# Patient Record
Sex: Female | Born: 1935 | Race: White | Hispanic: No | Smoking: Never smoker
Health system: Southern US, Community
[De-identification: ages and names within clinical notes are randomized; demographics above are authoritative.]

## PROBLEM LIST (undated history)

## (undated) DIAGNOSIS — C801 Malignant (primary) neoplasm, unspecified: Secondary | ICD-10-CM

## (undated) DIAGNOSIS — E785 Hyperlipidemia, unspecified: Secondary | ICD-10-CM

## (undated) DIAGNOSIS — H3530 Unspecified macular degeneration: Secondary | ICD-10-CM

## (undated) DIAGNOSIS — K219 Gastro-esophageal reflux disease without esophagitis: Secondary | ICD-10-CM

## (undated) DIAGNOSIS — E079 Disorder of thyroid, unspecified: Secondary | ICD-10-CM

## (undated) DIAGNOSIS — C439 Malignant melanoma of skin, unspecified: Secondary | ICD-10-CM

## (undated) HISTORY — DX: Gastro-esophageal reflux disease without esophagitis: K21.9

## (undated) HISTORY — DX: Hyperlipidemia, unspecified: E78.5

## (undated) HISTORY — DX: Malignant melanoma of skin, unspecified: C43.9

## (undated) HISTORY — DX: Malignant (primary) neoplasm, unspecified: C80.1

## (undated) HISTORY — PX: OTHER SURGICAL HISTORY: SHX169

## (undated) HISTORY — PX: BUNIONECTOMY: SHX129

## (undated) HISTORY — DX: Unspecified macular degeneration: H35.30

## (undated) HISTORY — PX: MELANOMA EXCISION: SHX5266

## (undated) HISTORY — DX: Disorder of thyroid, unspecified: E07.9

---

## 1981-01-03 HISTORY — PX: AXILLARY NODE DISSECTION: SHX1211

## 2001-05-15 ENCOUNTER — Other Ambulatory Visit: Admission: RE | Admit: 2001-05-15 | Discharge: 2001-05-15 | Payer: Self-pay | Admitting: Internal Medicine

## 2002-04-11 ENCOUNTER — Ambulatory Visit (HOSPITAL_BASED_OUTPATIENT_CLINIC_OR_DEPARTMENT_OTHER): Admission: RE | Admit: 2002-04-11 | Discharge: 2002-04-11 | Payer: Self-pay | Admitting: General Surgery

## 2002-07-01 ENCOUNTER — Other Ambulatory Visit: Admission: RE | Admit: 2002-07-01 | Discharge: 2002-07-01 | Payer: Self-pay | Admitting: Internal Medicine

## 2002-07-01 ENCOUNTER — Encounter: Admission: RE | Admit: 2002-07-01 | Discharge: 2002-07-01 | Payer: Self-pay | Admitting: Internal Medicine

## 2002-07-01 ENCOUNTER — Encounter: Payer: Self-pay | Admitting: Internal Medicine

## 2002-07-30 ENCOUNTER — Encounter: Payer: Self-pay | Admitting: Internal Medicine

## 2002-07-30 ENCOUNTER — Encounter: Admission: RE | Admit: 2002-07-30 | Discharge: 2002-07-30 | Payer: Self-pay | Admitting: Internal Medicine

## 2003-11-06 ENCOUNTER — Encounter: Admission: RE | Admit: 2003-11-06 | Discharge: 2003-11-06 | Payer: Self-pay | Admitting: Internal Medicine

## 2003-11-20 ENCOUNTER — Ambulatory Visit (HOSPITAL_COMMUNITY): Admission: RE | Admit: 2003-11-20 | Discharge: 2003-11-20 | Payer: Self-pay | Admitting: Internal Medicine

## 2005-03-27 ENCOUNTER — Encounter: Admission: RE | Admit: 2005-03-27 | Discharge: 2005-03-27 | Payer: Self-pay | Admitting: Internal Medicine

## 2005-12-13 ENCOUNTER — Other Ambulatory Visit: Admission: RE | Admit: 2005-12-13 | Discharge: 2005-12-13 | Payer: Self-pay | Admitting: Internal Medicine

## 2007-01-30 ENCOUNTER — Other Ambulatory Visit: Admission: RE | Admit: 2007-01-30 | Discharge: 2007-01-30 | Payer: Self-pay | Admitting: Internal Medicine

## 2007-05-31 ENCOUNTER — Encounter: Admission: RE | Admit: 2007-05-31 | Discharge: 2007-05-31 | Payer: Self-pay | Admitting: Internal Medicine

## 2007-06-05 ENCOUNTER — Encounter: Admission: RE | Admit: 2007-06-05 | Discharge: 2007-06-05 | Payer: Self-pay | Admitting: Internal Medicine

## 2007-10-30 ENCOUNTER — Ambulatory Visit: Payer: Self-pay | Admitting: Internal Medicine

## 2008-02-01 ENCOUNTER — Ambulatory Visit: Payer: Self-pay | Admitting: Internal Medicine

## 2008-07-29 ENCOUNTER — Ambulatory Visit: Payer: Self-pay | Admitting: Internal Medicine

## 2008-08-11 ENCOUNTER — Ambulatory Visit: Payer: Self-pay | Admitting: Internal Medicine

## 2008-10-31 ENCOUNTER — Ambulatory Visit: Payer: Self-pay | Admitting: Internal Medicine

## 2009-02-10 ENCOUNTER — Ambulatory Visit: Payer: Self-pay | Admitting: Internal Medicine

## 2009-07-15 ENCOUNTER — Ambulatory Visit: Payer: Self-pay | Admitting: Internal Medicine

## 2009-10-27 ENCOUNTER — Ambulatory Visit: Payer: Self-pay | Admitting: Internal Medicine

## 2009-12-10 ENCOUNTER — Ambulatory Visit: Payer: Self-pay | Admitting: Internal Medicine

## 2010-03-01 ENCOUNTER — Other Ambulatory Visit: Payer: Medicare Other | Admitting: Internal Medicine

## 2010-03-02 ENCOUNTER — Encounter: Payer: Self-pay | Admitting: Internal Medicine

## 2010-03-08 ENCOUNTER — Encounter (INDEPENDENT_AMBULATORY_CARE_PROVIDER_SITE_OTHER): Payer: Medicare Other | Admitting: Internal Medicine

## 2010-03-08 ENCOUNTER — Other Ambulatory Visit (HOSPITAL_COMMUNITY)
Admission: RE | Admit: 2010-03-08 | Discharge: 2010-03-08 | Disposition: A | Discharge status: Home / Self Care | Payer: Medicare Other | Source: Ambulatory Visit | Attending: Internal Medicine | Admitting: Internal Medicine

## 2010-03-08 DIAGNOSIS — K219 Gastro-esophageal reflux disease without esophagitis: Secondary | ICD-10-CM

## 2010-03-08 DIAGNOSIS — M899 Disorder of bone, unspecified: Secondary | ICD-10-CM

## 2010-03-08 DIAGNOSIS — Z124 Encounter for screening for malignant neoplasm of cervix: Secondary | ICD-10-CM | POA: Insufficient documentation

## 2010-03-08 DIAGNOSIS — M949 Disorder of cartilage, unspecified: Secondary | ICD-10-CM

## 2010-03-08 DIAGNOSIS — H8109 Meniere's disease, unspecified ear: Secondary | ICD-10-CM

## 2010-03-08 DIAGNOSIS — E785 Hyperlipidemia, unspecified: Secondary | ICD-10-CM

## 2010-03-08 LAB — HM PAP SMEAR

## 2010-03-10 LAB — HM MAMMOGRAPHY

## 2010-05-21 NOTE — Op Note (Signed)
   NAME:  Sabrina English, Sabrina English                      ACCOUNT NO.:  [id number]   MEDICAL RECORD NO.:  [id number]                   PATIENT TYPE:  AMB   LOCATION:  DSC                                  FACILITY:  [facility]   PHYSICIAN:  Rose Phi. Maple Hudson, M.D.                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   POSTOPERATIVE DIAGNOSIS:  Dysplastic melanoma of the anterior abdominal  wall.   OPERATION:  Wide excision of same.   SURGEON:  Rose Phi. Maple Hudson, M.D.   ANESTHESIA:  Local.   OPERATIVE PROCEDURE:  The patient was placed on the table and the left upper  abdomen prepped and draped in the usual fashion.  An elliptical ellipse was  then outlined with about a 1 cm margin throughout and oriented in a  transverse direction and then we infiltrated the area with a mixture of 1%  Xylocaine and sodium bicarbonate.  The excision was then carried out down  into the subcutaneous tissue and the lesion removed.  We had good  hemostasis.  It was closed with interrupted 4-0 nylon sutures.  The defect  was 5 by 3 cm.  A dressing was then applied.  The patient was transferred to  the recovery room in satisfactory condition.                                               Rose Phi. Maple Hudson, M.D.    PRY/MEDQ  D:  04/11/2002  T:  04/11/2002  Job:  [id number]

## 2010-05-24 ENCOUNTER — Telehealth: Payer: Self-pay | Admitting: *Deleted

## 2010-05-24 NOTE — Telephone Encounter (Signed)
Pt and husband are in [place] visiting family.  Pt has been running a fever of 100 to 102 degrees and coughing.  She visited an urgent care on Thursday where she started a Z-pak.  Pt has now finished antibiotic and has had no improvements in symptoms.  Instructed pt to call doctor that prescribed the antibiotic and let them know she is not feeling any better.  Encouraged her not to fly until her temperature and cough is under control.  Pt verbalized understanding. MD aware.

## 2010-05-27 ENCOUNTER — Ambulatory Visit (INDEPENDENT_AMBULATORY_CARE_PROVIDER_SITE_OTHER): Payer: Medicare Other | Admitting: Internal Medicine

## 2010-05-27 ENCOUNTER — Encounter: Payer: Self-pay | Admitting: Internal Medicine

## 2010-05-27 ENCOUNTER — Other Ambulatory Visit: Payer: Self-pay

## 2010-05-27 VITALS — BP 120/74 | HR 88 | Temp 99.5°F | Wt 155.0 lb

## 2010-05-27 DIAGNOSIS — K121 Other forms of stomatitis: Secondary | ICD-10-CM

## 2010-05-27 DIAGNOSIS — J069 Acute upper respiratory infection, unspecified: Secondary | ICD-10-CM

## 2010-05-27 DIAGNOSIS — B9789 Other viral agents as the cause of diseases classified elsewhere: Secondary | ICD-10-CM

## 2010-05-27 DIAGNOSIS — H8109 Meniere's disease, unspecified ear: Secondary | ICD-10-CM

## 2010-05-27 DIAGNOSIS — M8580 Other specified disorders of bone density and structure, unspecified site: Secondary | ICD-10-CM | POA: Insufficient documentation

## 2010-05-27 DIAGNOSIS — K219 Gastro-esophageal reflux disease without esophagitis: Secondary | ICD-10-CM | POA: Insufficient documentation

## 2010-05-27 DIAGNOSIS — E785 Hyperlipidemia, unspecified: Secondary | ICD-10-CM

## 2010-05-27 MED ORDER — LEVOFLOXACIN 500 MG PO TABS: 500.0000 mg | ORAL_TABLET | Freq: Every day | ORAL | Status: AC

## 2010-05-27 MED ORDER — METHYLPREDNISOLONE ACETATE 80 MG/ML IJ SUSP
80.0000 mg | Freq: Once | INTRAMUSCULAR | Status: AC
  Administered 2010-05-27: 80 mg via INTRAMUSCULAR

## 2010-05-27 MED ORDER — FLUCONAZOLE 150 MG PO TABS: 150.0000 mg | ORAL_TABLET | Freq: Every day | ORAL | Status: AC

## 2010-05-27 NOTE — Patient Instructions (Signed)
Take meds as directed. Return in 3 weeks to have potassium checked

## 2010-05-27 NOTE — Progress Notes (Signed)
  Subjective:    Patient ID: Sabrina English, female    DOB: 15-Sep-1933, 77 y.o.   MRN: [id number]  HPI patient was visiting her son in [place] and came down with an acute respiratory infection. Has had a lot of coughing. Was seen at an urgent care in [place] and given a Zithromax Z-Pak which she has finished. Also lab work was done there and she was told her potassium was low. She takes Dyazide for Mnire's disease. She has been on potassium supplement for 6 days. Complaint of ulcers inside lower lip. Husband is sick with similar illness.    Review of Systems     Objective:   Physical Exam HEENT exam pharynx slightly red small papules inside lower lip TMs clear neck is supple without significant adenopathy chest completely clear to auscultation. No rales or wheezing.       Assessment & Plan:    Acute respiratory infection not responding to Zithromax Z-Pak. Continued coughing and congestion. Treat with Levaquin 500 mg daily or 10 days.  Prior history of stomatitis developing with antibiotic use. Treat prophylactically with Diflucan 150 mg daily for 10 days.  Hycodan tablets #30 one tablet every 6 hours as needed for coughing.  Finish course of potassium supplement and recheck potassium level in 2-3 weeks to see if chronic replacement therapy is needed on diuretic. Also, Depo-Medrol 80 mg given intramuscularly today.

## 2010-06-18 ENCOUNTER — Other Ambulatory Visit: Payer: Self-pay | Admitting: Internal Medicine

## 2010-06-18 ENCOUNTER — Other Ambulatory Visit: Payer: Medicare Other | Admitting: Internal Medicine

## 2010-06-18 DIAGNOSIS — E876 Hypokalemia: Secondary | ICD-10-CM

## 2010-06-19 LAB — BASIC METABOLIC PANEL
BUN: 27 mg/dL — ABNORMAL HIGH (ref 6–23)
CO2: 28 mEq/L (ref 19–32)
Calcium: 9.8 mg/dL (ref 8.4–10.5)
Chloride: 104 mEq/L (ref 96–112)
Creat: 1.09 mg/dL (ref 0.50–1.10)
Glucose, Bld: 93 mg/dL (ref 70–99)
Potassium: 3.6 mEq/L (ref 3.5–5.3)
Sodium: 140 mEq/L (ref 135–145)

## 2010-09-27 ENCOUNTER — Ambulatory Visit (INDEPENDENT_AMBULATORY_CARE_PROVIDER_SITE_OTHER): Payer: Medicare Other | Admitting: Internal Medicine

## 2010-09-27 DIAGNOSIS — Z23 Encounter for immunization: Secondary | ICD-10-CM

## 2011-03-15 ENCOUNTER — Other Ambulatory Visit: Payer: Self-pay | Admitting: Internal Medicine

## 2011-03-15 DIAGNOSIS — Z1231 Encounter for screening mammogram for malignant neoplasm of breast: Secondary | ICD-10-CM | POA: Diagnosis not present

## 2011-03-22 ENCOUNTER — Encounter: Payer: Self-pay | Admitting: Internal Medicine

## 2011-04-26 ENCOUNTER — Other Ambulatory Visit: Payer: Self-pay | Admitting: Internal Medicine

## 2011-05-02 DIAGNOSIS — H2510 Age-related nuclear cataract, unspecified eye: Secondary | ICD-10-CM | POA: Diagnosis not present

## 2011-05-02 DIAGNOSIS — H353190 Nonexudative age-related macular degeneration, unspecified eye, stage unspecified: Secondary | ICD-10-CM | POA: Diagnosis not present

## 2011-06-06 ENCOUNTER — Other Ambulatory Visit: Payer: Medicare Other | Admitting: Internal Medicine

## 2011-06-07 ENCOUNTER — Encounter: Payer: Medicare Other | Admitting: Internal Medicine

## 2011-07-12 ENCOUNTER — Other Ambulatory Visit: Payer: Medicare Other | Admitting: Internal Medicine

## 2011-07-12 DIAGNOSIS — Z79899 Other long term (current) drug therapy: Secondary | ICD-10-CM | POA: Diagnosis not present

## 2011-07-12 DIAGNOSIS — E559 Vitamin D deficiency, unspecified: Secondary | ICD-10-CM

## 2011-07-12 DIAGNOSIS — M8580 Other specified disorders of bone density and structure, unspecified site: Secondary | ICD-10-CM

## 2011-07-12 DIAGNOSIS — E785 Hyperlipidemia, unspecified: Secondary | ICD-10-CM

## 2011-07-12 LAB — COMPREHENSIVE METABOLIC PANEL
ALT: 17 U/L (ref 0–35)
AST: 28 U/L (ref 0–37)
Albumin: 3.9 g/dL (ref 3.5–5.2)
Alkaline Phosphatase: 69 U/L (ref 39–117)
BUN: 27 mg/dL — ABNORMAL HIGH (ref 6–23)
CO2: 31 mEq/L (ref 19–32)
Calcium: 9.9 mg/dL (ref 8.4–10.5)
Chloride: 106 mEq/L (ref 96–112)
Creat: 1.32 mg/dL — ABNORMAL HIGH (ref 0.50–1.10)
Glucose, Bld: 85 mg/dL (ref 70–99)
Potassium: 4 mEq/L (ref 3.5–5.3)
Sodium: 144 mEq/L (ref 135–145)
Total Bilirubin: 0.7 mg/dL (ref 0.3–1.2)
Total Protein: 6.1 g/dL (ref 6.0–8.3)

## 2011-07-12 LAB — LIPID PANEL
Cholesterol: 183 mg/dL (ref 0–200)
HDL: 77 mg/dL (ref >39)
LDL Cholesterol: 89 mg/dL (ref 0–99)
Total CHOL/HDL Ratio: 2.4 Ratio
Triglycerides: 87 mg/dL (ref <150)
VLDL: 17 mg/dL (ref 0–40)

## 2011-07-12 LAB — CBC WITH DIFFERENTIAL/PLATELET
Basophils Absolute: 0 10*3/uL (ref 0.0–0.1)
Basophils Relative: 1 % (ref 0–1)
Eosinophils Absolute: 0.1 10*3/uL (ref 0.0–0.7)
Eosinophils Relative: 3 % (ref 0–5)
HCT: 36.6 % (ref 36.0–46.0)
Hemoglobin: 12.6 g/dL (ref 12.0–15.0)
Lymphocytes Relative: 39 % (ref 12–46)
Lymphs Abs: 1.5 10*3/uL (ref 0.7–4.0)
MCH: 29.2 pg (ref 26.0–34.0)
MCHC: 34.4 g/dL (ref 30.0–36.0)
MCV: 84.9 fL (ref 78.0–100.0)
Monocytes Absolute: 0.4 10*3/uL (ref 0.1–1.0)
Monocytes Relative: 11 % (ref 3–12)
Neutro Abs: 1.8 10*3/uL (ref 1.7–7.7)
Neutrophils Relative %: 46 % (ref 43–77)
Platelets: 245 10*3/uL (ref 150–400)
RBC: 4.31 MIL/uL (ref 3.87–5.11)
RDW: 13.5 % (ref 11.5–15.5)
WBC: 3.8 10*3/uL — ABNORMAL LOW (ref 4.0–10.5)

## 2011-07-12 LAB — TSH: TSH: 0.346 u[IU]/mL — ABNORMAL LOW (ref 0.350–4.500)

## 2011-07-13 LAB — VITAMIN D 25 HYDROXY (VIT D DEFICIENCY, FRACTURES): Vit D, 25-Hydroxy: 47 ng/mL (ref 30–89)

## 2011-07-14 ENCOUNTER — Encounter: Payer: Medicare Other | Admitting: Internal Medicine

## 2011-08-12 ENCOUNTER — Encounter: Payer: Medicare Other | Admitting: Internal Medicine

## 2011-08-15 DIAGNOSIS — H6120 Impacted cerumen, unspecified ear: Secondary | ICD-10-CM | POA: Diagnosis not present

## 2011-08-15 DIAGNOSIS — H903 Sensorineural hearing loss, bilateral: Secondary | ICD-10-CM | POA: Diagnosis not present

## 2011-08-15 DIAGNOSIS — H9190 Unspecified hearing loss, unspecified ear: Secondary | ICD-10-CM | POA: Diagnosis not present

## 2011-08-15 DIAGNOSIS — H61309 Acquired stenosis of external ear canal, unspecified, unspecified ear: Secondary | ICD-10-CM | POA: Diagnosis not present

## 2011-08-16 ENCOUNTER — Encounter: Payer: Self-pay | Admitting: Internal Medicine

## 2011-08-16 ENCOUNTER — Ambulatory Visit (INDEPENDENT_AMBULATORY_CARE_PROVIDER_SITE_OTHER): Payer: Medicare Other | Admitting: Internal Medicine

## 2011-08-16 VITALS — BP 102/64 | HR 80 | Temp 98.5°F | Ht 63.5 in | Wt 158.0 lb

## 2011-08-16 DIAGNOSIS — I10 Essential (primary) hypertension: Secondary | ICD-10-CM | POA: Diagnosis not present

## 2011-08-16 DIAGNOSIS — H8109 Meniere's disease, unspecified ear: Secondary | ICD-10-CM

## 2011-08-16 DIAGNOSIS — Z23 Encounter for immunization: Secondary | ICD-10-CM

## 2011-08-16 DIAGNOSIS — Z0000 Encounter for general adult medical examination without abnormal findings: Secondary | ICD-10-CM

## 2011-08-16 DIAGNOSIS — Z85820 Personal history of malignant melanoma of skin: Secondary | ICD-10-CM

## 2011-08-16 DIAGNOSIS — H3530 Unspecified macular degeneration: Secondary | ICD-10-CM

## 2011-08-16 DIAGNOSIS — M949 Disorder of cartilage, unspecified: Secondary | ICD-10-CM

## 2011-08-16 DIAGNOSIS — K219 Gastro-esophageal reflux disease without esophagitis: Secondary | ICD-10-CM

## 2011-08-16 DIAGNOSIS — E785 Hyperlipidemia, unspecified: Secondary | ICD-10-CM

## 2011-08-16 DIAGNOSIS — M8580 Other specified disorders of bone density and structure, unspecified site: Secondary | ICD-10-CM

## 2011-08-16 DIAGNOSIS — R6889 Other general symptoms and signs: Secondary | ICD-10-CM

## 2011-08-16 LAB — POCT URINALYSIS DIPSTICK
Bilirubin, UA: NEGATIVE
Blood, UA: NEGATIVE
Glucose, UA: NEGATIVE
Ketones, UA: NEGATIVE
Leukocytes, UA: NEGATIVE
Nitrite, UA: NEGATIVE
Spec Grav, UA: 1.02
Urobilinogen, UA: NEGATIVE
pH, UA: 6

## 2011-08-16 LAB — BASIC METABOLIC PANEL
BUN: 30 mg/dL — ABNORMAL HIGH (ref 6–23)
CO2: 31 mEq/L (ref 19–32)
Calcium: 9.8 mg/dL (ref 8.4–10.5)
Chloride: 105 mEq/L (ref 96–112)
Creat: 1.24 mg/dL — ABNORMAL HIGH (ref 0.50–1.10)
Glucose, Bld: 86 mg/dL (ref 70–99)
Potassium: 4 mEq/L (ref 3.5–5.3)
Sodium: 142 mEq/L (ref 135–145)

## 2011-08-16 MED ORDER — TETANUS-DIPHTH-ACELL PERTUSSIS 5-2.5-18.5 LF-MCG/0.5 IM SUSP: 0.5000 mL | Freq: Once | INTRAMUSCULAR | Status: DC

## 2011-08-16 NOTE — Progress Notes (Signed)
Subjective:    Patient ID: Sabrina English, female    DOB: 1933/02/18, 78 y.o.   MRN: [id number]  HPI 78 year old White female with history of Meniere's disease, hearing loss but not using hearing aids, macular degeneration, GERD, hyperlipidemia, osteopenia for evaluation of medical problems and health maintenance. And patient is on Zocor, Lipoflavinoid for Mnire's disease and Dyazide for Mnire's disease. Takes meclizine when necessary for vertigo. History of melanoma left shoulder 1982 treated at [facility] with immune therapy per Dr. Leticia Penna. History of C7 radiculopathy. History of left bunionectomy 1996. Sees Dr. Janae Bridgeman at [facility] for Meniere's disease. Medical issues are stable and health is generally good.  Social history: she is married 3 adult children 2 daughters and a son. Husband is a retired Airline pilot. 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 Does not smoke or consume alcohol.  Family history: Father died at 67 of prostate cancer;  mother died at age 54 of Alzheimer's disease complications. Half sister died at age 60 with chronic lung disease. One sister died of colon cancer at age 57. One half brother in good health. Younger brother in fair health.  No known drug allergies. History of osteopenia and took Fosamax for a while but no longer does so.  Had shingles vaccine 07/29/2008, Pneumovax immunization February 2011, gets annual influenza immunization.    Review of Systems  Constitutional: Negative.   HENT: Positive for tinnitus.   Eyes:       Macular degeneration  Respiratory: Negative.   Cardiovascular: Negative.   Gastrointestinal:       GERD at night sometimes  Genitourinary: Negative.   Musculoskeletal: Negative.   Neurological: Negative.   Hematological: Negative.   Psychiatric/Behavioral: Negative.        Objective:   Physical Exam  Vitals reviewed. Constitutional: She is oriented to person, place, and time. She appears  well-developed and well-nourished. No distress.  HENT:  Head: Normocephalic and atraumatic.  Right Ear: External ear normal.  Left Ear: External ear normal.  Mouth/Throat: Oropharynx is clear and moist. No oropharyngeal exudate.  Eyes: Conjunctivae normal and EOM are normal. Pupils are equal, round, and reactive to light. Right eye exhibits no discharge. Left eye exhibits no discharge.  Neck: Neck supple. No JVD present. No thyromegaly present.  Cardiovascular: Normal rate, regular rhythm, normal heart sounds and intact distal pulses.   No murmur heard. Pulmonary/Chest: Effort normal and breath sounds normal. No respiratory distress. She has no wheezes. She has no rales. She exhibits no tenderness.       Breasts normal female  Abdominal: Soft. Bowel sounds are normal. She exhibits no distension and no mass. There is no tenderness. There is no rebound and no guarding.  Musculoskeletal: Normal range of motion. She exhibits no edema.  Neurological: She is alert and oriented to person, place, and time. She has normal reflexes. No cranial nerve deficit. Coordination normal.  Skin: Skin is warm and dry. No rash noted. She is not diaphoretic.  Psychiatric: She has a normal mood and affect. Her behavior is normal. Judgment and thought content normal.          Assessment & Plan:            Subjective:   Patient presents for Medicare Annual/Subsequent preventive examination.  Review Past  History See Epic   Risk Factors  Current exercise habits: walks occasionally Dietary issues discussed: Low carb low fat  Cardiac risk factors:  Depression Screen  (  Note: if answer to either of the following is "Yes", a more complete depression screening is indicated)   Over the past two weeks, have you felt down, depressed or hopeless? No  Over the past two weeks, have you felt little interest or pleasure in doing things? No Have you lost interest or pleasure in daily life? No Do you often  feel hopeless? No Do you cry easily over simple problems? No   Activities of Daily Living  In your present state of health, do you have any difficulty performing the following activities?:   Driving? No  Managing money? No  Feeding yourself? No  Getting from bed to chair? No  Climbing a flight of stairs? No  Preparing food and eating?: No  Bathing or showering? No  Getting dressed: No  Getting to the toilet? No  Using the toilet:No  Moving around from place to place: No  In the past year have you fallen or had a near fall?:No  Are you sexually active? yes Do you have more than one partner? No   Hearing Difficulties: No  Do you often ask people to speak up or repeat themselves? yes Do you experience ringing or noises in your ears? some Do you have difficulty understanding soft or whispered voices? yes Do you feel that you have a problem with memory? No Do you often misplace items? No    Home Safety:  Do you have a smoke alarm at your residence? Yes Do you have grab bars in the bathroom? No Do you have throw rugs in your house? No   Cognitive Testing  Alert? Yes Normal Appearance?Yes  Oriented to person? Yes Place? Yes  Time? Yes  Recall of three objects? Yes  Can perform simple calculations? Yes  Displays appropriate judgment?Yes  Can read the correct time from a watch face?Yes   List the Names of Other Physician/Practitioners you currently use:  See referral list for the physicians patient is currently seeing. Dr. Janae Bridgeman  at [facility] for Meniere's disease and hearing loss. Opthamologist is Dr. Eulah Pont    Review of Systems:   Objective:     General appearance: Appears stated age  Head: Normocephalic, without obvious abnormality, atraumatic  Eyes: conj clear, EOMi PEERLA  Ears: normal TM's and external ear canals both ears  Nose: Nares normal. Septum midline. Mucosa normal. No drainage or sinus tenderness.  Throat: lips, mucosa, and  tongue normal; teeth and gums normal  Neck: no adenopathy, no carotid bruit, no JVD, supple, symmetrical, trachea midline and thyroid not enlarged, symmetric, no tenderness/mass/nodules  No CVA tenderness.  Lungs: clear to auscultation bilaterally  Breasts: normal appearance, no masses or tenderness,  Heart: regular rate and rhythm, S1, S2 normal, no murmur, click, rub or gallop  Abdomen: soft, non-tender; bowel sounds normal; no masses, no organomegaly  Musculoskeletal: ROM normal in all joints, no crepitus, no deformity, Normal muscle strengthen. Back  is symmetric, no curvature. Skin: Skin color, texture, turgor normal. No rashes or lesions  Lymph nodes: Cervical, supraclavicular, and axillary nodes normal.  Neurologic: CN 2 -12 Normal, Normal symmetric reflexes. Normal coordination and gait  Psych: Alert & Oriented x 3, Mood appear stable.    Assessment:    Annual wellness medicare exam   Plan:    During the course of the visit the patient was educated and counseled about appropriate screening and preventive services including: Annual mammography. Colonoscopy done 04/19/2006 and last Pap smear 03/09/2010  Patient Instructions (the written plan) was given to the patient.  Medicare Attestation  I have personally reviewed:  The patient's medical and social history  Their use of alcohol, tobacco or illicit drugs  Their current medications and supplements  The patient's functional ability including ADLs,fall risks, home safety risks, cognitive, and hearing and visual impairment  Diet and physical activities  Evidence for depression or mood disorders  The patient's weight, height, BMI, and visual acuity have been recorded in the chart. I have made referrals, counseling, and provided education to the patient based on review of the above and I have provided the patient with a written personalized care plan for preventive services.

## 2011-08-30 DIAGNOSIS — M899 Disorder of bone, unspecified: Secondary | ICD-10-CM | POA: Diagnosis not present

## 2011-08-30 DIAGNOSIS — M949 Disorder of cartilage, unspecified: Secondary | ICD-10-CM | POA: Diagnosis not present

## 2011-10-05 ENCOUNTER — Ambulatory Visit (INDEPENDENT_AMBULATORY_CARE_PROVIDER_SITE_OTHER): Payer: Medicare Other | Admitting: Internal Medicine

## 2011-10-05 DIAGNOSIS — Z23 Encounter for immunization: Secondary | ICD-10-CM | POA: Diagnosis not present

## 2011-12-11 ENCOUNTER — Encounter: Payer: Self-pay | Admitting: Internal Medicine

## 2011-12-11 NOTE — Patient Instructions (Addendum)
Continue same medications return in 6 months for office visit lipid panel liver functions in followup

## 2011-12-30 DIAGNOSIS — H353190 Nonexudative age-related macular degeneration, unspecified eye, stage unspecified: Secondary | ICD-10-CM | POA: Diagnosis not present

## 2012-01-16 ENCOUNTER — Encounter: Payer: Self-pay | Admitting: Internal Medicine

## 2012-01-16 ENCOUNTER — Ambulatory Visit (INDEPENDENT_AMBULATORY_CARE_PROVIDER_SITE_OTHER): Payer: Medicare Other | Admitting: Internal Medicine

## 2012-01-16 VITALS — BP 126/66 | HR 74 | Temp 98.0°F | Wt 160.0 lb

## 2012-01-16 DIAGNOSIS — IMO0002 Reserved for concepts with insufficient information to code with codable children: Secondary | ICD-10-CM

## 2012-01-16 DIAGNOSIS — L600 Ingrowing nail: Secondary | ICD-10-CM | POA: Diagnosis not present

## 2012-01-16 NOTE — Patient Instructions (Addendum)
Take Keflex 500 mg 3 times daily for 5 days. Soak toe in warm Epsom salt water for 20 minutes daily. Return on Friday, January 17

## 2012-01-16 NOTE — Progress Notes (Signed)
  Subjective:    Patient ID: Sabrina English, female    DOB: 06-23-1933, 79 y.o.   MRN: [id number]  HPI Painful right great toenail medial aspect. She has been cutting her toenails too short at the edges. Toe has been painful for one month. No purulent drainage noted.    Review of Systems     Objective:   Physical Exam erythema right great toe medial aspect. Very tender to touch. No drainage noted. Cotton inserted under medial aspect of right great toe nail.        Assessment & Plan:  Ingrown right great toenail  Early paronychia right great toe  Plan: Keflex 500 mg 3 times a day for 5 days. Norco 5/325 (#30) 1 by mouth every 8 hours when necessary pain. Take with food. Soak toe in warm Epsom salt water for 20 minutes daily. Return Friday January 17. Says she cannot get the cotton under her toenail herself so I will recheck it then.

## 2012-01-20 ENCOUNTER — Ambulatory Visit (INDEPENDENT_AMBULATORY_CARE_PROVIDER_SITE_OTHER): Payer: Medicare Other | Admitting: Internal Medicine

## 2012-01-20 ENCOUNTER — Encounter: Payer: Self-pay | Admitting: Internal Medicine

## 2012-01-20 VITALS — BP 120/74 | Temp 97.8°F | Wt 160.0 lb

## 2012-01-20 DIAGNOSIS — IMO0002 Reserved for concepts with insufficient information to code with codable children: Secondary | ICD-10-CM | POA: Diagnosis not present

## 2012-01-20 DIAGNOSIS — L600 Ingrowing nail: Secondary | ICD-10-CM

## 2012-01-20 NOTE — Progress Notes (Signed)
  Subjective:    Patient ID: Sabrina English, female    DOB: [birth year], 79 y.o.   MRN: [id number]  HPI followup visit from January 13. Was diagnosed with paronychia and ingrown toenail right great toe. Is doing better. His been on Keflex 500 mg 3 times daily. Less tenderness of right great toe. Cotton still under toe. More cotton inserted up under medial aspect right great toe nail. Some pus was expressed     Review of Systems     Objective:   Physical Exam see above. Less erythema of toe.        Assessment & Plan:    Ingrown toenail right great toe    Paronychia  Plan: continue an additional 5 days of Keflex 500 mg by mouth 3 times a day to complete a ten-day course. She has not been soaking it in warm Epsom salt water as previously advised so I expect her to do that for 20 minutes daily. Call if not better next week or if needs more cotton inserted under nail. Cotton needs to stay under nail under nail has grown out which could be some time. She is reluctant to put the cotton under the nail herself right now.

## 2012-01-20 NOTE — Patient Instructions (Addendum)
Continue Keflex 500 mg 3 times daily for an additional 5 days. Soak right great toe in warm Epsom salt water for 20 minutes daily for 5-7 days. Continue to keep cotton under ingrown toe nail area until nail has grown out.

## 2012-01-24 ENCOUNTER — Telehealth: Payer: Self-pay | Admitting: Internal Medicine

## 2012-02-20 DIAGNOSIS — H61309 Acquired stenosis of external ear canal, unspecified, unspecified ear: Secondary | ICD-10-CM | POA: Diagnosis not present

## 2012-02-20 DIAGNOSIS — H903 Sensorineural hearing loss, bilateral: Secondary | ICD-10-CM | POA: Diagnosis not present

## 2012-02-20 DIAGNOSIS — H6120 Impacted cerumen, unspecified ear: Secondary | ICD-10-CM | POA: Diagnosis not present

## 2012-02-20 DIAGNOSIS — H9190 Unspecified hearing loss, unspecified ear: Secondary | ICD-10-CM | POA: Diagnosis not present

## 2012-03-15 ENCOUNTER — Other Ambulatory Visit: Payer: Self-pay

## 2012-03-15 MED ORDER — TRIAMTERENE-HCTZ 37.5-25 MG PO CAPS: 1.0000 | ORAL_CAPSULE | Freq: Every day | ORAL | Status: DC

## 2012-04-17 ENCOUNTER — Other Ambulatory Visit: Payer: Self-pay | Admitting: Internal Medicine

## 2012-08-20 ENCOUNTER — Other Ambulatory Visit: Payer: Medicare Other | Admitting: Internal Medicine

## 2012-08-20 DIAGNOSIS — Z130 Encounter for screening for diseases of the blood and blood-forming organs and certain disorders involving the immune mechanism: Secondary | ICD-10-CM | POA: Diagnosis not present

## 2012-08-20 DIAGNOSIS — M899 Disorder of bone, unspecified: Secondary | ICD-10-CM | POA: Diagnosis not present

## 2012-08-20 DIAGNOSIS — E785 Hyperlipidemia, unspecified: Secondary | ICD-10-CM | POA: Diagnosis not present

## 2012-08-20 DIAGNOSIS — Z1329 Encounter for screening for other suspected endocrine disorder: Secondary | ICD-10-CM

## 2012-08-20 DIAGNOSIS — Z0000 Encounter for general adult medical examination without abnormal findings: Secondary | ICD-10-CM

## 2012-08-20 DIAGNOSIS — M8580 Other specified disorders of bone density and structure, unspecified site: Secondary | ICD-10-CM

## 2012-08-20 LAB — COMPREHENSIVE METABOLIC PANEL
ALT: 14 U/L (ref 0–35)
AST: 21 U/L (ref 0–37)
Albumin: 3.6 g/dL (ref 3.5–5.2)
Alkaline Phosphatase: 65 U/L (ref 39–117)
BUN: 30 mg/dL — ABNORMAL HIGH (ref 6–23)
CO2: 30 mEq/L (ref 19–32)
Calcium: 9.4 mg/dL (ref 8.4–10.5)
Chloride: 106 mEq/L (ref 96–112)
Creat: 1.2 mg/dL — ABNORMAL HIGH (ref 0.50–1.10)
Glucose, Bld: 94 mg/dL (ref 70–99)
Potassium: 3.7 mEq/L (ref 3.5–5.3)
Sodium: 142 mEq/L (ref 135–145)
Total Bilirubin: 0.7 mg/dL (ref 0.3–1.2)
Total Protein: 5.7 g/dL — ABNORMAL LOW (ref 6.0–8.3)

## 2012-08-20 LAB — LIPID PANEL
Cholesterol: 170 mg/dL (ref 0–200)
HDL: 75 mg/dL (ref >39)
LDL Cholesterol: 78 mg/dL (ref 0–99)
Total CHOL/HDL Ratio: 2.3 Ratio
Triglycerides: 86 mg/dL (ref <150)
VLDL: 17 mg/dL (ref 0–40)

## 2012-08-20 LAB — CBC WITH DIFFERENTIAL/PLATELET
Basophils Absolute: 0 10*3/uL (ref 0.0–0.1)
Basophils Relative: 1 % (ref 0–1)
Eosinophils Absolute: 0.1 10*3/uL (ref 0.0–0.7)
Eosinophils Relative: 2 % (ref 0–5)
HCT: 35.5 % — ABNORMAL LOW (ref 36.0–46.0)
Hemoglobin: 12.1 g/dL (ref 12.0–15.0)
Lymphocytes Relative: 40 % (ref 12–46)
Lymphs Abs: 1.8 10*3/uL (ref 0.7–4.0)
MCH: 28.8 pg (ref 26.0–34.0)
MCHC: 34.1 g/dL (ref 30.0–36.0)
MCV: 84.5 fL (ref 78.0–100.0)
Monocytes Absolute: 0.5 10*3/uL (ref 0.1–1.0)
Monocytes Relative: 10 % (ref 3–12)
Neutro Abs: 2.2 10*3/uL (ref 1.7–7.7)
Neutrophils Relative %: 47 % (ref 43–77)
Platelets: 237 10*3/uL (ref 150–400)
RBC: 4.2 MIL/uL (ref 3.87–5.11)
RDW: 13.4 % (ref 11.5–15.5)
WBC: 4.6 10*3/uL (ref 4.0–10.5)

## 2012-08-20 LAB — TSH: TSH: 0.361 u[IU]/mL (ref 0.350–4.500)

## 2012-08-21 ENCOUNTER — Encounter: Payer: Self-pay | Admitting: Internal Medicine

## 2012-08-21 ENCOUNTER — Ambulatory Visit (INDEPENDENT_AMBULATORY_CARE_PROVIDER_SITE_OTHER): Payer: Medicare Other | Admitting: Internal Medicine

## 2012-08-21 VITALS — BP 124/76 | HR 72 | Temp 98.5°F | Ht 63.5 in | Wt 155.0 lb

## 2012-08-21 DIAGNOSIS — M899 Disorder of bone, unspecified: Secondary | ICD-10-CM | POA: Diagnosis not present

## 2012-08-21 DIAGNOSIS — E785 Hyperlipidemia, unspecified: Secondary | ICD-10-CM | POA: Diagnosis not present

## 2012-08-21 DIAGNOSIS — M8580 Other specified disorders of bone density and structure, unspecified site: Secondary | ICD-10-CM

## 2012-08-21 DIAGNOSIS — Z85820 Personal history of malignant melanoma of skin: Secondary | ICD-10-CM | POA: Diagnosis not present

## 2012-08-21 DIAGNOSIS — Z0000 Encounter for general adult medical examination without abnormal findings: Secondary | ICD-10-CM | POA: Diagnosis not present

## 2012-08-21 DIAGNOSIS — H3530 Unspecified macular degeneration: Secondary | ICD-10-CM

## 2012-08-21 DIAGNOSIS — Z8669 Personal history of other diseases of the nervous system and sense organs: Secondary | ICD-10-CM

## 2012-08-21 DIAGNOSIS — H9190 Unspecified hearing loss, unspecified ear: Secondary | ICD-10-CM

## 2012-08-21 LAB — POCT URINALYSIS DIPSTICK
Ketones, UA: NEGATIVE
Leukocytes, UA: NEGATIVE
Nitrite, UA: NEGATIVE
Protein, UA: NEGATIVE

## 2012-08-21 MED ORDER — MECLIZINE HCL 25 MG PO TABS: 25.0000 mg | ORAL_TABLET | Freq: Three times a day (TID) | ORAL | Status: DC | PRN

## 2012-08-21 MED ORDER — DICYCLOMINE HCL 20 MG PO TABS: 20.0000 mg | ORAL_TABLET | Freq: Four times a day (QID) | ORAL | Status: DC

## 2012-08-21 NOTE — Progress Notes (Signed)
Subjective:    Patient ID: Sabrina English, female    DOB: 1933-06-09, 79 y.o.   MRN: [id number]  HPI  79 year old White female for health maintanance and evaluation of medical problems. History of Meniere's disease , GE reflux, hyperlipidemia,remote history of melanoma, macular degeneration and osteopenia. Sometimes has feeling of esophageal spasm when eating. Food may get hung up but eventually eases. Pt should consider GI evaluation for this.  No known drug allergies.  Takes Zocor for hyperlipidemia, calcium and vitamin D supplements for osteopenia, Maxide 25 for Mnire's disease. Takes when necessary meclizine for vertigo. She takes baby aspirin daily. Takes a potassium supplement because she is on diuretic for Mnire's disease. She has hearing loss but does not use hearing aids.  Patient had melanoma left shoulder 1992 treated at [facility] with immunotherapy per Dr. Leticia Penna.  History of C7 radiculopathy. History of left bunionectomy 1996. Sees Dr. Janae Bridgeman at [facility] for Mnire's disease.  Social history: She is married, has 3 adult children, 2 daughters and a son. Husband is a retired Airline pilot. 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 She does not smoke or consume alcohol.  Family history: Father died at 1 of prostate cancer. Mother died at age 55 of Alzheimer's disease complications. Half-sister died at age 13 with chronic lung disease. One sister died of colon cancer at age 53. One half brother in good health. Younger brother in fair health.  Used to take Fosamax for osteopenia but no longer does so.  Zostavax vaccine done July 2010, Pneumovax immunization February 2011, gets annual influenza immunization.       Review of Systems  HENT:       Macular degeneration and hearing loss  Gastrointestinal:       Some swallowing difficulty recently. History of GE reflux.  Skin:       History of melanoma 1992  Neurological:   History of Mnire's disease       Objective:   Physical Exam  Vitals reviewed. Constitutional: She is oriented to person, place, and time. She appears well-developed and well-nourished. No distress.  HENT:  Head: Normocephalic and atraumatic.  Right Ear: External ear normal.  Left Ear: External ear normal.  Mouth/Throat: Oropharynx is clear and moist.  Eyes: Conjunctivae and EOM are normal. Pupils are equal, round, and reactive to light. Right eye exhibits no discharge. Left eye exhibits no discharge. No scleral icterus.  Neck: Neck supple. No JVD present. No tracheal deviation present. No thyromegaly present.  Cardiovascular: Normal rate, regular rhythm, normal heart sounds and intact distal pulses.   No murmur heard. Pulmonary/Chest: Effort normal and breath sounds normal. No respiratory distress. She has no wheezes. She has no rales. She exhibits no tenderness.  Breasts normal female  Abdominal: Soft. Bowel sounds are normal. She exhibits no distension and no mass. There is no tenderness. There is no rebound and no guarding.  Genitourinary:  Pap not done. Bimanual normal.  Musculoskeletal: Normal range of motion.  Lymphadenopathy:    She has no cervical adenopathy.  Neurological: She is alert and oriented to person, place, and time. She has normal reflexes. No cranial nerve deficit. Coordination normal.   hard of hearing  Skin: Skin is warm and dry. No rash noted. She is not diaphoretic.  Psychiatric: She has a normal mood and affect. Her behavior is normal. Judgment and thought content normal.          Assessment &  Plan:   Hearing loss-does not wear hearing aids  Mnire's disease-stable  Osteopenia-stable  History of macular degeneration  Remote history of melanoma  Hyperlipidemia-on statin therapy  Plan: Return in 6 months for office visit, lipid panel liver functions.      Subjective:   Patient presents for Medicare Annual/Subsequent preventive  examination.   Review Past Medical/Family/Social: no change   Risk Factors  Current exercise habits: Silver Sneakers recently joined Dietary issues discussed: low fat low carb  Cardiac risk factors: see EPIC above  Depression Screen  (Note: if answer to either of the following is "Yes", a more complete depression screening is indicated)   Over the past two weeks, have you felt down, depressed or hopeless? No  Over the past two weeks, have you felt little interest or pleasure in doing things? No Have you lost interest or pleasure in daily life? No Do you often feel hopeless? No Do you cry easily over simple problems? No   Activities of Daily Living  In your present state of health, do you have any difficulty performing the following activities?:   Driving? No  Managing money? No  Feeding yourself? No  Getting from bed to chair? No  Climbing a flight of stairs? No  Preparing food and eating?: No  Bathing or showering? No  Getting dressed: No  Getting to the toilet? No  Using the toilet:No  Moving around from place to place: No  In the past year have you fallen or had a near fall?:No  Are you sexually active? No  Do you have more than one partner? No   Hearing Difficulties: No  Do you often ask people to speak up or repeat themselves? yes Do you experience ringing or noises in your ears? yes Do you have difficulty understanding soft or whispered voices? yes Do you feel that you have a problem with memory? No Do you often misplace items? No    Home Safety:  Do you have a smoke alarm at your residence? Yes Do you have grab bars in the bathroom? no Do you have throw rugs in your house? no   Cognitive Testing  Alert? Yes Normal Appearance?Yes  Oriented to person? Yes Place? Yes  Time? Yes  Recall of three objects? Yes  Can perform simple calculations? Yes  Displays appropriate judgment?Yes  Can read the correct time from a watch face?Yes   List the Names of Other  Physician/Practitioners you currently use:  See referral list for the physicians patient is currently seeing. Dr. Eulah Pont for macular degenration, Dr. Janae Bridgeman for ENT    Review of Systems: see above   Objective:     General appearance: Appears stated age Head: Normocephalic, without obvious abnormality, atraumatic  Eyes: conj clear, EOMi PEERLA  Ears: normal TM's and external ear canals both ears  Nose: Nares normal. Septum midline. Mucosa normal. No drainage or sinus tenderness.  Throat: lips, mucosa, and tongue normal; teeth and gums normal  Neck: no adenopathy, no carotid bruit, no JVD, supple, symmetrical, trachea midline and thyroid not enlarged, symmetric, no tenderness/mass/nodules  No CVA tenderness.  Lungs: clear to auscultation bilaterally  Breasts: normal appearance, no masses or tenderness. Heart: regular rate and rhythm, S1, S2 normal, no murmur, click, rub or gallop  Abdomen: soft, non-tender; bowel sounds normal; no masses, no organomegaly  Musculoskeletal: ROM normal in all joints, no crepitus, no deformity, Normal muscle strengthen. Back  is symmetric, no curvature. Skin: Skin color, texture, turgor normal. No rashes or  lesions  Lymph nodes: Cervical, supraclavicular, and axillary nodes normal.  Neurologic: CN 2 -12 Normal, Normal symmetric reflexes. Normal coordination and gait  Psych: Alert & Oriented x 3, Mood appear stable.    Assessment:    Annual wellness medicare exam   Plan:    During the course of the visit the patient was educated and counseled about appropriate screening and preventive services including:   Mammogram done     Patient Instructions (the written plan) was given to the patient.  Medicare Attestation  I have personally reviewed:  The patient's medical and social history  Their use of alcohol, tobacco or illicit drugs  Their current medications and supplements  The patient's functional ability including ADLs,fall risks, home safety  risks, cognitive, and hearing and visual impairment  Diet and physical activities  Evidence for depression or mood disorders  The patient's weight, height, BMI, and visual acuity have been recorded in the chart. I have made referrals, counseling, and provided education to the patient based on review of the above and I have provided the patient with a written personalized care plan for preventive services.

## 2012-08-24 DIAGNOSIS — H353190 Nonexudative age-related macular degeneration, unspecified eye, stage unspecified: Secondary | ICD-10-CM | POA: Diagnosis not present

## 2012-10-05 ENCOUNTER — Ambulatory Visit (INDEPENDENT_AMBULATORY_CARE_PROVIDER_SITE_OTHER): Payer: Medicare Other | Admitting: Internal Medicine

## 2012-10-05 DIAGNOSIS — Z23 Encounter for immunization: Secondary | ICD-10-CM

## 2012-10-10 ENCOUNTER — Ambulatory Visit: Payer: Medicare Other | Admitting: Internal Medicine

## 2012-10-22 DIAGNOSIS — H903 Sensorineural hearing loss, bilateral: Secondary | ICD-10-CM | POA: Diagnosis not present

## 2012-10-22 DIAGNOSIS — H61309 Acquired stenosis of external ear canal, unspecified, unspecified ear: Secondary | ICD-10-CM | POA: Diagnosis not present

## 2012-10-22 DIAGNOSIS — H918X9 Other specified hearing loss, unspecified ear: Secondary | ICD-10-CM | POA: Diagnosis not present

## 2012-10-22 DIAGNOSIS — H6120 Impacted cerumen, unspecified ear: Secondary | ICD-10-CM | POA: Diagnosis not present

## 2013-02-03 NOTE — Patient Instructions (Addendum)
Continue same medications and return in 6 months. Consider upper GI for swallowing difficulty.

## 2013-02-12 DIAGNOSIS — M21619 Bunion of unspecified foot: Secondary | ICD-10-CM | POA: Diagnosis not present

## 2013-02-12 DIAGNOSIS — M205X9 Other deformities of toe(s) (acquired), unspecified foot: Secondary | ICD-10-CM | POA: Diagnosis not present

## 2013-02-14 DIAGNOSIS — H353190 Nonexudative age-related macular degeneration, unspecified eye, stage unspecified: Secondary | ICD-10-CM | POA: Diagnosis not present

## 2013-02-14 DIAGNOSIS — H2510 Age-related nuclear cataract, unspecified eye: Secondary | ICD-10-CM | POA: Diagnosis not present

## 2013-03-12 ENCOUNTER — Encounter: Payer: Self-pay | Admitting: Internal Medicine

## 2013-03-12 ENCOUNTER — Ambulatory Visit (INDEPENDENT_AMBULATORY_CARE_PROVIDER_SITE_OTHER): Payer: Medicare Other | Admitting: Internal Medicine

## 2013-03-12 VITALS — BP 112/78 | HR 88 | Temp 98.9°F | Wt 144.0 lb

## 2013-03-12 DIAGNOSIS — J069 Acute upper respiratory infection, unspecified: Secondary | ICD-10-CM | POA: Diagnosis not present

## 2013-03-12 DIAGNOSIS — F4321 Adjustment disorder with depressed mood: Secondary | ICD-10-CM

## 2013-03-12 MED ORDER — LEVOFLOXACIN 500 MG PO TABS: 500.0000 mg | ORAL_TABLET | Freq: Every day | ORAL | Status: DC

## 2013-03-12 MED ORDER — TRIAMTERENE-HCTZ 37.5-25 MG PO CAPS: 1.0000 | ORAL_CAPSULE | Freq: Every day | ORAL | Status: DC

## 2013-03-12 MED ORDER — FLUCONAZOLE 150 MG PO TABS
150.0000 mg | ORAL_TABLET | Freq: Once | ORAL | Status: DC
Start: 2013-03-12 — End: 2014-03-10

## 2013-03-12 NOTE — Progress Notes (Signed)
   Subjective:    Patient ID: Sabrina English, female    DOB: [birth year], 80 y.o.   MRN: [id number]  HPI Onset March 5th of URI symptoms. Has had fever up to 100. No shaking chills. No myalgias. Has had cough with thick white sputum production. No sore throat. Husband passed away a few months ago. Still grieving. Not sleeping well. Tearful in the office today. History of stomatitis when taking antibiotics. Last URI on file 2012.    Review of Systems     Objective:   Physical Exam Skin warm and dry. Nodes none. Hard of hearing. Pharynx is clear. TMs obscured by cerumen. Neck is supple. Chest clear without rales or wheezing.        Assessment & Plan:  Acute URI  Grief reaction  Plan: Levaquin 500 milligrams daily for 7 days. Diflucan 150 mg tablet one time dose to take if develops stomatitis while on antibiotics. Has Hycodan at home for cough.

## 2013-03-12 NOTE — Patient Instructions (Addendum)
Take Levaquin as directed x 7 days. Take Diflucan if needed for stomatitis from antibiotic. Take Hycodan as needed for cough.

## 2013-03-21 ENCOUNTER — Other Ambulatory Visit: Payer: Self-pay

## 2013-03-21 MED ORDER — SIMVASTATIN 20 MG PO TABS: 20.0000 mg | ORAL_TABLET | Freq: Every day | ORAL | Status: DC

## 2013-04-04 DIAGNOSIS — Z1231 Encounter for screening mammogram for malignant neoplasm of breast: Secondary | ICD-10-CM | POA: Diagnosis not present

## 2013-05-16 DIAGNOSIS — M24549 Contracture, unspecified hand: Secondary | ICD-10-CM | POA: Diagnosis not present

## 2013-05-16 DIAGNOSIS — M2010 Hallux valgus (acquired), unspecified foot: Secondary | ICD-10-CM | POA: Diagnosis not present

## 2013-05-16 DIAGNOSIS — G8918 Other acute postprocedural pain: Secondary | ICD-10-CM | POA: Diagnosis not present

## 2013-05-16 DIAGNOSIS — M21619 Bunion of unspecified foot: Secondary | ICD-10-CM | POA: Diagnosis not present

## 2013-05-16 DIAGNOSIS — M2040 Other hammer toe(s) (acquired), unspecified foot: Secondary | ICD-10-CM | POA: Diagnosis not present

## 2013-05-16 DIAGNOSIS — M205X9 Other deformities of toe(s) (acquired), unspecified foot: Secondary | ICD-10-CM | POA: Diagnosis not present

## 2013-06-24 DIAGNOSIS — H6120 Impacted cerumen, unspecified ear: Secondary | ICD-10-CM | POA: Diagnosis not present

## 2013-06-24 DIAGNOSIS — H903 Sensorineural hearing loss, bilateral: Secondary | ICD-10-CM | POA: Diagnosis not present

## 2013-06-24 DIAGNOSIS — H9319 Tinnitus, unspecified ear: Secondary | ICD-10-CM | POA: Diagnosis not present

## 2013-06-24 DIAGNOSIS — H61309 Acquired stenosis of external ear canal, unspecified, unspecified ear: Secondary | ICD-10-CM | POA: Diagnosis not present

## 2013-06-24 DIAGNOSIS — H918X9 Other specified hearing loss, unspecified ear: Secondary | ICD-10-CM | POA: Diagnosis not present

## 2013-08-15 DIAGNOSIS — H353190 Nonexudative age-related macular degeneration, unspecified eye, stage unspecified: Secondary | ICD-10-CM | POA: Diagnosis not present

## 2013-09-26 DIAGNOSIS — H5315 Visual distortions of shape and size: Secondary | ICD-10-CM | POA: Diagnosis not present

## 2013-09-26 DIAGNOSIS — H259 Unspecified age-related cataract: Secondary | ICD-10-CM | POA: Diagnosis not present

## 2013-09-26 DIAGNOSIS — H3530 Unspecified macular degeneration: Secondary | ICD-10-CM | POA: Diagnosis not present

## 2013-09-26 DIAGNOSIS — H43819 Vitreous degeneration, unspecified eye: Secondary | ICD-10-CM | POA: Diagnosis not present

## 2013-09-30 DIAGNOSIS — M2010 Hallux valgus (acquired), unspecified foot: Secondary | ICD-10-CM | POA: Diagnosis not present

## 2013-09-30 DIAGNOSIS — M21619 Bunion of unspecified foot: Secondary | ICD-10-CM | POA: Diagnosis not present

## 2013-10-01 DIAGNOSIS — H2510 Age-related nuclear cataract, unspecified eye: Secondary | ICD-10-CM | POA: Diagnosis not present

## 2013-10-01 DIAGNOSIS — H25049 Posterior subcapsular polar age-related cataract, unspecified eye: Secondary | ICD-10-CM | POA: Diagnosis not present

## 2013-10-01 DIAGNOSIS — H5700 Unspecified anomaly of pupillary function: Secondary | ICD-10-CM | POA: Diagnosis not present

## 2013-10-01 DIAGNOSIS — H25039 Anterior subcapsular polar age-related cataract, unspecified eye: Secondary | ICD-10-CM | POA: Diagnosis not present

## 2013-10-23 ENCOUNTER — Telehealth: Payer: Self-pay

## 2013-10-23 ENCOUNTER — Ambulatory Visit (INDEPENDENT_AMBULATORY_CARE_PROVIDER_SITE_OTHER): Payer: Medicare Other | Admitting: Internal Medicine

## 2013-10-23 DIAGNOSIS — Z23 Encounter for immunization: Secondary | ICD-10-CM | POA: Diagnosis not present

## 2013-10-23 NOTE — Telephone Encounter (Signed)
Patient was wondering if she needed prevnar 13.  She had 23 in 2011.  Please advise.

## 2013-10-23 NOTE — Telephone Encounter (Signed)
May come get Prevnar.

## 2013-10-24 NOTE — Telephone Encounter (Signed)
Patient will get prevnar Wed Nov 4 at 1115.

## 2013-10-29 DIAGNOSIS — H21562 Pupillary abnormality, left eye: Secondary | ICD-10-CM | POA: Diagnosis not present

## 2013-10-29 DIAGNOSIS — H25042 Posterior subcapsular polar age-related cataract, left eye: Secondary | ICD-10-CM | POA: Diagnosis not present

## 2013-10-29 DIAGNOSIS — H2512 Age-related nuclear cataract, left eye: Secondary | ICD-10-CM | POA: Diagnosis not present

## 2013-10-29 DIAGNOSIS — H25032 Anterior subcapsular polar age-related cataract, left eye: Secondary | ICD-10-CM | POA: Diagnosis not present

## 2013-10-29 DIAGNOSIS — H25012 Cortical age-related cataract, left eye: Secondary | ICD-10-CM | POA: Diagnosis not present

## 2013-11-06 ENCOUNTER — Ambulatory Visit (INDEPENDENT_AMBULATORY_CARE_PROVIDER_SITE_OTHER): Payer: Medicare Other | Admitting: Internal Medicine

## 2013-11-06 DIAGNOSIS — Z23 Encounter for immunization: Secondary | ICD-10-CM

## 2013-11-06 MED ORDER — PNEUMOCOCCAL 13-VAL CONJ VACC IM SUSP
0.5000 mL | Freq: Once | INTRAMUSCULAR | Status: AC
  Administered 2013-11-06: 0.5 mL via INTRAMUSCULAR

## 2013-11-06 MED ORDER — PNEUMOCOCCAL 13-VAL CONJ VACC IM SUSP: 0.5000 mL | INTRAMUSCULAR | Status: DC

## 2014-01-15 ENCOUNTER — Encounter: Payer: Self-pay | Admitting: Internal Medicine

## 2014-02-13 DIAGNOSIS — H43813 Vitreous degeneration, bilateral: Secondary | ICD-10-CM | POA: Diagnosis not present

## 2014-02-13 DIAGNOSIS — H3531 Nonexudative age-related macular degeneration: Secondary | ICD-10-CM | POA: Diagnosis not present

## 2014-03-08 ENCOUNTER — Other Ambulatory Visit: Payer: Self-pay | Admitting: Internal Medicine

## 2014-03-10 ENCOUNTER — Ambulatory Visit (INDEPENDENT_AMBULATORY_CARE_PROVIDER_SITE_OTHER): Payer: Medicare Other | Admitting: Internal Medicine

## 2014-03-10 ENCOUNTER — Telehealth: Payer: Self-pay | Admitting: *Deleted

## 2014-03-10 ENCOUNTER — Encounter: Payer: Self-pay | Admitting: Internal Medicine

## 2014-03-10 ENCOUNTER — Ambulatory Visit (HOSPITAL_COMMUNITY)
Admission: RE | Admit: 2014-03-10 | Discharge: 2014-03-10 | Disposition: A | Discharge status: Home / Self Care | Payer: Medicare Other | Source: Ambulatory Visit | Attending: Internal Medicine | Admitting: Internal Medicine

## 2014-03-10 VITALS — BP 128/74 | HR 84 | Temp 97.8°F | Wt 153.0 lb

## 2014-03-10 DIAGNOSIS — M79652 Pain in left thigh: Secondary | ICD-10-CM

## 2014-03-10 DIAGNOSIS — M79605 Pain in left leg: Secondary | ICD-10-CM | POA: Diagnosis not present

## 2014-03-10 DIAGNOSIS — M79602 Pain in left arm: Secondary | ICD-10-CM

## 2014-03-10 NOTE — Progress Notes (Signed)
   Subjective:    Patient ID: Sabrina English, female    DOB: April 10, 1933, 81 y.o.   MRN: [id number]  HPI  Patient has been going to the [facility] riding a stationary bicycle for an hour to 3 times a week. About 3 weeks ago she developed pain in her left inner thigh area. Says it hurts to raise her leg upwards. Denies any fall or injury to the left lower extremity. No weakness.    Review of Systems     Objective:   Physical Exam  She has palpable thickening in left medial thigh that is tender to touch. No erythema. Vascular ultrasound revealed no evidence of DVT      Assessment & Plan:  Musculoskeletal pain left medial 5  Plan: Patient may take Aleve, use warm hot compresses 20 minutes twice daily, do not exercise until symptoms improve, recommend physical therapy.

## 2014-03-10 NOTE — Telephone Encounter (Signed)
Patient will try aleve and warm compresses she will call us back in 1 week if no improvement she will agree to physical therapy

## 2014-03-10 NOTE — Progress Notes (Signed)
VASCULAR LAB PRELIMINARY  PRELIMINARY  PRELIMINARY  PRELIMINARY  Left lower extremity venous Doppler completed.    Preliminary report:  There is no DVT or SVT noted in the left lower extremity.   Sukhman Kocher, RVT 03/10/2014, 2:44 PM

## 2014-03-10 NOTE — Patient Instructions (Signed)
We recommend physical therapy. Take Aleve. Warm hot compresses 20 minutes twice daily. Do not go exercise until pain has resolved.

## 2014-04-03 ENCOUNTER — Other Ambulatory Visit: Payer: Self-pay | Admitting: Internal Medicine

## 2014-04-28 DIAGNOSIS — H918X3 Other specified hearing loss, bilateral: Secondary | ICD-10-CM | POA: Diagnosis not present

## 2014-04-28 DIAGNOSIS — J342 Deviated nasal septum: Secondary | ICD-10-CM | POA: Diagnosis not present

## 2014-04-28 DIAGNOSIS — H903 Sensorineural hearing loss, bilateral: Secondary | ICD-10-CM | POA: Diagnosis not present

## 2014-04-28 DIAGNOSIS — H6123 Impacted cerumen, bilateral: Secondary | ICD-10-CM | POA: Diagnosis not present

## 2014-04-28 DIAGNOSIS — M270 Developmental disorders of jaws: Secondary | ICD-10-CM | POA: Diagnosis not present

## 2014-04-28 DIAGNOSIS — H61301 Acquired stenosis of right external ear canal, unspecified: Secondary | ICD-10-CM | POA: Diagnosis not present

## 2014-05-07 ENCOUNTER — Ambulatory Visit (INDEPENDENT_AMBULATORY_CARE_PROVIDER_SITE_OTHER): Payer: Medicare Other | Admitting: Physician Assistant

## 2014-05-07 ENCOUNTER — Ambulatory Visit (INDEPENDENT_AMBULATORY_CARE_PROVIDER_SITE_OTHER): Payer: Medicare Other

## 2014-05-07 VITALS — BP 110/60 | HR 94 | Temp 98.5°F | Resp 16 | Ht 63.0 in | Wt 152.0 lb

## 2014-05-07 DIAGNOSIS — R059 Cough, unspecified: Secondary | ICD-10-CM

## 2014-05-07 DIAGNOSIS — R05 Cough: Secondary | ICD-10-CM | POA: Diagnosis not present

## 2014-05-07 DIAGNOSIS — R509 Fever, unspecified: Secondary | ICD-10-CM

## 2014-05-07 DIAGNOSIS — J189 Pneumonia, unspecified organism: Secondary | ICD-10-CM | POA: Diagnosis not present

## 2014-05-07 LAB — POCT CBC
Granulocyte percent: 80.6 %G — AB (ref 37–80)
HCT, POC: 37.4 % — AB (ref 37.7–47.9)
Hemoglobin: 12.6 g/dL (ref 12.2–16.2)
LYMPH, POC: 1.3 (ref 0.6–3.4)
MCH, POC: 28.5 pg (ref 27–31.2)
MCHC: 33.6 g/dL (ref 31.8–35.4)
MCV: 84.8 fL (ref 80–97)
MID (cbc): 0.9 (ref 0–0.9)
MPV: 6.3 fL (ref 0–99.8)
POC GRANULOCYTE: 8.9 — AB (ref 2–6.9)
POC LYMPH PERCENT: 11.4 %L (ref 10–50)
POC MID %: 8 % (ref 0–12)
Platelet Count, POC: 294 10*3/uL (ref 142–424)
RBC: 4.41 M/uL (ref 4.04–5.48)
RDW, POC: 13.6 %
WBC: 11 10*3/uL — AB (ref 4.6–10.2)

## 2014-05-07 LAB — COMPREHENSIVE METABOLIC PANEL
ALK PHOS: 77 U/L (ref 39–117)
ALT: 13 U/L (ref 0–35)
AST: 17 U/L (ref 0–37)
Albumin: 3.7 g/dL (ref 3.5–5.2)
BUN: 19 mg/dL (ref 6–23)
CO2: 27 mEq/L (ref 19–32)
Calcium: 9.4 mg/dL (ref 8.4–10.5)
Chloride: 101 mEq/L (ref 96–112)
Creat: 1.14 mg/dL — ABNORMAL HIGH (ref 0.50–1.10)
Glucose, Bld: 100 mg/dL — ABNORMAL HIGH (ref 70–99)
POTASSIUM: 4 meq/L (ref 3.5–5.3)
SODIUM: 140 meq/L (ref 135–145)
TOTAL PROTEIN: 6.5 g/dL (ref 6.0–8.3)
Total Bilirubin: 0.8 mg/dL (ref 0.2–1.2)

## 2014-05-07 MED ORDER — CEFTRIAXONE SODIUM 1 G IJ SOLR
1.0000 g | Freq: Once | INTRAMUSCULAR | Status: AC
  Administered 2014-05-07: 1 g via INTRAMUSCULAR

## 2014-05-07 MED ORDER — HYDROCODONE-HOMATROPINE 5-1.5 MG/5ML PO SYRP: 5.0000 mL | ORAL_SOLUTION | Freq: Three times a day (TID) | ORAL | Status: DC | PRN

## 2014-05-07 MED ORDER — LEVOFLOXACIN 500 MG PO TABS: 500.0000 mg | ORAL_TABLET | Freq: Every day | ORAL | Status: AC

## 2014-05-07 NOTE — Patient Instructions (Addendum)
Continue to drink copious fluids.    Pneumonia Pneumonia is an infection of the lungs.  CAUSES Pneumonia may be caused by bacteria or a virus. Usually, these infections are caused by breathing infectious particles into the lungs (respiratory tract). SIGNS AND SYMPTOMS   Cough.  Fever.  Chest pain.  Increased rate of breathing.  Wheezing.  Mucus production. DIAGNOSIS  If you have the common symptoms of pneumonia, your health care provider will typically confirm the diagnosis with a chest X-ray. The X-ray will show an abnormality in the lung (pulmonary infiltrate) if you have pneumonia. Other tests of your blood, urine, or sputum may be done to find the specific cause of your pneumonia. Your health care provider may also do tests (blood gases or pulse oximetry) to see how well your lungs are working. TREATMENT  Some forms of pneumonia may be spread to other people when you cough or sneeze. You may be asked to wear a mask before and during your exam. Pneumonia that is caused by bacteria is treated with antibiotic medicine. Pneumonia that is caused by the influenza virus may be treated with an antiviral medicine. Most other viral infections must run their course. These infections will not respond to antibiotics.  HOME CARE INSTRUCTIONS   Cough suppressants may be used if you are losing too much rest. However, coughing protects you by clearing your lungs. You should avoid using cough suppressants if you can.  Your health care provider may have prescribed medicine if he or she thinks your pneumonia is caused by bacteria or influenza. Finish your medicine even if you start to feel better.  Your health care provider may also prescribe an expectorant. This loosens the mucus to be coughed up.  Take medicines only as directed by your health care provider.  Do not smoke. Smoking is a common cause of bronchitis and can contribute to pneumonia. If you are a smoker and continue to smoke, your cough  may last several weeks after your pneumonia has cleared.  A cold steam vaporizer or humidifier in your room or home may help loosen mucus.  Coughing is often worse at night. Sleeping in a semi-upright position in a recliner or using a couple pillows under your head will help with this.  Get rest as you feel it is needed. Your body will usually let you know when you need to rest. PREVENTION A pneumococcal shot (vaccine) is available to prevent a common bacterial cause of pneumonia. This is usually suggested for:  People over 73 years old.  Patients on chemotherapy.  People with chronic lung problems, such as bronchitis or emphysema.  People with immune system problems. If you are over 65 or have a high risk condition, you may receive the pneumococcal vaccine if you have not received it before. In some countries, a routine influenza vaccine is also recommended. This vaccine can help prevent some cases of pneumonia.You may be offered the influenza vaccine as part of your care. If you smoke, it is time to quit. You may receive instructions on how to stop smoking. Your health care provider can provide medicines and counseling to help you quit. SEEK MEDICAL CARE IF: You have a fever. SEEK IMMEDIATE MEDICAL CARE IF:   Your illness becomes worse. This is especially true if you are elderly or weakened from any other disease.  You cannot control your cough with suppressants and are losing sleep.  You begin coughing up blood.  You develop pain which is getting worse or is  uncontrolled with medicines.  Any of the symptoms which initially brought you in for treatment are getting worse rather than better.  You develop shortness of breath or chest pain. MAKE SURE YOU:   Understand these instructions.  Will watch your condition.  Will get help right away if you are not doing well or get worse. Document Released: 12/20/2004 Document Revised: 05/06/2013 Document Reviewed:  03/11/2010 [facility] Patient Information 2015 [place], [place]. This information is not intended to replace advice given to you by your health care provider. Make sure you discuss any questions you have with your health care provider.

## 2014-05-07 NOTE — Progress Notes (Signed)
05/07/2014 at 12:34 PM  Sabrina English / DOB: Jun 17, 1933 / MRN: [id number]  The patient has Meniere disease; Hyperlipidemia; GE reflux; Osteopenia; and Macular degeneration of both eyes on her problem list.  SUBJECTIVE  Chief complaint: Cough and Fever   Sabrina English is a 81 y.o. female complaining of fevers up to 101 degrees, chest congestion, chills, and productive cough that started 3 days ago.  Associated symptoms include difficulty breathing today, and she denies runny nose, sore throat and headache.The patient feels her symptoms are improving. Treatments tried thus far include ibuprofen with fair  relief. She denies sick contacts. She had the prevnar 13 six  months previous. She has had the seasonal flu shot.    She  has a past medical history of Hyperlipidemia; GERD (gastroesophageal reflux disease); Osteoporosis; Meniere's disease; and Cancer.    Medications reviewed and updated by myself where necessary, and exist elsewhere in the encounter.   Sabrina English has No Known Allergies. She  reports that she has never smoked. She has never used smokeless tobacco. She reports that she does not use illicit drugs. She  has no sexual activity history on file. The patient  has past surgical history that includes Melanoma excision; Axillary node dissection (1983); and Bunionectomy.  Her family history includes Cancer in her father and sister; Hyperlipidemia in her mother; Mental illness in her mother.  Review of Systems  Constitutional: Negative for weight loss.  Respiratory: Negative for hemoptysis and shortness of breath.   Cardiovascular: Negative for chest pain.  Gastrointestinal: Negative for nausea, abdominal pain and diarrhea.  Genitourinary: Negative for dysuria, urgency, frequency and hematuria.  Musculoskeletal: Negative for myalgias.  Neurological: Negative for dizziness and headaches.    OBJECTIVE  Her  height is 5\' 3"  (1.6 m) and weight is 152 lb (68.947 kg). Her  oral temperature is 98.5 F (36.9 C). Her blood pressure is 110/60 and her pulse is 94. Her respiration is 16 and oxygen saturation is 95%.  The patient's body mass index is 26.93 kg/(m^2).  Physical Exam  Constitutional: She is oriented to person, place, and time. She appears well-developed and well-nourished. No distress.  HENT:  Right Ear: Hearing, tympanic membrane, external ear and ear canal normal.  Left Ear: Hearing, tympanic membrane, external ear and ear canal normal.  Nose: No mucosal edema. Right sinus exhibits no maxillary sinus tenderness and no frontal sinus tenderness. Left sinus exhibits no maxillary sinus tenderness and no frontal sinus tenderness.  Mouth/Throat: Uvula is midline, oropharynx is clear and moist and mucous membranes are normal.  Cardiovascular: Normal rate, regular rhythm and normal heart sounds.   Respiratory: Effort normal and breath sounds normal. She has no wheezes. She has no rales.  Neurological: She is alert and oriented to person, place, and time.  Skin: Skin is warm and dry. She is not diaphoretic.  Psychiatric: She has a normal mood and affect.    Results for orders placed or performed in visit on 05/07/14 (from the past 24 hour(s))  POCT CBC     Status: Abnormal   Collection Time: 05/07/14 11:15 AM  Result Value Ref Range   WBC 11.0 (A) 4.6 - 10.2 K/uL   Lymph, poc 1.3 0.6 - 3.4   POC LYMPH PERCENT 11.4 10 - 50 %L   MID (cbc) 0.9 0 - 0.9   POC MID % 8.0 0 - 12 %M   POC Granulocyte 8.9 (A) 2 - 6.9   Granulocyte percent 80.6 (A) 37 -  80 %G   RBC 4.41 4.04 - 5.48 M/uL   Hemoglobin 12.6 12.2 - 16.2 g/dL   HCT, POC 37.4 (A) 37.7 - 47.9 %   MCV 84.8 80 - 97 fL   MCH, POC 28.5 27 - 31.2 pg   MCHC 33.6 31.8 - 35.4 g/dL   RDW, POC 13.6 %   Platelet Count, POC 294 142 - 424 K/uL   MPV 6.3 0 - 99.8 fL   [facility] reading (PRIMARY) by  Dr. Danielle Rankin: Infiltrate noted on right lower lung field and along the left lingula.    ASSESSMENT & PLAN  Sabrina English was  seen today for cough and fever.  Diagnoses and all orders for this visit:  Cough Orders: -     POCT CBC -     DG Chest 2 View; Future -     HYDROcodone-homatropine (HYCODAN) 5-1.5 MG/5ML syrup; Take 5 mLs by mouth every 8 (eight) hours as needed for cough.  Fever, unspecified fever cause Orders: -     POCT CBC -     DG Chest 2 View; Future  Pneumonia, organism unspecified: Patient to follow up with Dr. Everlene Farrier on this coming Saturday.  She is to return to the clinic sooner, or go to the ED, if she becomes worse.  Advised that she continue vigorous po hydration.  Levo down titrated due to estimated CrCL of 48.   Orders: -     cefTRIAXone (ROCEPHIN) injection 1 g; Inject 1 g into the muscle once. -     levofloxacin (LEVAQUIN) 500 MG tablet; Take 1 tablet (500 mg total) by mouth daily.    The patient was advised to call or come back to clinic if she does not see an improvement in symptoms, or worsens with the above plan.   Philis Fendt, MHS, PA-C [facility] 05/07/2014 12:34 PM

## 2014-05-10 ENCOUNTER — Ambulatory Visit (INDEPENDENT_AMBULATORY_CARE_PROVIDER_SITE_OTHER): Payer: Medicare Other | Admitting: Emergency Medicine

## 2014-05-10 ENCOUNTER — Ambulatory Visit (INDEPENDENT_AMBULATORY_CARE_PROVIDER_SITE_OTHER): Payer: Medicare Other

## 2014-05-10 VITALS — BP 110/64 | HR 81 | Temp 98.0°F | Resp 16 | Ht 64.0 in | Wt 151.8 lb

## 2014-05-10 DIAGNOSIS — J189 Pneumonia, unspecified organism: Secondary | ICD-10-CM

## 2014-05-10 DIAGNOSIS — R05 Cough: Secondary | ICD-10-CM

## 2014-05-10 DIAGNOSIS — R059 Cough, unspecified: Secondary | ICD-10-CM

## 2014-05-10 NOTE — Progress Notes (Addendum)
   Subjective:  This chart was scribed for Nena Jordan, MD by [facility], medical scribe at [facility].The patient was seen in exam room 10 and the patient's care was started at 10:31 AM.   Patient ID: Sabrina English, female    DOB: 06-Jan-1933, 81 y.o.   MRN: [id number] Chief Complaint  Patient presents with  . Follow-up   HPI  HPI Comments: Sabrina English is a 81 y.o. female who presents to [facility] for a follow up. Pt was diagnosed with pneumonia on 05/07/2014. She was given a 1 gram Rocephin injection IM and Levaquin 500 mg once daily. She still has a cough that is producing green phlegm. Pt states she has an intermittent fever. She denies shortness of breath.   Review of Systems  Constitutional: Positive for fever.  Respiratory: Positive for cough. Negative for shortness of breath.       Objective:  BP 110/64 mmHg  Pulse 81  Temp(Src) 98 F (36.7 C) (Oral)  Resp 16  Ht 5\' 4"  (1.626 m)  Wt 151 lb 12.8 oz (68.856 kg)  BMI 26.04 kg/m2  SpO2 95% Physical Exam  Nursing note and vitals reviewed. CONSTITUTIONAL: Well developed/well nourished HEAD: Normocephalic/atraumatic EYES: EOMI/PERRL ENMT: Mucous membranes moist NECK: supple no meningeal signs SPINE/BACK:entire spine nontender CV: S1/S2 noted, no murmurs/rubs/gallops noted LUNGS: Lungs are clear to auscultation bilaterally, no apparent distress, few crackles both bases but good air exchange.  ABDOMEN: soft, nontender, no rebound or guarding, bowel sounds noted throughout abdomen GU:no cva tenderness NEURO: Pt is awake/alert/appropriate, moves all extremitiesx4.  No facial droop.   EXTREMITIES: pulses normal/equal, full ROM SKIN: warm, color normal PSYCH: no abnormalities of mood noted, alert and oriented to situation.   [facility] reading (PRIMARY) by  Dr. Everlene Farrier the infiltrate on the left is better the infiltrate on the right is about the same.  Assessment & Plan:    1.Pneumonia  organism unspecified - DG Chest 2 View; Future chest x-ray looks improved to me she will finish out her antibiotics and repeat film in 7-10 days the radiologist felt like the x-ray looked about the same. 2. Cough No  Change in meds   I personally performed the services described in this documentation, which was scribed in my presence. The recorded information has been reviewed and is accurate.  Arlyss Queen, MD  [facility]  05/10/2014 11:36 AM

## 2014-05-18 ENCOUNTER — Ambulatory Visit (INDEPENDENT_AMBULATORY_CARE_PROVIDER_SITE_OTHER): Payer: Medicare Other | Admitting: Emergency Medicine

## 2014-05-18 ENCOUNTER — Ambulatory Visit (INDEPENDENT_AMBULATORY_CARE_PROVIDER_SITE_OTHER): Payer: Medicare Other

## 2014-05-18 VITALS — BP 128/66 | HR 66 | Temp 97.3°F | Resp 14 | Ht 63.25 in | Wt 150.4 lb

## 2014-05-18 DIAGNOSIS — R059 Cough, unspecified: Secondary | ICD-10-CM

## 2014-05-18 DIAGNOSIS — R9389 Abnormal findings on diagnostic imaging of other specified body structures: Secondary | ICD-10-CM

## 2014-05-18 DIAGNOSIS — C439 Malignant melanoma of skin, unspecified: Secondary | ICD-10-CM | POA: Diagnosis not present

## 2014-05-18 DIAGNOSIS — J189 Pneumonia, unspecified organism: Secondary | ICD-10-CM

## 2014-05-18 DIAGNOSIS — R938 Abnormal findings on diagnostic imaging of other specified body structures: Secondary | ICD-10-CM

## 2014-05-18 DIAGNOSIS — R05 Cough: Secondary | ICD-10-CM | POA: Diagnosis not present

## 2014-05-18 MED ORDER — CLOTRIMAZOLE 10 MG MT TROC: 10.0000 mg | Freq: Every day | OROMUCOSAL | Status: DC

## 2014-05-18 NOTE — Addendum Note (Signed)
Addended by: Arlyss Queen A on: 05/18/2014 11:58 AM   Modules accepted: Orders

## 2014-05-18 NOTE — Addendum Note (Signed)
Addended by: Arlyss Queen A on: 05/18/2014 04:55 PM   Modules accepted: Orders

## 2014-05-18 NOTE — Progress Notes (Addendum)
   Subjective:  This chart was scribed for Arlyss Queen, MD by Moises Blood, Medical Scribe. This patient was seen in Room 8 and the patient's care was started 10:54 AM.    Patient ID: Sabrina English, female    DOB: [birth year], 80 y.o.   MRN: [id number]  HPI Sabrina English is a 81 y.o. female who presents to [facility] follow up for pneumonia that started 11 days ago. Pt is still feels weak and still coughing, but without green sputum. She denies fever.   Last chest xray showed patchy right middle low infiltrate; improvement in left basilar infiltrate. Of note the patient had a melanoma removed from her back in 1982. She subsequent [facility] Hayward was seen with metastatic disease to the left axilla. She subsequently underwent immunotherapy and has not had recurrence since that time.   Review of Systems  Constitutional: Negative for fever.  Respiratory: Positive for cough.   Neurological: Positive for weakness.       Objective:   Physical Exam CONSTITUTIONAL: Well developed/Wel nourished HEAD: Normocephalic/atraumatic EYES: EOMI/PERRL ENMT: Mucous membranes moist NECK: supple no meningeal signs SPINE/BACK: entire spine nontender CV: S1/S2 noted, no murmurs/rubs/gallops noted LUNGS: Lungs are clear to auscultation bilaterally, no apparent distress ABDOMEN: soft, non tender, no rebound or guarding, bowel sounds noted throughout abdomen GU: no cva tenderness NEURO: Pt is awake/alert/appropriate, moves all extremities x4. No facial droop. EXTREMITIES: pulses normal/equal, full ROM SKIN: warm, color normal PSYCH: no abnormalities of mood noted, alert, and oriented to situation [facility] reading (PRIMARY) by  Dr. Everlene Farrier infiltrates persist in both bases. She does have surgical clips present.        Assessment & Plan:  Patient has persistent bilateral lower lobe infiltrates. We'll proceed with CT chest. Patient may need pulmonary referral depending on what we see on CT. I don't see much change on  her chest x-ray.I personally performed the services described in this documentation, which was scribed in my presence. The recorded information has been reviewed and is accurate.  Nena Jordan, MD

## 2014-05-23 ENCOUNTER — Ambulatory Visit
Admission: RE | Admit: 2014-05-23 | Discharge: 2014-05-23 | Disposition: A | Discharge status: Home / Self Care | Payer: Medicare Other | Source: Ambulatory Visit | Attending: Emergency Medicine | Admitting: Emergency Medicine

## 2014-05-23 ENCOUNTER — Other Ambulatory Visit: Payer: PRIVATE HEALTH INSURANCE

## 2014-05-23 DIAGNOSIS — I2510 Atherosclerotic heart disease of native coronary artery without angina pectoris: Secondary | ICD-10-CM | POA: Diagnosis not present

## 2014-05-23 DIAGNOSIS — J189 Pneumonia, unspecified organism: Secondary | ICD-10-CM | POA: Diagnosis not present

## 2014-05-23 DIAGNOSIS — R9389 Abnormal findings on diagnostic imaging of other specified body structures: Secondary | ICD-10-CM

## 2014-05-23 DIAGNOSIS — Z85820 Personal history of malignant melanoma of skin: Secondary | ICD-10-CM | POA: Diagnosis not present

## 2014-05-23 DIAGNOSIS — C439 Malignant melanoma of skin, unspecified: Secondary | ICD-10-CM

## 2014-05-26 DIAGNOSIS — Z1231 Encounter for screening mammogram for malignant neoplasm of breast: Secondary | ICD-10-CM | POA: Diagnosis not present

## 2014-05-28 ENCOUNTER — Telehealth: Payer: Self-pay | Admitting: Urgent Care

## 2014-05-28 NOTE — Telephone Encounter (Signed)
Spoke with pt about this. She has appt with Dr Lamonte Sakai, pulmonologist on 06/05/14.

## 2014-05-28 NOTE — Telephone Encounter (Signed)
Please try patient again re: notes from Dr. Everlene Farrier below. Thank you!   Notes Recorded by Darlyne Russian, MD on 05/24/2014 at 10:17 AM There are patchy nodular areas present in the right middle lobe and right lower lobe. This could be residual from old infection. My suggestion would be to make an appointment with one of the pulmonary doctors and get their opinion. If she is agreeable make the appointment with Dr. Gwenette Greet or Dr. Chase Caller for their opinion. If she does not want to see the pulmonary doctor she needs to have another repeat scan in 3 months. She does have some calcification in the coronary arteries which is not unusual for a patient over 65.

## 2014-05-28 NOTE — Telephone Encounter (Signed)
-----   Message from Anthoney Harada sent at 05/28/2014  3:15 PM EDT ----- Did not reach patient left message to call back asap

## 2014-06-04 ENCOUNTER — Encounter: Payer: Self-pay | Admitting: Internal Medicine

## 2014-06-05 ENCOUNTER — Ambulatory Visit (INDEPENDENT_AMBULATORY_CARE_PROVIDER_SITE_OTHER): Payer: Medicare Other | Admitting: Emergency Medicine

## 2014-06-05 ENCOUNTER — Encounter: Payer: Self-pay | Admitting: Emergency Medicine

## 2014-06-05 VITALS — BP 114/72 | HR 73 | Ht 63.0 in | Wt 152.0 lb

## 2014-06-05 DIAGNOSIS — R911 Solitary pulmonary nodule: Secondary | ICD-10-CM

## 2014-06-05 DIAGNOSIS — R918 Other nonspecific abnormal finding of lung field: Secondary | ICD-10-CM

## 2014-06-05 NOTE — Patient Instructions (Signed)
We will repeat your CT scan of the chest in August 2016.  Call our office if you develop any new breathing symptoms. If so we may decide to perform a CT scan sooner Follow with Dr Lamonte Sakai in August after the CT scan to review.

## 2014-06-05 NOTE — Progress Notes (Signed)
Subjective:    Patient ID: Sabrina English, female    DOB: 1933-02-25, 81 y.o.   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  She was seen by Dr Everlene Farrier about a month ago with cough, fever, sputum. CXR showed an evolving RLL infiltrate. She was treated with abx with clinical improvement. Followup imaging with CT chest 05/23/14 reviewed by me, shows a 93mm RML nodule and an 70mm RLL posterior irregularly shaped nodule.   Sabrina English tells me that she feels better although she is still somewhat weak. Her cough is improved but persists. She is no longer making sputum. She has never seen hemoptysis.    Review of Systems  Constitutional: Negative for fever and unexpected weight change.  HENT: Negative for congestion, dental problem, ear pain, nosebleeds, postnasal drip, rhinorrhea, sinus pressure and sneezing.   Eyes: Negative for redness and itching.  Respiratory: Positive for cough. Negative for choking, chest tightness, shortness of breath and wheezing.   Cardiovascular: Negative for palpitations and leg swelling.  Gastrointestinal: Positive for nausea. Negative for vomiting.  Genitourinary: Negative for dysuria.  Musculoskeletal: Negative for joint swelling.  Skin: Negative for rash.  Neurological: Negative for headaches.  Hematological: Does not bruise/bleed easily.  Psychiatric/Behavioral: Negative for dysphoric mood. The patient is not nervous/anxious.     Past Medical History  Diagnosis Date  . Hyperlipidemia   . GERD (gastroesophageal reflux disease)   . Osteoporosis     osteopenia  . Meniere's disease   . Cancer     melanoma l shoulder     Family History  Problem Relation Age of Onset  . Mental illness Mother   . Hyperlipidemia Mother   . Cancer Father   . Cancer Sister      History    Social History  . Marital Status: Married    Spouse Name: N/A  . Number of Children: N/A  . Years of Education: N/A   Occupational History  . retired    Social History Main Topics  . Smoking status: Never Smoker   . Smokeless tobacco: Never Used  . Alcohol Use: No  . Drug Use: No  . Sexual Activity: Not on file   Other Topics Concern  . Not on file   Social History Narrative     No Known Allergies   Outpatient Prescriptions Prior to Visit  Medication Sig Dispense Refill  . aspirin 81 MG EC tablet Take 81 mg by mouth daily.      . calcium-vitamin D (OSCAL WITH D) 500-200 MG-UNIT per tablet Take 1 tablet by mouth daily.      . cholecalciferol (VITAMIN D) 1000 UNITS tablet Take 1,000 Units by mouth daily.    . Cyclandelate POWD by Does not apply route.    . dicyclomine (BENTYL) 20 MG tablet Take 1 tablet (20 mg total) by mouth every 6 (six) hours. 120 tablet 1  . HYDROcodone-homatropine (HYCODAN) 5-1.5 MG/5ML syrup Take 5 mLs by mouth every 8 (eight) hours as needed for cough. 120 mL 0  . meclizine (ANTIVERT) 25 MG tablet Take 1 tablet (25 mg total) by mouth 3 (three) times daily as needed. 30 tablet 11  . Multiple Vitamins-Minerals (PRESERVISION AREDS PO) Take by mouth.    . simvastatin (ZOCOR) 20 MG tablet TAKE 1 TABLET (20 MG TOTAL) BY MOUTH DAILY AT 6  PM. 90 tablet 3  . triamterene-hydrochlorothiazide (DYAZIDE) 37.5-25 MG per capsule TAKE ONE CAPSULE EVERY DAY 90 capsule 3  . Vitamins-Lipotropics (LIPOFLAVONOID PO) Take by mouth.    . clotrimazole (MYCELEX) 10 MG troche Take 1 tablet (10 mg total) by mouth 5 (five) times daily. (Patient not taking: Reported on 06/05/2014) 25 tablet 1   No facility-administered medications prior to visit.       Objective:   Physical Exam Filed Vitals:   06/05/14 1446  BP: 114/72  Pulse: 73  Height: 5\' 3"  (1.6 m)  Weight: 152 lb (68.947 kg)  SpO2: 96%   Gen: Pleasant, well-nourished, in no distress,  normal affect  ENT: No  lesions,  mouth clear,  oropharynx clear, no postnasal drip  Neck: No JVD, no TMG, no carotid bruits  Lungs: No use of accessory muscles, clear without rales or rhonchi  Cardiovascular: RRR, heart sounds normal, no murmur or gallops, no peripheral edema  Musculoskeletal: No deformities, no cyanosis or clubbing  Neuro: alert, non focal, she has a subtle facial tic  Skin: Warm, no lesions or rashes     Assessment & Plan:  Pulmonary nodules The patient presents with  right middle lobe and right lower lobe irregularly-shaped pulmonary nodules. Etiology is unclear but this is in the aftermath of a pneumonia syndrome. Certainly these areas could reflect resolving pneumonia and/or evolving scar. She is a never smoker which decreases her risk for lung cancer but she does have a history of a remote metastatic melanoma. Taken together I still believe her risk for malignancy is low to moderate. I discussed the option of pursuing biopsy now versus waiting and repeating her CT scan of the chest in 3 months to look for interval change. We have decided to repeat the scan in August and then plan our next steps depending on whether the nodules have resolved, remained stable, or grown. We will ensure that the follow-up scan is a super-dimension scan to allow navigational bronchoscopy if necessary.

## 2014-06-05 NOTE — Assessment & Plan Note (Signed)
The patient presents with  right middle lobe and right lower lobe irregularly-shaped pulmonary nodules. Etiology is unclear but this is in the aftermath of a pneumonia syndrome. Certainly these areas could reflect resolving pneumonia and/or evolving scar. She is a never smoker which decreases her risk for lung cancer but she does have a history of a remote metastatic melanoma. Taken together I still believe her risk for malignancy is low to moderate. I discussed the option of pursuing biopsy now versus waiting and repeating her CT scan of the chest in 3 months to look for interval change. We have decided to repeat the scan in August and then plan our next steps depending on whether the nodules have resolved, remained stable, or grown. We will ensure that the follow-up scan is a super-dimension scan to allow navigational bronchoscopy if necessary.

## 2014-06-24 ENCOUNTER — Encounter: Payer: Self-pay | Admitting: Internal Medicine

## 2014-08-08 ENCOUNTER — Ambulatory Visit (INDEPENDENT_AMBULATORY_CARE_PROVIDER_SITE_OTHER)
Admission: RE | Admit: 2014-08-08 | Discharge: 2014-08-08 | Disposition: A | Discharge status: Home / Self Care | Payer: Medicare Other | Source: Ambulatory Visit | Attending: Emergency Medicine | Admitting: Emergency Medicine

## 2014-08-08 DIAGNOSIS — R911 Solitary pulmonary nodule: Secondary | ICD-10-CM

## 2014-08-08 DIAGNOSIS — J984 Other disorders of lung: Secondary | ICD-10-CM | POA: Diagnosis not present

## 2014-08-11 ENCOUNTER — Telehealth: Payer: Self-pay | Admitting: Emergency Medicine

## 2014-08-11 NOTE — Telephone Encounter (Signed)
Pt is requesting results of CT results from 08/08/14.  RB - please advise. Thanks.

## 2014-08-11 NOTE — Telephone Encounter (Signed)
lmtcb x1 for pt. 

## 2014-08-11 NOTE — Telephone Encounter (Signed)
Please let her know that the nodules we were following have resolved. This is good news. They probably reflected some residual inflammation from her pneumonia, now gone.

## 2014-08-12 NOTE — Telephone Encounter (Signed)
Pt returned call [phone]

## 2014-08-12 NOTE — Telephone Encounter (Signed)
Pt aware of results.  Cancelled pt's upcoming ov at pt's request.  Nothing further needed at this time.

## 2014-08-14 DIAGNOSIS — H43813 Vitreous degeneration, bilateral: Secondary | ICD-10-CM | POA: Diagnosis not present

## 2014-08-14 DIAGNOSIS — H3531 Nonexudative age-related macular degeneration: Secondary | ICD-10-CM | POA: Diagnosis not present

## 2014-08-18 DIAGNOSIS — H16103 Unspecified superficial keratitis, bilateral: Secondary | ICD-10-CM | POA: Diagnosis not present

## 2014-08-18 DIAGNOSIS — Z961 Presence of intraocular lens: Secondary | ICD-10-CM | POA: Diagnosis not present

## 2014-08-18 DIAGNOSIS — H04123 Dry eye syndrome of bilateral lacrimal glands: Secondary | ICD-10-CM | POA: Diagnosis not present

## 2014-08-18 DIAGNOSIS — H3531 Nonexudative age-related macular degeneration: Secondary | ICD-10-CM | POA: Diagnosis not present

## 2014-09-03 ENCOUNTER — Ambulatory Visit: Payer: Medicare Other | Admitting: Emergency Medicine

## 2014-09-09 ENCOUNTER — Other Ambulatory Visit: Payer: Self-pay | Admitting: Internal Medicine

## 2014-09-09 ENCOUNTER — Telehealth: Payer: Self-pay | Admitting: *Deleted

## 2014-09-09 ENCOUNTER — Ambulatory Visit (INDEPENDENT_AMBULATORY_CARE_PROVIDER_SITE_OTHER): Payer: Medicare Other | Admitting: Internal Medicine

## 2014-09-09 ENCOUNTER — Encounter: Payer: Self-pay | Admitting: Internal Medicine

## 2014-09-09 VITALS — BP 124/78 | HR 70 | Temp 98.2°F | Ht 63.0 in | Wt 153.0 lb

## 2014-09-09 DIAGNOSIS — R55 Syncope and collapse: Secondary | ICD-10-CM

## 2014-09-09 DIAGNOSIS — R5383 Other fatigue: Secondary | ICD-10-CM

## 2014-09-09 DIAGNOSIS — R946 Abnormal results of thyroid function studies: Secondary | ICD-10-CM | POA: Diagnosis not present

## 2014-09-09 LAB — CBC WITH DIFFERENTIAL/PLATELET
BASOS PCT: 0 % (ref 0–1)
Basophils Absolute: 0 10*3/uL (ref 0.0–0.1)
Eosinophils Absolute: 0.1 10*3/uL (ref 0.0–0.7)
Eosinophils Relative: 1 % (ref 0–5)
HCT: 36.3 % (ref 36.0–46.0)
HEMOGLOBIN: 12.3 g/dL (ref 12.0–15.0)
Lymphocytes Relative: 34 % (ref 12–46)
Lymphs Abs: 1.7 10*3/uL (ref 0.7–4.0)
MCH: 28.5 pg (ref 26.0–34.0)
MCHC: 33.9 g/dL (ref 30.0–36.0)
MCV: 84.2 fL (ref 78.0–100.0)
MONO ABS: 0.5 10*3/uL (ref 0.1–1.0)
MPV: 8.9 fL (ref 8.6–12.4)
Monocytes Relative: 9 % (ref 3–12)
NEUTROS ABS: 2.9 10*3/uL (ref 1.7–7.7)
NEUTROS PCT: 56 % (ref 43–77)
Platelets: 261 10*3/uL (ref 150–400)
RBC: 4.31 MIL/uL (ref 3.87–5.11)
RDW: 13.6 % (ref 11.5–15.5)
WBC: 5.1 10*3/uL (ref 4.0–10.5)

## 2014-09-09 LAB — COMPLETE METABOLIC PANEL WITH GFR
ALT: 16 U/L (ref 6–29)
AST: 25 U/L (ref 10–35)
Albumin: 3.8 g/dL (ref 3.6–5.1)
Alkaline Phosphatase: 84 U/L (ref 33–130)
BILIRUBIN TOTAL: 0.6 mg/dL (ref 0.2–1.2)
BUN: 32 mg/dL — ABNORMAL HIGH (ref 7–25)
CHLORIDE: 104 mmol/L (ref 98–110)
CO2: 29 mmol/L (ref 20–31)
Calcium: 10.4 mg/dL (ref 8.6–10.4)
Creat: 1.14 mg/dL — ABNORMAL HIGH (ref 0.60–0.93)
GFR, EST NON AFRICAN AMERICAN: 46 mL/min — AB (ref >=60)
GFR, Est African American: 53 mL/min — ABNORMAL LOW (ref >=60)
GLUCOSE: 85 mg/dL (ref 65–99)
POTASSIUM: 3.9 mmol/L (ref 3.5–5.3)
SODIUM: 138 mmol/L (ref 135–146)
TOTAL PROTEIN: 6.5 g/dL (ref 6.1–8.1)

## 2014-09-09 LAB — TSH: TSH: 0.282 u[IU]/mL — AB (ref 0.350–4.500)

## 2014-09-09 NOTE — Telephone Encounter (Signed)
Spoke with patient appt information for CT and Holter monitor given to patient.

## 2014-09-09 NOTE — Telephone Encounter (Signed)
Scheduled patient for Holter monitor (24 hr) September 12th at 8:30 am at [facility] [address] 3rd floor.

## 2014-09-09 NOTE — Telephone Encounter (Signed)
Scheduled patient for CT head no contrast at [address] location appt is Thursday Sept. 8,2016 at 2:50pm patient is to arrive at 2:30pm

## 2014-09-10 LAB — T4, FREE: Free T4: 1.01 ng/dL (ref 0.80–1.80)

## 2014-09-10 NOTE — Progress Notes (Signed)
   Subjective:    Patient ID: Sabrina English, female    DOB: 10-29-1933, 81 y.o.   MRN: [id number]  HPI  81 year old White Female was standing in her kitchen near the counter recently and had a near syncopal episode. Grandchildren were present. Sudden onset with no nausea or sweating. No chest pain. Just felt herself start to head downward toward the floor. Did not strike her head. Might have blacked out briefly if at all. Came to quickly and remembered the event. No seizure activity noted. Denies being under stress or have had recent illness to cause near-syncope. She has not had this before so this was disconcerting to her.  She has a history of Mnire's disease, GE reflux, hyperlipidemia, remote history of melanoma, macular degeneration and osteopenia.  She did not have frank vertigo. She takes a baby aspirin daily. She has hearing loss but does not use hearing aids.  Social history: She is a widow. Retired from an Human resources officer. Does not smoke or consume alcohol. 3 adult children. Resides alone. Drives.  Family history: Father died at 36 of prostate cancer. Mother died at age 9 of all summer's disease complications. Half sister died at age 67 with chronic lung disease. One sister died of colon cancer days 23. One half brother in good health. Younger brother in fairly good health.    Review of Systems  noncontributory. No headache or visual disturbance prior to episode. Had not felt ill prior to episode.     Objective:   Physical Exam  Constitutional: She is oriented to person, place, and time. She appears well-developed and well-nourished. No distress.  HENT:  Head: Normocephalic and atraumatic.  Right Ear: External ear normal.  Left Ear: External ear normal.  Mouth/Throat: Oropharynx is clear and moist. No oropharyngeal exudate.  Eyes: Conjunctivae and EOM are normal. Pupils are equal, round, and reactive to light. Right eye exhibits no discharge. Left eye exhibits no  discharge. No scleral icterus.  Neck: Neck supple. No JVD present. No thyromegaly present.  Cardiovascular: Normal rate, regular rhythm and normal heart sounds.   No murmur heard. Pulmonary/Chest: She has no wheezes. She has no rales.  Musculoskeletal: She exhibits no edema.  Lymphadenopathy:    She has no cervical adenopathy.  Neurological: She is alert and oriented to person, place, and time. She has normal reflexes. No cranial nerve deficit. Coordination normal.  Skin: Skin is warm. No rash noted. She is not diaphoretic.  Psychiatric: She has a normal mood and affect. Her behavior is normal. Judgment and thought content normal.  Vitals reviewed.         Assessment & Plan:  Near syncopal episode-etiology unclear. Possible vasovagal reaction. Rule out cardiac and neurology etiologies.  History of Mnire's disease-did not have vertigo with this episode  Plan: Patient will have lab work, CT of the brain, 24-hour Holter monitor. Return in 2 weeks. Consider carotid Dopplers. EKG is within normal limits.

## 2014-09-11 ENCOUNTER — Ambulatory Visit
Admission: RE | Admit: 2014-09-11 | Discharge: 2014-09-11 | Disposition: A | Discharge status: Home / Self Care | Payer: Medicare Other | Source: Ambulatory Visit | Attending: Internal Medicine | Admitting: Internal Medicine

## 2014-09-11 DIAGNOSIS — R55 Syncope and collapse: Secondary | ICD-10-CM

## 2014-09-12 ENCOUNTER — Telehealth: Payer: Self-pay | Admitting: *Deleted

## 2014-09-12 NOTE — Telephone Encounter (Signed)
Reviewed CT results with patient.

## 2014-09-15 ENCOUNTER — Ambulatory Visit (INDEPENDENT_AMBULATORY_CARE_PROVIDER_SITE_OTHER): Payer: Medicare Other

## 2014-09-15 DIAGNOSIS — R55 Syncope and collapse: Secondary | ICD-10-CM

## 2014-09-25 ENCOUNTER — Ambulatory Visit (INDEPENDENT_AMBULATORY_CARE_PROVIDER_SITE_OTHER): Payer: Medicare Other | Admitting: Internal Medicine

## 2014-09-25 VITALS — BP 114/74 | HR 68 | Temp 97.9°F | Ht 63.0 in | Wt 154.0 lb

## 2014-09-25 DIAGNOSIS — G4700 Insomnia, unspecified: Secondary | ICD-10-CM | POA: Diagnosis not present

## 2014-09-25 DIAGNOSIS — M859 Disorder of bone density and structure, unspecified: Secondary | ICD-10-CM | POA: Diagnosis not present

## 2014-09-25 DIAGNOSIS — R5383 Other fatigue: Secondary | ICD-10-CM

## 2014-09-25 DIAGNOSIS — F411 Generalized anxiety disorder: Secondary | ICD-10-CM | POA: Diagnosis not present

## 2014-09-25 DIAGNOSIS — R55 Syncope and collapse: Secondary | ICD-10-CM

## 2014-09-25 DIAGNOSIS — Z23 Encounter for immunization: Secondary | ICD-10-CM | POA: Diagnosis not present

## 2014-09-25 DIAGNOSIS — R6889 Other general symptoms and signs: Secondary | ICD-10-CM | POA: Diagnosis not present

## 2014-09-25 LAB — IRON AND TIBC
%SAT: 18 % (ref 11–50)
Iron: 51 ug/dL (ref 45–160)
TIBC: 285 ug/dL (ref 250–450)
UIBC: 234 ug/dL (ref 125–400)

## 2014-09-26 ENCOUNTER — Ambulatory Visit (HOSPITAL_COMMUNITY)
Admission: RE | Admit: 2014-09-26 | Discharge: 2014-09-26 | Disposition: A | Discharge status: Home / Self Care | Payer: Medicare Other | Source: Ambulatory Visit | Attending: Internal Medicine | Admitting: Internal Medicine

## 2014-09-26 ENCOUNTER — Telehealth: Payer: Self-pay | Admitting: *Deleted

## 2014-09-26 DIAGNOSIS — R55 Syncope and collapse: Secondary | ICD-10-CM | POA: Insufficient documentation

## 2014-09-26 LAB — FOLATE

## 2014-09-26 LAB — VITAMIN D 25 HYDROXY (VIT D DEFICIENCY, FRACTURES): VIT D 25 HYDROXY: 43 ng/mL (ref 30–100)

## 2014-09-26 LAB — VITAMIN B12: Vitamin B-12: 760 pg/mL (ref 211–911)

## 2014-09-26 NOTE — Telephone Encounter (Signed)
Reviewed lab results with patient.

## 2014-09-26 NOTE — Progress Notes (Signed)
Preliminary results by tech - Carotid Duplex Completed. No evidence of stenosis noted in the right or left carotid arteries.Bilateral vertebral arteries demonstrate normal antegrade flow. Oda Cogan, BS, RDMS, RVT

## 2014-09-27 ENCOUNTER — Encounter: Payer: Self-pay | Admitting: Internal Medicine

## 2014-09-27 NOTE — Progress Notes (Addendum)
   Subjective:    Patient ID: Sabrina English, female    DOB: 11/09/1933, 81 y.o.   MRN: [id number]  HPI Patient is here today to follow-up on near syncopal episode. She had a CT of the brain that did not show any hemorrhage or stroke. 24-hour Holter monitor was essentially within normal limits. Her TSH was slightly low but free T4 was normal. CBC was normal. Electrolytes were normal. EKG at last visit was normal. She's had no further symptoms. She has felt fatigued however.    Review of Systems     Objective:   Physical Exam  Skin warm and dry. Nodes none. TMs are clear. Neck is supple without JVD thyromegaly or carotid bruits. Chest clear. Cardiac exam regular rate and rhythm. Extremities without edema. Neuro: no focal deficits      Assessment & Plan:  Near syncopal episode-? Vasovagal near syncope. Could've had cardiac arrhythmia that was not evident on 24-hour Holter monitor. Will screen for carotid disease. No evidence of CVA or hemorrhage.  Fatigue-etiology unclear. Free T4 normal. Check B-12 and folate levels. Check iron level. Patient is anxious.  Insomnia-Xanax 0.25 mg at bedtime when necessary  Plan: To have carotid Doppler studies.  Addendum: Carotid Doppler studies are negative.

## 2014-09-27 NOTE — Patient Instructions (Signed)
Call if symptoms recur. Workup is been negative thus far.

## 2014-10-01 ENCOUNTER — Telehealth: Payer: Self-pay | Admitting: *Deleted

## 2014-10-01 NOTE — Telephone Encounter (Signed)
Reviewed doppler results with patient

## 2014-11-03 DIAGNOSIS — Z79899 Other long term (current) drug therapy: Secondary | ICD-10-CM | POA: Diagnosis not present

## 2014-11-03 DIAGNOSIS — H918X3 Other specified hearing loss, bilateral: Secondary | ICD-10-CM | POA: Diagnosis not present

## 2014-11-03 DIAGNOSIS — M270 Developmental disorders of jaws: Secondary | ICD-10-CM | POA: Diagnosis not present

## 2014-11-03 DIAGNOSIS — E785 Hyperlipidemia, unspecified: Secondary | ICD-10-CM | POA: Diagnosis not present

## 2014-11-03 DIAGNOSIS — H6123 Impacted cerumen, bilateral: Secondary | ICD-10-CM | POA: Diagnosis not present

## 2014-11-03 DIAGNOSIS — H61303 Acquired stenosis of external ear canal, unspecified, bilateral: Secondary | ICD-10-CM | POA: Diagnosis not present

## 2014-11-03 DIAGNOSIS — J342 Deviated nasal septum: Secondary | ICD-10-CM | POA: Diagnosis not present

## 2014-11-03 DIAGNOSIS — H903 Sensorineural hearing loss, bilateral: Secondary | ICD-10-CM | POA: Diagnosis not present

## 2014-12-01 ENCOUNTER — Encounter: Payer: Self-pay | Admitting: Internal Medicine

## 2014-12-01 ENCOUNTER — Ambulatory Visit (INDEPENDENT_AMBULATORY_CARE_PROVIDER_SITE_OTHER): Payer: Medicare Other | Admitting: Internal Medicine

## 2014-12-01 VITALS — BP 120/80 | HR 68 | Temp 97.8°F | Resp 20 | Wt 156.0 lb

## 2014-12-01 DIAGNOSIS — J3489 Other specified disorders of nose and nasal sinuses: Secondary | ICD-10-CM | POA: Diagnosis not present

## 2014-12-01 MED ORDER — MINOCYCLINE HCL 50 MG PO CAPS: 50.0000 mg | ORAL_CAPSULE | Freq: Two times a day (BID) | ORAL | Status: DC

## 2014-12-01 NOTE — Progress Notes (Signed)
   Subjective:    Patient ID: Sabrina English, female    DOB: 1933-01-21, 81 y.o.   MRN: [id number]  HPI For approximately  5 weeks has had sore upper rim right nostril that will not heal. She's tried Neosporin and Bactroban without success. She has dermatologist, Dr. Jarome Matin. She's leaving on trip to [facility] in the next few days.  She is upset that some of her lab work that she obtained when she was complaining of fatigue was not paid for. We will try to contact [facility]  lab regarding this. She's called about this previously. We will contact our liaison, Hurman Horn with [employer].    Review of Systems     Objective:   Physical Exam  Has abraded area with scab upper inner renal right nostril with surrounding erythema      Assessment & Plan:  Abrasion right nostril  Plan: Continue Bactroban twice daily. Doxycycline 100 mg twice daily for 7 days. If no improvement in 2 weeks, see dermatologist.  Addendum: Doxycycline not covered on insurance plan. Change to minocycline 50 mg twice daily for 7 days

## 2014-12-01 NOTE — Patient Instructions (Addendum)
Continue Bactroban twice daily. Minocycline 50 mg twice daily for 7 days. If no improvement in 2 weeks, see dermatologist

## 2014-12-02 ENCOUNTER — Telehealth: Payer: Self-pay | Admitting: Internal Medicine

## 2014-12-02 MED ORDER — MUPIROCIN 2 % EX OINT: TOPICAL_OINTMENT | CUTANEOUS | Status: DC

## 2014-12-02 NOTE — Telephone Encounter (Signed)
Bactroban refilled 

## 2014-12-02 NOTE — Telephone Encounter (Signed)
Calls stating that she noticed that the Bactroban that she's using is dated 2008.   Wants to know if that could possibly be the reason that it's not getting any better?  Should she have a new Bactroban Rx called in?    Pharmacy:  [facility] @ [address]  Thanks!

## 2015-02-19 DIAGNOSIS — H43813 Vitreous degeneration, bilateral: Secondary | ICD-10-CM | POA: Diagnosis not present

## 2015-02-19 DIAGNOSIS — H353122 Nonexudative age-related macular degeneration, left eye, intermediate dry stage: Secondary | ICD-10-CM | POA: Diagnosis not present

## 2015-02-19 DIAGNOSIS — H353112 Nonexudative age-related macular degeneration, right eye, intermediate dry stage: Secondary | ICD-10-CM | POA: Diagnosis not present

## 2015-02-28 ENCOUNTER — Other Ambulatory Visit: Payer: Self-pay | Admitting: Internal Medicine

## 2015-03-02 ENCOUNTER — Other Ambulatory Visit: Payer: Self-pay | Admitting: Internal Medicine

## 2015-05-11 DIAGNOSIS — M270 Developmental disorders of jaws: Secondary | ICD-10-CM | POA: Diagnosis not present

## 2015-05-11 DIAGNOSIS — J342 Deviated nasal septum: Secondary | ICD-10-CM | POA: Diagnosis not present

## 2015-05-11 DIAGNOSIS — H6123 Impacted cerumen, bilateral: Secondary | ICD-10-CM | POA: Diagnosis not present

## 2015-05-11 DIAGNOSIS — H918X3 Other specified hearing loss, bilateral: Secondary | ICD-10-CM | POA: Diagnosis not present

## 2015-05-11 DIAGNOSIS — H903 Sensorineural hearing loss, bilateral: Secondary | ICD-10-CM | POA: Diagnosis not present

## 2015-05-11 DIAGNOSIS — H61303 Acquired stenosis of external ear canal, unspecified, bilateral: Secondary | ICD-10-CM | POA: Diagnosis not present

## 2015-05-27 ENCOUNTER — Encounter: Payer: Self-pay | Admitting: Internal Medicine

## 2015-05-27 DIAGNOSIS — M8589 Other specified disorders of bone density and structure, multiple sites: Secondary | ICD-10-CM | POA: Diagnosis not present

## 2015-05-27 DIAGNOSIS — Z1231 Encounter for screening mammogram for malignant neoplasm of breast: Secondary | ICD-10-CM | POA: Diagnosis not present

## 2015-06-05 ENCOUNTER — Other Ambulatory Visit: Payer: Medicare Other | Admitting: Internal Medicine

## 2015-06-05 DIAGNOSIS — E785 Hyperlipidemia, unspecified: Secondary | ICD-10-CM

## 2015-06-05 DIAGNOSIS — Z1329 Encounter for screening for other suspected endocrine disorder: Secondary | ICD-10-CM | POA: Diagnosis not present

## 2015-06-05 DIAGNOSIS — M8580 Other specified disorders of bone density and structure, unspecified site: Secondary | ICD-10-CM

## 2015-06-05 DIAGNOSIS — Z0000 Encounter for general adult medical examination without abnormal findings: Secondary | ICD-10-CM | POA: Diagnosis not present

## 2015-06-05 DIAGNOSIS — Z130 Encounter for screening for diseases of the blood and blood-forming organs and certain disorders involving the immune mechanism: Secondary | ICD-10-CM | POA: Diagnosis not present

## 2015-06-05 LAB — LIPID PANEL
CHOL/HDL RATIO: 1.5 ratio (ref <=5.0)
CHOLESTEROL: 198 mg/dL (ref 125–200)
HDL: 128 mg/dL (ref >=46)
LDL Cholesterol: 55 mg/dL (ref <130)
Triglycerides: 76 mg/dL (ref <150)
VLDL: 15 mg/dL (ref <30)

## 2015-06-05 LAB — COMPLETE METABOLIC PANEL WITHOUT GFR
ALT: 17 U/L (ref 6–29)
AST: 23 U/L (ref 10–35)
Albumin: 3.9 g/dL (ref 3.6–5.1)
Alkaline Phosphatase: 79 U/L (ref 33–130)
BUN: 31 mg/dL — ABNORMAL HIGH (ref 7–25)
CO2: 26 mmol/L (ref 20–31)
Calcium: 9.6 mg/dL (ref 8.6–10.4)
Chloride: 103 mmol/L (ref 98–110)
Creat: 1.31 mg/dL — ABNORMAL HIGH (ref 0.60–0.88)
GFR, Est African American: 44 mL/min — ABNORMAL LOW (ref >=60)
GFR, Est Non African American: 38 mL/min — ABNORMAL LOW (ref >=60)
Glucose, Bld: 90 mg/dL (ref 65–99)
Potassium: 3.8 mmol/L (ref 3.5–5.3)
Sodium: 142 mmol/L (ref 135–146)
Total Bilirubin: 0.7 mg/dL (ref 0.2–1.2)
Total Protein: 6.5 g/dL (ref 6.1–8.1)

## 2015-06-05 LAB — CBC WITH DIFFERENTIAL/PLATELET
BASOS PCT: 0 %
Basophils Absolute: 0 cells/uL (ref 0–200)
EOS ABS: 114 {cells}/uL (ref 15–500)
EOS PCT: 3 %
HEMATOCRIT: 38.1 % (ref 35.0–45.0)
HEMOGLOBIN: 13.1 g/dL (ref 11.7–15.5)
LYMPHS ABS: 1596 {cells}/uL (ref 850–3900)
Lymphocytes Relative: 42 %
MCH: 29.6 pg (ref 27.0–33.0)
MCHC: 34.4 g/dL (ref 32.0–36.0)
MCV: 86 fL (ref 80.0–100.0)
MONO ABS: 456 {cells}/uL (ref 200–950)
MPV: 8.8 fL (ref 7.5–12.5)
Monocytes Relative: 12 %
NEUTROS ABS: 1634 {cells}/uL (ref 1500–7800)
Neutrophils Relative %: 43 %
Platelets: 286 10*3/uL (ref 140–400)
RBC: 4.43 MIL/uL (ref 3.80–5.10)
RDW: 13.6 % (ref 11.0–15.0)
WBC: 3.8 10*3/uL (ref 3.8–10.8)

## 2015-06-05 LAB — TSH: TSH: 0.23 m[IU]/L — ABNORMAL LOW

## 2015-06-06 LAB — VITAMIN D 25 HYDROXY (VIT D DEFICIENCY, FRACTURES): VIT D 25 HYDROXY: 44 ng/mL (ref 30–100)

## 2015-06-08 ENCOUNTER — Ambulatory Visit
Admission: RE | Admit: 2015-06-08 | Discharge: 2015-06-08 | Disposition: A | Discharge status: Home / Self Care | Payer: Medicare Other | Source: Ambulatory Visit | Attending: Internal Medicine | Admitting: Internal Medicine

## 2015-06-08 ENCOUNTER — Ambulatory Visit (INDEPENDENT_AMBULATORY_CARE_PROVIDER_SITE_OTHER): Payer: Medicare Other | Admitting: Internal Medicine

## 2015-06-08 ENCOUNTER — Encounter: Payer: Self-pay | Admitting: Internal Medicine

## 2015-06-08 ENCOUNTER — Telehealth: Payer: Self-pay | Admitting: Internal Medicine

## 2015-06-08 VITALS — BP 122/68 | HR 72 | Temp 97.9°F | Resp 18 | Wt 153.0 lb

## 2015-06-08 DIAGNOSIS — R059 Cough, unspecified: Secondary | ICD-10-CM

## 2015-06-08 DIAGNOSIS — R05 Cough: Secondary | ICD-10-CM | POA: Diagnosis not present

## 2015-06-08 DIAGNOSIS — R7989 Other specified abnormal findings of blood chemistry: Secondary | ICD-10-CM

## 2015-06-08 DIAGNOSIS — Z0000 Encounter for general adult medical examination without abnormal findings: Secondary | ICD-10-CM

## 2015-06-08 DIAGNOSIS — M8580 Other specified disorders of bone density and structure, unspecified site: Secondary | ICD-10-CM

## 2015-06-08 DIAGNOSIS — H8109 Meniere's disease, unspecified ear: Secondary | ICD-10-CM

## 2015-06-08 DIAGNOSIS — E785 Hyperlipidemia, unspecified: Secondary | ICD-10-CM | POA: Diagnosis not present

## 2015-06-08 DIAGNOSIS — H3530 Unspecified macular degeneration: Secondary | ICD-10-CM | POA: Diagnosis not present

## 2015-06-08 DIAGNOSIS — H9193 Unspecified hearing loss, bilateral: Secondary | ICD-10-CM

## 2015-06-08 DIAGNOSIS — G4700 Insomnia, unspecified: Secondary | ICD-10-CM

## 2015-06-08 DIAGNOSIS — R748 Abnormal levels of other serum enzymes: Secondary | ICD-10-CM

## 2015-06-08 LAB — POCT URINALYSIS DIPSTICK
Bilirubin, UA: NEGATIVE
Glucose, UA: NEGATIVE
Ketones, UA: NEGATIVE
Leukocytes, UA: NEGATIVE
Nitrite, UA: NEGATIVE
PROTEIN UA: NEGATIVE
RBC UA: NEGATIVE
SPEC GRAV UA: 1.01
UROBILINOGEN UA: 0.2
pH, UA: 7

## 2015-06-08 NOTE — Telephone Encounter (Signed)
Appointment made with Dr. Bruna Potter office Wed. 7/5 @ 9:00 a.m. (first available).  He's only in the office on Tuesday and Wednesday.  Patient is to be off of antihistamine's x 3 days prior to appointment.    Dr. [name] [address] [id number] [phone] 07/08/15 @ 0900  Patient notified of date/time/address of appointment.  She'll call their office if she needs to change anything.

## 2015-06-08 NOTE — Patient Instructions (Signed)
Return in 3 months for follow-up on elevated serum creatinine. Please be well hydrated when you return at that time. Otherwise continue same medications.

## 2015-06-08 NOTE — Progress Notes (Signed)
Subjective:    Patient ID: Sabrina English, female    DOB: 06-04-1933, 82 y.o.   MRN: [id number]  HPI 82 year old Female for health maintenance and evaluation of medical issues. Lab work reviewed. Has elevated serum creatinine fasting. Likely has some chronic kidney disease but no history of HTN or DM. Will repeat Basic metabolic panel in 3 months. With office visit. New complaint today is cough that she says has been going on since the Spring of 2016 after a bout of pneumonia. Seems to be worse in the morning. Has never been allergy tested. Have ordered chest x-ray.  Immunizations are up to date.  History of Mnire's disease and she sees Dr. Lu Duffel at [facility] every 6 months. Doing well from that standpoint.  History of macular degeneration-bilateral.  History of hearing loss. Currently does not use hearing aids.  History of osteopenia.  Sometimes has a feeling of esophageal spasm when eating food.  No known drug allergies  Takes Zocor for hyperlipidemia, calcium and vitamin D supplements for osteopenia. She takes Maxide 25 for Mnire's disease and meclizine as needed for vertigo. Takes a baby aspirin daily. Takes potassium supplement because she is on diuretic for Mnire's disease.   History of C7 radiculopathy. History of left bunionectomy 1996.  Patient had melanoma left shoulder 1992 treated at [facility] with immunotherapy per Dr. Geroge Baseman.  Social history: She is a widow. 3 adult children, 2 daughters and a son. 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 She does not smoke or consume alcohol.  Family history: Father died at age 70 of prostate cancer. Mother died at age 51 of Alzheimer's disease complications. Sister died at age 54 with chronic lung disease. One sister died of colon cancer at age 60. One brother in good health. Younger brother in fair health.  Used to take Fosamax for osteopenia but no longer  does so.        Review of Systems  Constitutional: Negative.   HENT: Negative.   Respiratory: Positive for cough.        Some cough in am.  Cardiovascular: Negative.   Genitourinary: Negative.   Neurological:       Hx of Meniere's disease followed by Dr. Lu Duffel in [facility]  Hematological: Negative.   Psychiatric/Behavioral:       Anxious       Objective:   Physical Exam  Constitutional: She is oriented to person, place, and time. She appears well-developed and well-nourished. No distress.  HENT:  Head: Normocephalic and atraumatic.  Right Ear: External ear normal.  Left Ear: External ear normal.  Mouth/Throat: Oropharynx is clear and moist. No oropharyngeal exudate.  Eyes: Conjunctivae and EOM are normal. Pupils are equal, round, and reactive to light. Right eye exhibits no discharge. Left eye exhibits no discharge. No scleral icterus.  Neck: Neck supple. No JVD present. No thyromegaly present.  Cardiovascular: Normal rate, regular rhythm and normal heart sounds.   No murmur heard. Pulmonary/Chest: Effort normal and breath sounds normal. She has no wheezes. She has no rales.  Breasts normal female without masses  Abdominal: Soft. Bowel sounds are normal. She exhibits no distension and no mass. There is no tenderness. There is no rebound and no guarding.  Genitourinary:  Pap deferred  Musculoskeletal: She exhibits no edema.  Neurological: She is alert and oriented to person, place, and time. She has normal reflexes. No cranial nerve  deficit. Coordination normal.  Skin: Skin is warm and dry. No rash noted. She is not diaphoretic.  Psychiatric: She has a normal mood and affect. Her behavior is normal. Judgment and thought content normal.  Vitals reviewed.         Assessment & Plan:  Mnire's disease-stable  Osteopenia  Persistent cough status post treatment for pneumonia spring 2016-to see Dr. Neldon Mc regarding allergy testing and management  History of  macular degeneration  History of hearing loss  Status post melanoma left shoulder 1992  Insomnia-takes Xanax sparingly  History of hyperlipidemia treated with statin therapy  Plan: Her creatinine is at 1.31. This was done fasting. Previously was 1.14. Repeat in 3 months and follow-up at that time. Otherwise she generally comes once a year.  Subjective:   Patient presents for Medicare Annual/Subsequent preventive examination.  Review Past Medical/Family/Social:See above   Risk Factors  Current exercise habits: Mostly sedentary-does some light housework Dietary issues discussed: Low fat low carbohydrate  Cardiac risk factors:Hyperlipidemia  Depression Screen  (Note: if answer to either of the following is "Yes", a more complete depression screening is indicated)   Over the past two weeks, have you felt down, depressed or hopeless? No  Over the past two weeks, have you felt little interest or pleasure in doing things? No Have you lost interest or pleasure in daily life? No Do you often feel hopeless? No Do you cry easily over simple problems? No   Activities of Daily Living  In your present state of health, do you have any difficulty performing the following activities?:   Driving? No  Managing money? No  Feeding yourself? No  Getting from bed to chair? No  Climbing a flight of stairs? No  Preparing food and eating?: No  Bathing or showering? No  Getting dressed: No  Getting to the toilet? No  Using the toilet:No  Moving around from place to place: No  In the past year have you fallen or had a near fall?:No  Are you sexually active? No  Do you have more than one partner? No   Hearing Difficulties: No  Do you often ask people to speak up or repeat themselves? Yes Do you experience ringing or noises in your ears? Yes-history of Mnire's disease Do you have difficulty understanding soft or whispered voices? Yes Do you feel that you have a problem with memory? No Do  you often misplace items? No    Home Safety:  Do you have a smoke alarm at your residence? Yes Do you have grab bars in the bathroom?No Do you have throw rugs in your house? Yes   Cognitive Testing  Alert? Yes Normal Appearance?Yes  Oriented to person? Yes Place? Yes  Time? Yes  Recall of three objects? Yes  Can perform simple calculations? Yes  Displays appropriate judgment?Yes  Can read the correct time from a watch face?Yes   List the Names of Other Physician/Practitioners you currently use:  See referral list for the physicians patient is currently seeing.  Dr. Lu Duffel [facility]   Review of Systems: See above   Objective:     General appearance: Appears stated age and Thin Head: Normocephalic, without obvious abnormality, atraumatic  Eyes: conj clear, EOMi PEERLA  Ears: normal TM's and external ear canals both ears  Nose: Nares normal. Septum midline. Mucosa normal. No drainage or sinus tenderness.  Throat: lips, mucosa, and tongue normal; teeth and gums normal  Neck: no adenopathy, no carotid bruit, no  JVD, supple, symmetrical, trachea midline and thyroid not enlarged, symmetric, no tenderness/mass/nodules  No CVA tenderness.  Lungs: clear to auscultation bilaterally  Breasts: normal appearance, no masses or tenderness Heart: regular rate and rhythm, S1, S2 normal, no murmur, click, rub or gallop  Abdomen: soft, non-tender; bowel sounds normal; no masses, no organomegaly  Musculoskeletal: ROM normal in all joints, no crepitus, no deformity, Normal muscle strengthen. Back  is symmetric, no curvature. Skin: Skin color, texture, turgor normal. No rashes or lesions  Lymph nodes: Cervical, supraclavicular, and axillary nodes normal.  Neurologic: CN 2 -12 Normal, Normal symmetric reflexes. Normal coordination and gait  Psych: Alert & Oriented x 3, Mood appear stable.    Assessment:    Annual wellness medicare exam   Plan:    During the course  of the visit the patient was educated and counseled about appropriate screening and preventive services including:   Annual mammogram  3 Hemoccult cards distributed     Patient Instructions (the written plan) was given to the patient.  Medicare Attestation  I have personally reviewed:  The patient's medical and social history  Their use of alcohol, tobacco or illicit drugs  Their current medications and supplements  The patient's functional ability including ADLs,fall risks, home safety risks, cognitive, and hearing and visual impairment  Diet and physical activities  Evidence for depression or mood disorders  The patient's weight, height, BMI, and visual acuity have been recorded in the chart. I have made referrals, counseling, and provided education to the patient based on review of the above and I have provided the patient with a written personalized care plan for preventive services.

## 2015-06-29 ENCOUNTER — Other Ambulatory Visit (INDEPENDENT_AMBULATORY_CARE_PROVIDER_SITE_OTHER): Payer: Medicare Other | Admitting: Internal Medicine

## 2015-06-29 DIAGNOSIS — Z1211 Encounter for screening for malignant neoplasm of colon: Secondary | ICD-10-CM

## 2015-06-29 LAB — HEMOCCULT GUIAC POC 1CARD (OFFICE)
Card #3 Fecal Occult Blood, POC: POSITIVE
FECAL OCCULT BLD: POSITIVE
FECAL OCCULT BLD: POSITIVE — AB

## 2015-06-29 NOTE — Progress Notes (Signed)
   Subjective:    Patient ID: Sabrina English, female    DOB: 1933-12-08, 82 y.o.   MRN: [id number]  HPI patient returned 3 Hemoccult cards all of which are positive. Refer to GI for further evaluation.    Review of Systems     Objective:   Physical Exam        Assessment & Plan:

## 2015-06-30 ENCOUNTER — Telehealth: Payer: Self-pay

## 2015-06-30 ENCOUNTER — Encounter: Payer: Self-pay | Admitting: Gastroenterology

## 2015-06-30 DIAGNOSIS — R195 Other fecal abnormalities: Secondary | ICD-10-CM

## 2015-06-30 NOTE — Telephone Encounter (Signed)
-----   Message from Elby Showers, MD sent at 06/29/2015  3:43 PM EDT ----- Occult blood is present. Refer to GI

## 2015-06-30 NOTE — Telephone Encounter (Signed)
Pt.notified

## 2015-07-08 ENCOUNTER — Encounter: Payer: Self-pay | Admitting: Allergy and Immunology

## 2015-07-08 ENCOUNTER — Ambulatory Visit (INDEPENDENT_AMBULATORY_CARE_PROVIDER_SITE_OTHER): Payer: Medicare Other | Admitting: Allergy and Immunology

## 2015-07-08 VITALS — BP 130/78 | HR 72 | Temp 98.1°F | Resp 16 | Ht 62.5 in | Wt 157.2 lb

## 2015-07-08 DIAGNOSIS — J3089 Other allergic rhinitis: Secondary | ICD-10-CM | POA: Diagnosis not present

## 2015-07-08 DIAGNOSIS — K219 Gastro-esophageal reflux disease without esophagitis: Secondary | ICD-10-CM

## 2015-07-08 DIAGNOSIS — R05 Cough: Secondary | ICD-10-CM | POA: Diagnosis not present

## 2015-07-08 DIAGNOSIS — J387 Other diseases of larynx: Secondary | ICD-10-CM | POA: Diagnosis not present

## 2015-07-08 DIAGNOSIS — R059 Cough, unspecified: Secondary | ICD-10-CM

## 2015-07-08 MED ORDER — MONTELUKAST SODIUM 10 MG PO TABS: 10.0000 mg | ORAL_TABLET | Freq: Every day | ORAL | Status: DC

## 2015-07-08 MED ORDER — RANITIDINE HCL 300 MG PO TABS: 300.0000 mg | ORAL_TABLET | Freq: Every evening | ORAL | Status: DC

## 2015-07-08 MED ORDER — OMEPRAZOLE 20 MG PO CPDR: 20.0000 mg | DELAYED_RELEASE_CAPSULE | ORAL | Status: DC

## 2015-07-08 NOTE — Patient Instructions (Addendum)
  1. Allergen avoidance measures?  2. Treat and prevent inflammation:   A. OTC Rhinocort one spray each nostril one time per day. Coupon.  B. montelukast 10 mg one tablet one time per day  3. Treat and prevent reflux:   A. eliminate all caffeine consumption  B. omeprazole 20 mg one tablet one time per day in morning  C. ranitidine 300 mg one tablet one time per day in evening  4. If needed: OTC antihistamine - Claritin/Allegra/Zyrtec  5. Return to clinic in 3 weeks or earlier if problem

## 2015-07-08 NOTE — Progress Notes (Signed)
Dear Dr. Renold Genta,  Thank you for referring Sabrina English to the [facility] on 07/08/2015.   Below is a summation of this patient's evaluation and recommendations.  Thank you for your referral. I will keep you informed about this patient's response to treatment.   If you have any questions please to do hestitate to contact me.   Sincerely,  Jiles Prows, MD [place]   ______________________________________________________________________    NEW PATIENT NOTE  Referring Provider: Elby Showers, MD Primary Provider: Elby Showers, MD Date of office visit: 07/08/2015    Subjective:   Chief Complaint:  Sabrina English (DOB: [birth year]) is a 82 y.o. female with a chief complaint of New Patient (Initial Visit)  who presents to the clinic on 07/08/2015 with the following problems:  HPI: Sabrina English presents this clinic in evaluation of cough. Apparently after sustaining a pneumonia 14 months ago she was left with a persistent cough. She has associated gagging and retching and occasionally white phlegm production. Her cough appears to be somewhat worse after laying down at nighttime. However, her cough can occur any time throughout the day and occasionally she gets spells of cough that are difficult to control. She does not have any associated shortness of breath or chest tightness or wheezing. She does have throat clearing because she feels as though there is something stuck in her throat that she cannot clear out. She does not have any swallowing difficulties. Imaging studies have not identified the source of her cough.  She does have occasional upper airway symptoms. She has nose blowing with clear rhinorrhea and sneezing. She apparently had a rather significant episode of sinusitis back approximately 5 years ago and completely lost her sense of smell and taste. This infection apparently took  several months to resolve and she's been slowly regaining her smell and taste ever since that event. She can now smell food across the room.  She does have this uncomfortable feeling in her stomach especially after laying down for which she will use dicyclomine about every night. She does have burping and occasional metallic taste in her mouth. She drinks tea about 3 times a week when she goes out to eat at a restaurant. She does not consume any chocolate nor does she consume any alcohol.  Past Medical History  Diagnosis Date  . Hyperlipidemia   . GERD (gastroesophageal reflux disease)   . Osteoporosis     osteopenia  . Meniere's disease   . Cancer ([facility])     melanoma l shoulder    Past Surgical History  Procedure Laterality Date  . Melanoma excision      left shoulder  . Axillary node dissection  1983  . Bunionectomy    . Cataract Bilateral       Medication List           ALPRAZolam 0.25 MG tablet  Commonly known as:  XANAX  TAKE 1 TABLET AT BEDTIME AS NEEDED FOR SLEEP     aspirin 81 MG EC tablet  Take 81 mg by mouth daily.     calcium-vitamin D 500-200 MG-UNIT tablet  Commonly known as:  OSCAL WITH D  Take 1 tablet by mouth daily.     cholecalciferol 1000 units tablet  Commonly known as:  VITAMIN D  Take 1,000 Units by mouth daily.     Cyclandelate Powd  by Does not apply route.  dicyclomine 20 MG tablet  Commonly known as:  BENTYL  Take 1 tablet (20 mg total) by mouth every 6 (six) hours.     LIPOFLAVONOID PO  Take by mouth.     meclizine 25 MG tablet  Commonly known as:  ANTIVERT  Take 1 tablet (25 mg total) by mouth 3 (three) times daily as needed.     simvastatin 20 MG tablet  Commonly known as:  ZOCOR  TAKE 1 TABLET BY MOUTH DAILY AT 6 PM.     triamterene-hydrochlorothiazide 37.5-25 MG capsule  Commonly known as:  DYAZIDE  TAKE ONE CAPSULE EVERY DAY        No Known Allergies  Review of systems negative except as noted in HPI / PMHx or  noted below:  Review of Systems  Constitutional: Negative.   HENT: Negative.   Eyes: Negative.   Respiratory: Negative.   Cardiovascular: Negative.   Gastrointestinal: Negative.   Genitourinary: Negative.   Musculoskeletal: Negative.   Skin: Negative.        History of melanoma treated with wide excision on back with follow-up left axillary lymph node dissection positive for melanoma treated at [facility] with immunotherapy 1983. No recurrence since.  Neurological: Negative.   Endo/Heme/Allergies: Negative.   Psychiatric/Behavioral: Negative.     Family History  Problem Relation Age of Onset  . Mental illness Mother   . Hyperlipidemia Mother   . Cancer Father   . Asthma Father   . Cancer Sister     Social History   Social History  . Marital Status: Married    Spouse Name: N/A  . Number of Children: N/A  . Years of Education: N/A   Occupational History  . retired    Social History Main Topics  . Smoking status: Never Smoker   . Smokeless tobacco: Never Used  . Alcohol Use: No  . Drug Use: No  . Sexual Activity: Not on file   Other Topics Concern  . Not on file   Social History Narrative    Environmental and Social history  Lives in a house with a dry environment, no animals located inside the household, carpeting in the bedroom, no plastic on the better pillow, and no smoking ongoing with inside the household.   Objective:   Filed Vitals:   07/08/15 0910  BP: 130/78  Pulse: 72  Temp: 98.1 F (36.7 C)  Resp: 16   Height: 5' 2.5" (158.8 cm) Weight: 157 lb 3.2 oz (71.305 kg)  Physical Exam  Constitutional: She is well-developed, well-nourished, and in no distress.  Throat clearing, coughing, tardive dyskinesia-like facial movements  HENT:  Head: Normocephalic. Head is without right periorbital erythema and without left periorbital erythema.  Right Ear: Tympanic membrane, external ear and ear canal normal.  Left Ear: Tympanic membrane,  external ear and ear canal normal.  Nose: Nose normal. No mucosal edema or rhinorrhea.  Mouth/Throat: Oropharynx is clear and moist and mucous membranes are normal. No oropharyngeal exudate.  Eyes: Conjunctivae and lids are normal. Pupils are equal, round, and reactive to light.  Neck: Trachea normal. No tracheal deviation present. No thyromegaly present.  Cardiovascular: Normal rate, regular rhythm, S1 normal, S2 normal and normal heart sounds.   No murmur heard. Pulmonary/Chest: Effort normal. No stridor. No tachypnea. No respiratory distress. She has no wheezes. She has no rales. She exhibits no tenderness.  Abdominal: Soft. She exhibits no distension and no mass. There is no hepatosplenomegaly. There is no tenderness. There is no rebound  and no guarding.  Musculoskeletal: She exhibits no edema or tenderness.  Lymphadenopathy:       Head (right side): No tonsillar adenopathy present.       Head (left side): No tonsillar adenopathy present.    She has no cervical adenopathy.    She has no axillary adenopathy.  Neurological: She is alert. Gait normal.  Skin: No rash noted. She is not diaphoretic. No erythema. No pallor. Nails show no clubbing.  Psychiatric: Mood and affect normal.     Diagnostics: Allergy skin tests were performed. She did not demonstrate any hypersensitivity against a screening panel of aeroallergens or foods.  Spirometry was performed and demonstrated an FEV1 of 1.38 @ 75 % of predicted. Following the administration of nebulized albuterol her FEV1 improved to 1.55 which was an increase in the FEV1 of 12%.  Results of a chest x-ray obtained on 06/08/2015 did not identify any significant abnormality  Results of a chest CT scan dated 05/23/2014 describes Somewhat nodular airspace opacity noted in the peripheral right middle lobe measuring up to 13 mm in diameter. Similar airspace opacity noted in the posterior inferior right lower lobe measuring up to 18 mm. Minimal  opacity in the lingula, likely scarring or atelectasis. Lungs are otherwise clear. No pleural effusions.  Results of a follow-up chest CT scan dated 08/08/2014 describes There is some trace scarring in the posterior right middle lobe. On the previous study, there was a 13 mm nodular area associated with the linear right middle lobe scarring. This nodular component has completely resolved in the interval. Patchy and nodular areas of airspace disease in the right lower lobe, including the 18 mm nodule, have also resolved completely in the interval.   Assessment and Plan:    1. Other allergic rhinitis   2. Cough   3. LPRD (laryngopharyngeal reflux disease)     1. Allergen avoidance measures?  2. Treat and prevent inflammation:   A. OTC Rhinocort one spray each nostril one time per day. Coupon.  B. montelukast 10 mg one tablet one time per day  3. Treat and prevent reflux:   A. eliminate all caffeine consumption  B. omeprazole 20 mg one tablet one time per day in morning  C. ranitidine 300 mg one tablet one time per day in evening  4. If needed: OTC antihistamine - Claritin/Allegra/Zyrtec  5. Return to clinic in 3 weeks or earlier if problem  Gaea has a history that is quite consistent with laryngopharyngeal reflux and I'll empirically treat her for this condition as noted above and as well given the fact that she does have some upper airway symptoms and in the past has developed a rather significant episode of sinusitis associated with anosmia we'll have her use anti-inflammatory agents for her upper respiratory tract. I will not address the issue of possible asthma at this point in time. She did have some degree of bronchodilator-induced reversibility noticed on today's visit and we may need to approach this issue if she does not respond well to therapy described above. I'll regroup with her in approximately 3 weeks or so and we should have an idea about which direction she is going with  this approach.  Jiles Prows, MD [facility]

## 2015-07-15 DIAGNOSIS — Z961 Presence of intraocular lens: Secondary | ICD-10-CM | POA: Diagnosis not present

## 2015-07-15 DIAGNOSIS — H353123 Nonexudative age-related macular degeneration, left eye, advanced atrophic without subfoveal involvement: Secondary | ICD-10-CM | POA: Diagnosis not present

## 2015-07-15 DIAGNOSIS — Z0100 Encounter for examination of eyes and vision without abnormal findings: Secondary | ICD-10-CM | POA: Diagnosis not present

## 2015-07-15 DIAGNOSIS — H353113 Nonexudative age-related macular degeneration, right eye, advanced atrophic without subfoveal involvement: Secondary | ICD-10-CM | POA: Diagnosis not present

## 2015-08-04 ENCOUNTER — Encounter: Payer: Self-pay | Admitting: Allergy and Immunology

## 2015-08-04 ENCOUNTER — Encounter (INDEPENDENT_AMBULATORY_CARE_PROVIDER_SITE_OTHER): Payer: Self-pay

## 2015-08-04 ENCOUNTER — Ambulatory Visit (INDEPENDENT_AMBULATORY_CARE_PROVIDER_SITE_OTHER): Payer: Medicare Other | Admitting: Allergy and Immunology

## 2015-08-04 VITALS — BP 138/74 | HR 72 | Resp 20

## 2015-08-04 DIAGNOSIS — J3089 Other allergic rhinitis: Secondary | ICD-10-CM | POA: Diagnosis not present

## 2015-08-04 DIAGNOSIS — K219 Gastro-esophageal reflux disease without esophagitis: Secondary | ICD-10-CM

## 2015-08-04 DIAGNOSIS — J387 Other diseases of larynx: Secondary | ICD-10-CM

## 2015-08-04 MED ORDER — MONTELUKAST SODIUM 10 MG PO TABS: ORAL_TABLET | ORAL | 5 refills | Status: DC

## 2015-08-04 MED ORDER — OMEPRAZOLE 20 MG PO CPDR: DELAYED_RELEASE_CAPSULE | ORAL | 5 refills | Status: DC

## 2015-08-04 MED ORDER — RANITIDINE HCL 300 MG PO TABS: ORAL_TABLET | ORAL | 5 refills | Status: DC

## 2015-08-04 NOTE — Progress Notes (Signed)
Follow-up Note  Referring Provider: Elby Showers, MD Primary Provider: Elby Showers, MD Date of Office Visit: 08/04/2015  Subjective:   Sabrina English (DOB: Dec 03, 1933) is a 82 y.o. female who returns to the [facility] on 08/04/2015 in re-evaluation of the following:  HPI: Sabrina English presents to this clinic in evaluation of her LPR, cough, and allergic rhinitis. Since using medical therapy established on 07/08/2015 she has resolved her cough, her throat is a lot better, and she has no problems with her nose. This is the first time that she has not coughed in over a year. She's been very good about continuing to use medical therapy directed against both reflux and respiratory tract inflammation. She has eliminated all caffeine consumption.    Medication List      ALPRAZolam 0.25 MG tablet Commonly known as:  XANAX TAKE 1 TABLET AT BEDTIME AS NEEDED FOR SLEEP   aspirin 81 MG EC tablet Take 81 mg by mouth daily.   calcium-vitamin D 500-200 MG-UNIT tablet Commonly known as:  OSCAL WITH D Take 1 tablet by mouth daily.   cholecalciferol 1000 units tablet Commonly known as:  VITAMIN D Take 1,000 Units by mouth daily.   Cyclandelate Powd by Does not apply route.   dicyclomine 20 MG tablet Commonly known as:  BENTYL Take 1 tablet (20 mg total) by mouth every 6 (six) hours.   LIPOFLAVONOID PO Take by mouth.   meclizine 25 MG tablet Commonly known as:  ANTIVERT Take 1 tablet (25 mg total) by mouth 3 (three) times daily as needed.   montelukast 10 MG tablet Commonly known as:  SINGULAIR Take 1 tablet (10 mg total) by mouth at bedtime.   omeprazole 20 MG capsule Commonly known as:  PRILOSEC Take 1 capsule (20 mg total) by mouth every morning.   ranitidine 300 MG tablet Commonly known as:  ZANTAC Take 1 tablet (300 mg total) by mouth every evening.   simvastatin 20 MG tablet Commonly known as:  ZOCOR TAKE 1 TABLET BY MOUTH DAILY AT 6 PM.     triamterene-hydrochlorothiazide 37.5-25 MG capsule Commonly known as:  DYAZIDE TAKE ONE CAPSULE EVERY DAY       Past Medical History:  Diagnosis Date  . Cancer ([facility])    melanoma l shoulder  . GERD (gastroesophageal reflux disease)   . Hyperlipidemia   . Meniere's disease   . Osteoporosis    osteopenia    Past Surgical History:  Procedure Laterality Date  . AXILLARY NODE DISSECTION  1983  . BUNIONECTOMY    . CATARACT Bilateral   . MELANOMA EXCISION     left shoulder    No Known Allergies  Review of systems negative except as noted in HPI / PMHx or noted below:  Review of Systems  Constitutional: Negative.   HENT: Negative.   Eyes: Negative.   Respiratory: Negative.   Cardiovascular: Negative.   Gastrointestinal: Negative.   Genitourinary: Negative.   Musculoskeletal: Negative.   Skin: Negative.   Neurological: Negative.   Endo/Heme/Allergies: Negative.   Psychiatric/Behavioral: Negative.      Objective:   Vitals:   08/04/15 0822  BP: 138/74  Pulse: 72  Resp: 20          Physical Exam  Constitutional: She is well-developed, well-nourished, and in no distress.  HENT:  Head: Normocephalic.  Right Ear: Tympanic membrane, external ear and ear canal normal.  Left Ear: Tympanic membrane, external ear and ear canal normal.  Nose: Nose normal. No mucosal edema or rhinorrhea.  Mouth/Throat: Uvula is midline, oropharynx is clear and moist and mucous membranes are normal. No oropharyngeal exudate.  Eyes: Conjunctivae are normal.  Neck: Trachea normal. No tracheal tenderness present. No tracheal deviation present. No thyromegaly present.  Cardiovascular: Normal rate, regular rhythm, S1 normal, S2 normal and normal heart sounds.   No murmur heard. Pulmonary/Chest: Breath sounds normal. No stridor. No respiratory distress. She has no wheezes. She has no rales.  Musculoskeletal: She exhibits no edema.  Lymphadenopathy:       Head (right side): No tonsillar  adenopathy present.       Head (left side): No tonsillar adenopathy present.    She has no cervical adenopathy.  Neurological: She is alert. Gait normal.  Skin: No rash noted. She is not diaphoretic. No erythema. Nails show no clubbing.  Psychiatric: Mood and affect normal.    Diagnostics: None   Assessment and Plan:   1. Other allergic rhinitis   2. LPRD (laryngopharyngeal reflux disease)     1. Allergen avoidance measures  2. Continue to Treat and prevent inflammation:   A. OTC Rhinocort one spray each nostril one time per day. Coupon.  B. montelukast 10 mg one tablet one time per day  3. Continue to Treat and prevent reflux:   A. eliminate all caffeine consumption  B. omeprazole 20 mg one tablet one time per day in morning  C. ranitidine 300 mg one tablet one time per day in evening  4. If needed: OTC antihistamine - Claritin/Allegra/Zyrtec  5. Return to clinic in 8 weeks or earlier if problem  6. Obtain fall flu vaccine  Sabrina English has done very well on her initial medical therapy and I'm going to continue to have her use the plan noted above directed against both respiratory tract inflammation and reflux for a full 12 weeks and thus I'll see her back in this clinic in 8 weeks. That point in time there may be an opportunity to consolidate her medical treatment.  [name], MD [facility]

## 2015-08-04 NOTE — Patient Instructions (Signed)
  1. Allergen avoidance measures  2. Continue to Treat and prevent inflammation:   A. OTC Rhinocort one spray each nostril one time per day. Coupon.  B. montelukast 10 mg one tablet one time per day  3. Continue to Treat and prevent reflux:   A. eliminate all caffeine consumption  B. omeprazole 20 mg one tablet one time per day in morning  C. ranitidine 300 mg one tablet one time per day in evening  4. If needed: OTC antihistamine - Claritin/Allegra/Zyrtec  5. Return to clinic in 8 weeks or earlier if problem  6. Obtain fall flu vaccine

## 2015-09-02 DIAGNOSIS — H353112 Nonexudative age-related macular degeneration, right eye, intermediate dry stage: Secondary | ICD-10-CM | POA: Diagnosis not present

## 2015-09-02 DIAGNOSIS — H353122 Nonexudative age-related macular degeneration, left eye, intermediate dry stage: Secondary | ICD-10-CM | POA: Diagnosis not present

## 2015-09-03 ENCOUNTER — Ambulatory Visit (INDEPENDENT_AMBULATORY_CARE_PROVIDER_SITE_OTHER): Payer: Medicare Other | Admitting: Gastroenterology

## 2015-09-03 ENCOUNTER — Other Ambulatory Visit (INDEPENDENT_AMBULATORY_CARE_PROVIDER_SITE_OTHER): Payer: Medicare Other

## 2015-09-03 ENCOUNTER — Encounter: Payer: Self-pay | Admitting: Gastroenterology

## 2015-09-03 VITALS — BP 104/70 | HR 72 | Ht 63.0 in | Wt 154.4 lb

## 2015-09-03 DIAGNOSIS — R1310 Dysphagia, unspecified: Secondary | ICD-10-CM

## 2015-09-03 DIAGNOSIS — R195 Other fecal abnormalities: Secondary | ICD-10-CM

## 2015-09-03 LAB — CBC WITH DIFFERENTIAL/PLATELET
BASOS PCT: 0.6 % (ref 0.0–3.0)
Basophils Absolute: 0 10*3/uL (ref 0.0–0.1)
EOS ABS: 0.1 10*3/uL (ref 0.0–0.7)
EOS PCT: 1.3 % (ref 0.0–5.0)
HEMATOCRIT: 38 % (ref 36.0–46.0)
HEMOGLOBIN: 12.9 g/dL (ref 12.0–15.0)
LYMPHS PCT: 32.9 % (ref 12.0–46.0)
Lymphs Abs: 1.8 10*3/uL (ref 0.7–4.0)
MCHC: 34 g/dL (ref 30.0–36.0)
MCV: 84.7 fl (ref 78.0–100.0)
Monocytes Absolute: 0.5 10*3/uL (ref 0.1–1.0)
Monocytes Relative: 8.6 % (ref 3.0–12.0)
Neutro Abs: 3.2 10*3/uL (ref 1.4–7.7)
Neutrophils Relative %: 56.6 % (ref 43.0–77.0)
Platelets: 279 10*3/uL (ref 150.0–400.0)
RBC: 4.49 Mil/uL (ref 3.87–5.11)
RDW: 13.6 % (ref 11.5–15.5)
WBC: 5.6 10*3/uL (ref 4.0–10.5)

## 2015-09-03 MED ORDER — NA SULFATE-K SULFATE-MG SULF 17.5-3.13-1.6 GM/177ML PO SOLN: 1.0000 | Freq: Once | ORAL | 0 refills | Status: AC

## 2015-09-03 NOTE — Progress Notes (Signed)
[facility] Consult Note:  History: Sabrina English 09/03/2015  Referring physician: Elby Showers, MD  Reason for consult/chief complaint: Blood In Stools (Pt has never seen blood, denies rectal/abdominal pain)   Subjective  HPI:  Sabrina English was referred by Dr. Renold Genta for heme positive stool discovered after a well visit in early June. Married denies altered bowel habits abdominal pain or rectal bleeding. She has been troubled by a chronic cough for which she recently saw either allergy or ENT, and was put on once daily PPI. She feels that this has decreased her cough. She denies chronic pyrosis, but for the last several months has had more frequent episodes of solid food dysphagia. It will happen unpredictably, but food will feel stuck in the chest as if there is a "spasm or tightness". She then might have to go to the bathroom to bring the food back up, or more commonly this tightness will  release and the food will pass. She denies anorexia or weight loss. [place]'s last colonoscopy was at least 10 years ago by her report. We have no procedure reports in our system  ROS:  Review of Systems  Constitutional: Negative for appetite change and unexpected weight change.  HENT: Negative for mouth sores and voice change.   Eyes: Negative for pain and redness.  Respiratory: Negative for cough and shortness of breath.   Cardiovascular: Negative for chest pain and palpitations.  Genitourinary: Negative for dysuria and hematuria.  Musculoskeletal: Positive for arthralgias. Negative for myalgias.  Skin: Negative for pallor and rash.  Allergic/Immunologic: Positive for environmental allergies.  Neurological: Negative for weakness and headaches.  Hematological: Negative for adenopathy.  Psychiatric/Behavioral: The patient is nervous/anxious.      Past Medical History: Past Medical History:  Diagnosis Date  . Cancer ([facility])    melanoma l shoulder  . GERD (gastroesophageal reflux  disease)   . Hyperlipidemia   . Meniere's disease   . Osteoporosis    osteopenia     Past Surgical History: Past Surgical History:  Procedure Laterality Date  . AXILLARY NODE DISSECTION  1983  . BUNIONECTOMY    . CATARACT Bilateral   . MELANOMA EXCISION     left shoulder     Family History: Family History  Problem Relation Age of Onset  . Mental illness Mother   . Hyperlipidemia Mother   . Cancer Father   . Asthma Father   . Cancer Sister   half sister - colon cancer  Social History: Social History   Social History  . Marital status: Married    Spouse name: N/A  . Number of children: N/A  . Years of education: N/A   Occupational History  . retired    Social History Main Topics  . Smoking status: Never Smoker  . Smokeless tobacco: Never Used  . Alcohol use No  . Drug use: No  . Sexual activity: Not Asked   Other Topics Concern  . None   Social History Narrative  . None    Allergies: No Known Allergies  Outpatient Meds: Current Outpatient Prescriptions  Medication Sig Dispense Refill  . ALPRAZolam (XANAX) 0.25 MG tablet TAKE 1 TABLET AT BEDTIME AS NEEDED FOR SLEEP    . aspirin 81 MG EC tablet Take 81 mg by mouth daily.      . budesonide (RHINOCORT ALLERGY) 32 MCG/ACT nasal spray Place 1 spray into both nostrils daily.    . calcium-vitamin D (OSCAL WITH D) 500-200 MG-UNIT per tablet Take 1 tablet by mouth  daily.      . cholecalciferol (VITAMIN D) 1000 UNITS tablet Take 1,000 Units by mouth daily.    . Cyclandelate POWD by Does not apply route.    . dicyclomine (BENTYL) 20 MG tablet Take 1 tablet (20 mg total) by mouth every 6 (six) hours. 120 tablet 1  . meclizine (ANTIVERT) 25 MG tablet Take 1 tablet (25 mg total) by mouth 3 (three) times daily as needed. 30 tablet 11  . montelukast (SINGULAIR) 10 MG tablet TAKE ONE TABLET ONCE DAILY 30 tablet 5  . omeprazole (PRILOSEC) 20 MG capsule TAKE ONE CAPSULE EVERY MORNING BEFORE BREAKFAST 30 capsule 5  .  ranitidine (ZANTAC) 300 MG tablet TAKE ONE TABLET DAILY AT BEDTIME 30 tablet 5  . simvastatin (ZOCOR) 20 MG tablet TAKE 1 TABLET BY MOUTH DAILY AT 6 PM. 90 tablet 3  . triamterene-hydrochlorothiazide (DYAZIDE) 37.5-25 MG capsule TAKE ONE CAPSULE EVERY DAY 90 capsule 3  . Vitamins-Lipotropics (LIPOFLAVONOID PO) Take by mouth.    . Na Sulfate-K Sulfate-Mg Sulf 17.5-3.13-1.6 GM/180ML SOLN Take 1 kit by mouth once. 354 mL 0   No current facility-administered medications for this visit.       ___________________________________________________________________ Objective   Exam:  BP 104/70   Pulse 72   Ht '5\' 3"'  (1.6 m)   Wt 154 lb 6 oz (70 kg)   BMI 27.35 kg/m    General: this is a(n) Well-appearing elderly woman   Eyes: sclera anicteric, no redness, no conjunctival pallor  ENT: oral mucosa moist without lesions, no cervical or supraclavicular lymphadenopathy, good dentition  CV: RRR without murmur, S1/S2, no JVD, no peripheral edema  Resp: clear to auscultation bilaterally, normal RR and effort noted  GI: soft, no tenderness, with active bowel sounds. No guarding or palpable organomegaly noted.  Skin; warm and dry, no rash or jaundice noted, no pallor  Neuro: awake, alert and oriented x 3. Normal gross motor function and fluent speech  Labs:  CBC    Component Value Date/Time   WBC 3.8 06/05/2015 0949   RBC 4.43 06/05/2015 0949   HGB 13.1 06/05/2015 0949   HCT 38.1 06/05/2015 0949   PLT 286 06/05/2015 0949   MCV 86.0 06/05/2015 0949   MCV 84.8 05/07/2014 1115   MCH 29.6 06/05/2015 0949   MCHC 34.4 06/05/2015 0949   RDW 13.6 06/05/2015 0949   LYMPHSABS 1,596 06/05/2015 0949   MONOABS 456 06/05/2015 0949   EOSABS 114 06/05/2015 0949   BASOSABS 0 06/05/2015 0949    Assessment: Encounter Diagnoses  Name Primary?  . Heme positive stool Yes  . Dysphagia     Difficult to tell if her upper symptoms are mechanical or motility. Significance of the heme positive  stool is unclear with no lower GI symptoms and a recently normal hemoglobin.  Plan:  CBC  EGD with possible dilation and colonoscopy  The benefits and risks of the planned procedure were described in detail with the patient or (when appropriate) their health care proxy.  Risks were outlined as including, but not limited to, bleeding, infection, perforation, adverse medication reaction leading to cardiac or pulmonary decompensation, or pancreatitis (if ERCP).  The limitation of incomplete mucosal visualization was also discussed.  No guarantees or warranties were given.  Continue iron supplements, dose according to blood count.  Thank you for the courtesy of this consult.  Please call me with any questions or concerns.  Nelida Meuse III  CC: Elby Showers, MD

## 2015-09-03 NOTE — Patient Instructions (Signed)
If you are age 80 or older, your body mass index should be between 23-30. Your Body mass index is 27.35 kg/m. If this is out of the aforementioned range listed, please consider follow up with your Primary Care Provider.  If you are age 65 or younger, your body mass index should be between 19-25. Your Body mass index is 27.35 kg/m. If this is out of the aformentioned range listed, please consider follow up with your Primary Care Provider.   You have been scheduled for an endoscopy and colonoscopy. Please follow the written instructions given to you at your visit today. Please pick up your prep supplies at the pharmacy within the next 1-3 days. If you use inhalers (even only as needed), please bring them with you on the day of your procedure. Your physician has requested that you go to www.startemmi.com and enter the access code given to you at your visit today. This web site gives a general overview about your procedure. However, you should still follow specific instructions given to you by our office regarding your preparation for the procedure.  Thank you for choosing [facility]  Dr Wilfrid Lund III

## 2015-09-04 ENCOUNTER — Telehealth: Payer: Self-pay | Admitting: Gastroenterology

## 2015-09-04 NOTE — Telephone Encounter (Signed)
Pt notified of her lab results. See cbc report.

## 2015-09-08 ENCOUNTER — Other Ambulatory Visit: Payer: Medicare Other | Admitting: Internal Medicine

## 2015-09-08 DIAGNOSIS — Z0000 Encounter for general adult medical examination without abnormal findings: Secondary | ICD-10-CM | POA: Diagnosis not present

## 2015-09-08 DIAGNOSIS — E785 Hyperlipidemia, unspecified: Secondary | ICD-10-CM | POA: Diagnosis not present

## 2015-09-08 DIAGNOSIS — H8109 Meniere's disease, unspecified ear: Secondary | ICD-10-CM | POA: Diagnosis not present

## 2015-09-08 LAB — BASIC METABOLIC PANEL
BUN: 31 mg/dL — AB (ref 7–25)
CHLORIDE: 104 mmol/L (ref 98–110)
CO2: 30 mmol/L (ref 20–31)
Calcium: 9.4 mg/dL (ref 8.6–10.4)
Creat: 1.25 mg/dL — ABNORMAL HIGH (ref 0.60–0.88)
GLUCOSE: 91 mg/dL (ref 65–99)
POTASSIUM: 4 mmol/L (ref 3.5–5.3)
Sodium: 142 mmol/L (ref 135–146)

## 2015-09-11 ENCOUNTER — Encounter: Payer: Self-pay | Admitting: Internal Medicine

## 2015-09-11 ENCOUNTER — Ambulatory Visit (INDEPENDENT_AMBULATORY_CARE_PROVIDER_SITE_OTHER): Payer: Medicare Other | Admitting: Internal Medicine

## 2015-09-11 VITALS — BP 112/72 | HR 77 | Temp 97.6°F | Ht 63.0 in | Wt 154.0 lb

## 2015-09-11 DIAGNOSIS — R05 Cough: Secondary | ICD-10-CM | POA: Diagnosis not present

## 2015-09-11 DIAGNOSIS — Z23 Encounter for immunization: Secondary | ICD-10-CM

## 2015-09-11 DIAGNOSIS — H8109 Meniere's disease, unspecified ear: Secondary | ICD-10-CM

## 2015-09-11 DIAGNOSIS — R059 Cough, unspecified: Secondary | ICD-10-CM

## 2015-09-11 MED ORDER — MECLIZINE HCL 25 MG PO TABS: 25.0000 mg | ORAL_TABLET | Freq: Three times a day (TID) | ORAL | 11 refills | Status: DC | PRN

## 2015-09-24 ENCOUNTER — Ambulatory Visit (AMBULATORY_SURGERY_CENTER): Payer: Medicare Other | Admitting: Gastroenterology

## 2015-09-24 ENCOUNTER — Encounter: Payer: Self-pay | Admitting: Gastroenterology

## 2015-09-24 VITALS — BP 147/71 | HR 66 | Temp 98.0°F | Resp 11 | Ht 63.0 in | Wt 154.0 lb

## 2015-09-24 DIAGNOSIS — R195 Other fecal abnormalities: Secondary | ICD-10-CM

## 2015-09-24 DIAGNOSIS — R1310 Dysphagia, unspecified: Secondary | ICD-10-CM | POA: Diagnosis not present

## 2015-09-24 MED ORDER — SODIUM CHLORIDE 0.9 % IV SOLN: 500.0000 mL | INTRAVENOUS | Status: DC

## 2015-09-24 NOTE — Op Note (Signed)
[facility] Patient Name: Dessie Preval Procedure Date: 09/24/2015 2:05 PM MRN: [id number] Endoscopist: [place]. Loletha Carrow , MD Age: 82 Referring MD:  Date of Birth: 1933-04-28 Gender: Female Account #: [id number] Procedure:                Upper GI endoscopy Indications:              Dysphagia Medicines:                Monitored Anesthesia Care Procedure:                Pre-Anesthesia Assessment:                           - Prior to the procedure, a History and Physical                            was performed, and patient medications and                            allergies were reviewed. The patient's tolerance of                            previous anesthesia was also reviewed. The risks                            and benefits of the procedure and the sedation                            options and risks were discussed with the patient.                            All questions were answered, and informed consent                            was obtained. Anticoagulants: The patient has taken                            aspirin. It was decided not to withhold this                            medication prior to the procedure. ASA Grade                            Assessment: II - A patient with mild systemic                            disease. After reviewing the risks and benefits,                            the patient was deemed in satisfactory condition to                            undergo the procedure.  After obtaining informed consent, the endoscope was                            passed under direct vision. Throughout the                            procedure, the patient's blood pressure, pulse, and                            oxygen saturations were monitored continuously. The                            Model GIF-HQ190 [id number] scope was introduced                            through the mouth, and advanced to the second part             of duodenum. The upper GI endoscopy was                            accomplished without difficulty. The patient                            tolerated the procedure well. Scope In: Scope Out: Findings:                 The distal esophagus was moderately tortuous.                           A small hiatal hernia was present.                           A mild Schatzki ring (acquired) was found at the                            lower esophageal sphincter.                           The stomach was normal.                           The cardia and gastric fundus were normal on                            retroflexion.                           The examined duodenum was normal. Complications:            No immediate complications. Estimated Blood Loss:     Estimated blood loss: none. Impression:               - Tortuous esophagus.                           - Small hiatal hernia.                           -  Mild Schatzki ring.                           - Normal stomach.                           - Normal examined duodenum.                           - No specimens collected. Recommendation:           - Patient has a contact number available for                            emergencies. The signs and symptoms of potential                            delayed complications were discussed with the                            patient. Return to normal activities tomorrow.                            Written discharge instructions were provided to the                            patient.                           - Resume previous diet.                           - Continue present medications.                           - See the other procedure note for documentation of                            additional recommendations. Janely Gullickson L. Loletha Carrow, MD 09/24/2015 2:36:35 PM This report has been signed electronically.

## 2015-09-24 NOTE — Patient Instructions (Signed)
YOU HAD AN ENDOSCOPIC PROCEDURE TODAY AT THE [facility]:   Refer to the procedure report that was given to you for any specific questions about what was found during the examination.  If the procedure report does not answer your questions, please call your gastroenterologist to clarify.  If you requested that your care partner not be given the details of your procedure findings, then the procedure report has been included in a sealed envelope for you to review at your convenience later.  YOU SHOULD EXPECT: Some feelings of bloating in the abdomen. Passage of more gas than usual.  Walking can help get rid of the air that was put into your GI tract during the procedure and reduce the bloating. If you had a lower endoscopy (such as a colonoscopy or flexible sigmoidoscopy) you may notice spotting of blood in your stool or on the toilet paper. If you underwent a bowel prep for your procedure, you may not have a normal bowel movement for a few days.  Please Note:  You might notice some irritation and congestion in your nose or some drainage.  This is from the oxygen used during your procedure.  There is no need for concern and it should clear up in a day or so.  SYMPTOMS TO REPORT IMMEDIATELY:   Following lower endoscopy (colonoscopy or flexible sigmoidoscopy):  Excessive amounts of blood in the stool  Significant tenderness or worsening of abdominal pains  Swelling of the abdomen that is new, acute  Fever of 100F or higher   Following upper endoscopy (EGD)  Vomiting of blood or coffee ground material  New chest pain or pain under the shoulder blades  Painful or persistently difficult swallowing  New shortness of breath  Fever of 100F or higher  Black, tarry-looking stools  For urgent or emergent issues, a gastroenterologist can be reached at any hour by calling [phone].   DIET:  We do recommend a small meal at first, but then you may proceed to your regular diet.  Drink  plenty of fluids but you should avoid alcoholic beverages for 24 hours.  ACTIVITY:  You should plan to take it easy for the rest of today and you should NOT DRIVE or use heavy machinery until tomorrow (because of the sedation medicines used during the test).    FOLLOW UP: Our staff will call the number listed on your records the next business day following your procedure to check on you and address any questions or concerns that you may have regarding the information given to you following your procedure. If we do not reach you, we will leave a message.  However, if you are feeling well and you are not experiencing any problems, there is no need to return our call.  We will assume that you have returned to your regular daily activities without incident.  If any biopsies were taken you will be contacted by phone or by letter within the next 1-3 weeks.  Please call us at [phone] if you have not heard about the biopsies in 3 weeks.    SIGNATURES/CONFIDENTIALITY: You and/or your care partner have signed paperwork which will be entered into your electronic medical record.  These signatures attest to the fact that that the information above on your After Visit Summary has been reviewed and is understood.  Full responsibility of the confidentiality of this discharge information lies with you and/or your care-partner.   Information on diverticulosis given to you today 

## 2015-09-24 NOTE — Op Note (Signed)
Sabrina English Patient Name: Sabrina English Procedure Date: 09/24/2015 2:04 PM MRN: [id number] Endoscopist: [place]. Loletha Carrow , MD Age: 82 Referring MD:  Date of Birth: [birth year] Gender: Female Account #: [id number] Procedure:                Colonoscopy Indications:              Heme positive stool with normal hemoblogin Medicines:                Monitored Anesthesia Care Procedure:                Pre-Anesthesia Assessment:                           - Prior to the procedure, a History and Physical                            was performed, and patient medications and                            allergies were reviewed. The patient's tolerance of                            previous anesthesia was also reviewed. The risks                            and benefits of the procedure and the sedation                            options and risks were discussed with the patient.                            All questions were answered, and informed consent                            was obtained. Anticoagulants: The patient has taken                            aspirin. It was decided not to withhold this                            medication prior to the procedure. ASA Grade                            Assessment: II - A patient with mild systemic                            disease. After reviewing the risks and benefits,                            the patient was deemed in satisfactory condition to                            undergo the procedure.  After obtaining informed consent, the colonoscope                            was passed under direct vision. Throughout the                            procedure, the patient's blood pressure, pulse, and                            oxygen saturations were monitored continuously. The                            Model CF-HQ190L [id number]) scope was introduced                            through the anus and advanced to the the  cecum,                            identified by appendiceal orifice and ileocecal                            valve. The colonoscopy was performed with moderate                            difficulty due to multiple diverticula in the colon                            and a tortuous colon. Successful completion of the                            procedure was aided by applying abdominal pressure.                            The ileocecal valve, appendiceal orifice, and                            rectum were photographed. The quality of the bowel                            preparation was good. The quality of the bowel                            preparation was evaluated using the BBPS [facility]                            Bowel Preparation Scale) with scores of: Right                            Colon = 2, Transverse Colon = 2 and Left Colon = 2.                            The total BBPS score equals 6. The bowel  preparation used was SUPREP. Scope In: 2:12:20 PM Scope Out: 2:27:05 PM Scope Withdrawal Time: 0 hours 6 minutes 22 seconds  Total Procedure Duration: 0 hours 14 minutes 45 seconds  Findings:                 The perianal and digital rectal examinations were                            normal.                           Diverticula were found in the sigmoid colon. There                            was associated marked tortuosity.                           Retroflexion in the rectum was not performed due to                            anatomy.                           The exam was otherwise without abnormality. Complications:            No immediate complications. Estimated Blood Loss:     Estimated blood loss: none. Impression:               - Diverticulosis in the sigmoid colon.                           - The examination was otherwise normal.                           - No specimens collected. Recommendation:           - Patient has a contact number available  for                            emergencies. The signs and symptoms of potential                            delayed complications were discussed with the                            patient. Return to normal activities tomorrow.                            Written discharge instructions were provided to the                            patient.                           - Resume previous diet.                           - Continue present medications.                           -  No repeat routine colonoscopy or routine stool                            check for occult blood due to age. Sabrina English L. Loletha Carrow, MD 09/24/2015 2:41:12 PM This report has been signed electronically.

## 2015-09-24 NOTE — Progress Notes (Signed)
Spontaneous respirations throughout. VSS. Resting comfortably. To PACU on RA. Report to [place].

## 2015-09-24 NOTE — Progress Notes (Signed)
Diverticulosis otherwise negative.

## 2015-09-25 ENCOUNTER — Telehealth: Payer: Self-pay

## 2015-09-25 NOTE — Telephone Encounter (Signed)
  Follow up Call-  Call back number 09/24/2015  Post procedure Call Back phone  # [phone]  Permission to leave phone message Yes  Some recent data might be hidden     Patient questions:  Do you have a fever, pain , or abdominal swelling? No. Pain Score  0 *  Have you tolerated food without any problems? Yes.    Have you been able to return to your normal activities? Yes.    Do you have any questions about your discharge instructions: Diet   No. Medications  No. Follow up visit  No.  Do you have questions or concerns about your Care? No.  Actions: * If pain score is 4 or above: No action needed, pain <4.

## 2015-09-29 ENCOUNTER — Encounter: Payer: Self-pay | Admitting: Allergy and Immunology

## 2015-09-29 ENCOUNTER — Ambulatory Visit (INDEPENDENT_AMBULATORY_CARE_PROVIDER_SITE_OTHER): Payer: Medicare Other | Admitting: Allergy and Immunology

## 2015-09-29 VITALS — BP 112/64 | HR 64 | Resp 20

## 2015-09-29 DIAGNOSIS — J387 Other diseases of larynx: Secondary | ICD-10-CM | POA: Diagnosis not present

## 2015-09-29 DIAGNOSIS — R05 Cough: Secondary | ICD-10-CM | POA: Diagnosis not present

## 2015-09-29 DIAGNOSIS — J3089 Other allergic rhinitis: Secondary | ICD-10-CM

## 2015-09-29 DIAGNOSIS — R059 Cough, unspecified: Secondary | ICD-10-CM

## 2015-09-29 DIAGNOSIS — K219 Gastro-esophageal reflux disease without esophagitis: Secondary | ICD-10-CM

## 2015-09-29 NOTE — Patient Instructions (Addendum)
  1. Continue to Treat and prevent inflammation:   A. OTC Rhinocort one spray each nostril one time per day.   B. DISCONTINUE montelukast 10 mg TODAY  2. Continue to Treat and prevent reflux:   A. Continue to eliminate all caffeine consumption  B. omeprazole 20 mg one tablet one time per day in morning  C. DISCONTINUE ranitidine 300 mg IN TWO WEEKS  3. If needed: OTC antihistamine - Claritin/Allegra/Zyrtec  4. Return to clinic in 12 weeks or earlier if problem

## 2015-09-29 NOTE — Progress Notes (Signed)
Follow-up Note  Referring Provider: Elby Showers, MD Primary Provider: Elby Showers, MD Date of Office Visit: 09/29/2015  Subjective:   Sabrina English (DOB: 07/04/1933) is a 82 y.o. female who returns to the [facility] on 09/29/2015 in re-evaluation of the following:  HPI: [name] returns to this clinic in reevaluation of her cough and LPR and allergic rhinitis. I last saw her in his clinic in August 2017.  She has done great and has resolved all of her throat issues and her coughing and her nose issues. She's been consistently using medical therapy and eliminating all caffeine consumption    Medication List      ALPRAZolam 0.25 MG tablet Commonly known as:  XANAX TAKE 1 TABLET AT BEDTIME AS NEEDED FOR SLEEP   aspirin 81 MG EC tablet Take 81 mg by mouth daily.   calcium-vitamin D 500-200 MG-UNIT tablet Commonly known as:  OSCAL WITH D Take 1 tablet by mouth daily.   cholecalciferol 1000 units tablet Commonly known as:  VITAMIN D Take 1,000 Units by mouth daily.   Cyclandelate Powd by Does not apply route.   dicyclomine 20 MG tablet Commonly known as:  BENTYL Take 1 tablet (20 mg total) by mouth every 6 (six) hours.   LIPOFLAVONOID PO Take by mouth.   meclizine 25 MG tablet Commonly known as:  ANTIVERT Take 1 tablet (25 mg total) by mouth 3 (three) times daily as needed.   montelukast 10 MG tablet Commonly known as:  SINGULAIR TAKE ONE TABLET ONCE DAILY   omeprazole 20 MG capsule Commonly known as:  PRILOSEC TAKE ONE CAPSULE EVERY MORNING BEFORE BREAKFAST   ranitidine 300 MG tablet Commonly known as:  ZANTAC TAKE ONE TABLET DAILY AT BEDTIME   RHINOCORT ALLERGY 32 MCG/ACT nasal spray Generic drug:  budesonide Place 1 spray into both nostrils daily.   simvastatin 20 MG tablet Commonly known as:  ZOCOR TAKE 1 TABLET BY MOUTH DAILY AT 6 PM.   triamterene-hydrochlorothiazide 37.5-25 MG capsule Commonly known as:  DYAZIDE TAKE  ONE CAPSULE EVERY DAY       Past Medical History:  Diagnosis Date  . Cancer (HCC)    melanoma l shoulder  . GERD (gastroesophageal reflux disease)   . Hyperlipidemia   . Meniere's disease   . Osteoporosis    osteopenia    Past Surgical History:  Procedure Laterality Date  . AXILLARY NODE DISSECTION  1983  . BUNIONECTOMY    . CATARACT Bilateral   . MELANOMA EXCISION     left shoulder    No Known Allergies  Review of systems negative except as noted in HPI / PMHx or noted below:  Review of Systems  Constitutional: Negative.   HENT: Negative.   Eyes: Negative.   Respiratory: Negative.   Cardiovascular: Negative.   Gastrointestinal: Negative.   Genitourinary: Negative.   Musculoskeletal: Negative.   Skin: Negative.   Neurological: Negative.   Endo/Heme/Allergies: Negative.   Psychiatric/Behavioral: Negative.      Objective:   Vitals:   09/29/15 1619  BP: 112/64  Pulse: 64  Resp: 20          Physical Exam  Constitutional: She is well-developed, well-nourished, and in no distress.  HENT:  Head: Normocephalic.  Right Ear: Tympanic membrane, external ear and ear canal normal.  Left Ear: Tympanic membrane, external ear and ear canal normal.  Nose: Nose normal. No mucosal edema or rhinorrhea.  Mouth/Throat: Uvula is midline, oropharynx is clear  and moist and mucous membranes are normal. No oropharyngeal exudate.  Eyes: Conjunctivae are normal.  Neck: Trachea normal. No tracheal tenderness present. No tracheal deviation present. No thyromegaly present.  Cardiovascular: Normal rate, regular rhythm, S1 normal, S2 normal and normal heart sounds.   No murmur heard. Pulmonary/Chest: Breath sounds normal. No stridor. No respiratory distress. She has no wheezes. She has no rales.  Musculoskeletal: She exhibits no edema.  Lymphadenopathy:       Head (right side): No tonsillar adenopathy present.       Head (left side): No tonsillar adenopathy present.    She has  no cervical adenopathy.  Neurological: She is alert. Gait normal.  Skin: No rash noted. She is not diaphoretic. No erythema. Nails show no clubbing.  Psychiatric: Mood and affect normal.    Diagnostics: None   Assessment and Plan:   1. Other allergic rhinitis   2. LPRD (laryngopharyngeal reflux disease)   3. Cough     1. Continue to Treat and prevent inflammation:   A. OTC Rhinocort one spray each nostril one time per day.   B. DISCONTINUE montelukast 10 mg TODAY  2. Continue to Treat and prevent reflux:   A. Continue to eliminate all caffeine consumption  B. omeprazole 20 mg one tablet one time per day in morning  C. DISCONTINUE ranitidine 300 mg IN TWO WEEKS  3. If needed: OTC antihistamine - Claritin/Allegra/Zyrtec  4. Return to clinic in 12 weeks or earlier if problem  [name] is just doing wonderful on her therapy regarding her respiratory tract symptoms and now we'll see if we can consolidate her treatment as specified above which includes eliminating her leukotriene modifier today and then in 2 weeks discontinuing her H2 receptor blocker. I will see her back in this clinic in 12 weeks or may be a opportunity to further consolidate her medical therapy if she does well.  [name], MD [place]

## 2015-09-30 ENCOUNTER — Ambulatory Visit: Payer: Medicare Other | Admitting: Allergy and Immunology

## 2015-10-02 NOTE — Patient Instructions (Signed)
Continue treatment per Dr. Neldon Mc. Proceed with colonoscopy and endoscopy per gastroenterologist. Continue current medications. Physical exam due June 2018.

## 2015-10-02 NOTE — Progress Notes (Signed)
   Subjective:    Patient ID: Sabrina English, female    DOB: [birth year], 82 y.o.   MRN: [id number]  HPI 82 year old Female in today for follow-up on Mnire's disease and chronic cough. Has seen Dr. Neldon Mc. She's feeling much better with treatment per Dr. Neldon Mc including Rhinocort nasal spray, ranitidine 300 mg at bedtime, omeprazole 20 mg before breakfast and Singulair 10 mg daily. Has Antivert to take when necessary dizziness. Also takes a lipoflavonoid prescribed by ENT physician in [place]. She scheduled for colonoscopy in the near future because of a heme positive stool recent exam. Has had some issues with intermittent solid food dysphagia. Is to have endoscopy and colonoscopy in the near future.    Review of Systems     Objective:   Physical Exam Chest clear to auscultation without rales or wheezing. TMs and pharynx are clear. Affect is normal.       Assessment & Plan:  Mnire's disease  Chronic cough  Dysphagia  Heme positive stool  Plan: Continue treatment prescribed by Dr. Neldon Mc follow-up with him in December in follow-up with me for physical examination June 2018. Reminded to get mammogram. Flu vaccine given.  Addendum: 10/02/2015. Dr. Loletha Carrow, gastroenterologist found patient to have a mild Schatski's reading and a tortuous esophagitis. She has mild hiatal hernia. She had diverticulosis.

## 2015-11-13 DIAGNOSIS — H61303 Acquired stenosis of external ear canal, unspecified, bilateral: Secondary | ICD-10-CM | POA: Diagnosis not present

## 2015-11-13 DIAGNOSIS — H903 Sensorineural hearing loss, bilateral: Secondary | ICD-10-CM | POA: Diagnosis not present

## 2015-11-13 DIAGNOSIS — M270 Developmental disorders of jaws: Secondary | ICD-10-CM | POA: Diagnosis not present

## 2015-11-13 DIAGNOSIS — Z79899 Other long term (current) drug therapy: Secondary | ICD-10-CM | POA: Diagnosis not present

## 2015-11-13 DIAGNOSIS — H6123 Impacted cerumen, bilateral: Secondary | ICD-10-CM | POA: Diagnosis not present

## 2015-12-23 ENCOUNTER — Encounter: Payer: Self-pay | Admitting: Allergy and Immunology

## 2015-12-23 ENCOUNTER — Encounter (INDEPENDENT_AMBULATORY_CARE_PROVIDER_SITE_OTHER): Payer: Self-pay

## 2015-12-23 ENCOUNTER — Ambulatory Visit (INDEPENDENT_AMBULATORY_CARE_PROVIDER_SITE_OTHER): Payer: Medicare Other | Admitting: Allergy and Immunology

## 2015-12-23 VITALS — BP 140/70 | HR 64 | Resp 18

## 2015-12-23 DIAGNOSIS — K219 Gastro-esophageal reflux disease without esophagitis: Secondary | ICD-10-CM

## 2015-12-23 DIAGNOSIS — J3089 Other allergic rhinitis: Secondary | ICD-10-CM

## 2015-12-23 MED ORDER — IPRATROPIUM BROMIDE 0.06 % NA SOLN: NASAL | 5 refills | Status: DC

## 2015-12-23 NOTE — Patient Instructions (Signed)
  1. Continue to Treat and prevent inflammation:   A. OTC Rhinocort one spray each nostril one time per day.   2. Continue to Treat and prevent reflux:   A. Continue to eliminate all caffeine consumption  B. omeprazole 20 mg one tablet one time per day in morning  3. If needed:    A. OTC antihistamine - Claritin/Allegra/Zyrtec  B. Nasal ipratropium 0.06% 2 sprays each nostril every 6 hours to dry up nose.  4. Return to clinic in 6 months or earlier if problem  

## 2015-12-23 NOTE — Progress Notes (Signed)
Follow-up Note  Referring Provider: Elby Showers, MD Primary Provider: Elby Showers, MD Date of Office Visit: 12/23/2015  Subjective:   Sabrina English (DOB: 1933/05/16) is a 82 y.o. female who returns to the [facility] on 12/23/2015 in re-evaluation of the following:  HPI: Sabrina English returns to this clinic in reevaluation of her LPR with cough and allergic rhinitis. I last saw her in his clinic in September 2017 at which time we attempted to consolidate her medical therapy by discontinuing her ranitidine and discontinuing her montelukast.  She has continued to do very well. She does not have any cough and she does not have any throat clearing or postnasal drip or raspy voice. She has no problems with her nose other than the fact that she does get runny nose whenever she eats. This is becoming a significant issue for her because she now needs to eat with a tissue to tab her nose.  She remains away from consumption of all caffeine and chocolate.  She did receive the flu vaccine this year.  Allergies as of 12/23/2015   No Known Allergies     Medication List      ALPRAZolam 0.25 MG tablet Commonly known as:  XANAX TAKE 1 TABLET AT BEDTIME AS NEEDED FOR SLEEP   aspirin 81 MG EC tablet Take 81 mg by mouth daily.   calcium-vitamin D 500-200 MG-UNIT tablet Commonly known as:  OSCAL WITH D Take 1 tablet by mouth daily.   cholecalciferol 1000 units tablet Commonly known as:  VITAMIN D Take 1,000 Units by mouth daily.   Cyclandelate Powd by Does not apply route.   dicyclomine 20 MG tablet Commonly known as:  BENTYL Take 1 tablet (20 mg total) by mouth every 6 (six) hours.   LIPOFLAVONOID PO Take by mouth.   meclizine 25 MG tablet Commonly known as:  ANTIVERT Take 1 tablet (25 mg total) by mouth 3 (three) times daily as needed.   omeprazole 20 MG capsule Commonly known as:  PRILOSEC TAKE ONE CAPSULE EVERY MORNING BEFORE BREAKFAST   PRESERVISION  AREDS 2 PO Take by mouth 2 (two) times daily.   RHINOCORT ALLERGY 32 MCG/ACT nasal spray Generic drug:  budesonide Place 1 spray into both nostrils daily.   simvastatin 20 MG tablet Commonly known as:  ZOCOR TAKE 1 TABLET BY MOUTH DAILY AT 6 PM.   traMADol 50 MG tablet Commonly known as:  ULTRAM Take 50 mg by mouth.   triamterene-hydrochlorothiazide 37.5-25 MG capsule Commonly known as:  DYAZIDE TAKE ONE CAPSULE EVERY DAY       Past Medical History:  Diagnosis Date  . Cancer (HCC)    melanoma l shoulder  . GERD (gastroesophageal reflux disease)   . Hyperlipidemia   . Meniere's disease   . Osteoporosis    osteopenia    Past Surgical History:  Procedure Laterality Date  . AXILLARY NODE DISSECTION  1983  . BUNIONECTOMY    . CATARACT Bilateral   . MELANOMA EXCISION     left shoulder    Review of systems negative except as noted in HPI / PMHx or noted below:  Review of Systems  Constitutional: Negative.   HENT: Negative.   Eyes: Negative.   Respiratory: Negative.   Cardiovascular: Negative.   Gastrointestinal: Negative.   Genitourinary: Negative.   Musculoskeletal: Negative.   Skin: Negative.   Neurological: Negative.   Endo/Heme/Allergies: Negative.   Psychiatric/Behavioral: Negative.      Objective:  Vitals:   12/23/15 1121  BP: 140/70  Pulse: 64  Resp: 18          Physical Exam  Constitutional: She is well-developed, well-nourished, and in no distress.  HENT:  Head: Normocephalic.  Right Ear: Tympanic membrane, external ear and ear canal normal.  Left Ear: Tympanic membrane, external ear and ear canal normal.  Nose: Nose normal. No mucosal edema or rhinorrhea.  Mouth/Throat: Uvula is midline, oropharynx is clear and moist and mucous membranes are normal. No oropharyngeal exudate.  Eyes: Conjunctivae are normal.  Neck: Trachea normal. No tracheal tenderness present. No tracheal deviation present. No thyromegaly present.  Cardiovascular:  Normal rate, regular rhythm, S1 normal, S2 normal and normal heart sounds.   No murmur heard. Pulmonary/Chest: Breath sounds normal. No stridor. No respiratory distress. She has no wheezes. She has no rales.  Musculoskeletal: She exhibits no edema.  Lymphadenopathy:       Head (right side): No tonsillar adenopathy present.       Head (left side): No tonsillar adenopathy present.    She has no cervical adenopathy.  Neurological: She is alert. Gait normal.  Skin: No rash noted. She is not diaphoretic. No erythema. Nails show no clubbing.  Psychiatric: Mood and affect normal.    Diagnostics: none   Assessment and Plan:   1. LPRD (laryngopharyngeal reflux disease)   2. Other allergic rhinitis     1. Continue to Treat and prevent inflammation:   A. OTC Rhinocort one spray each nostril one time per day.   2. Continue to Treat and prevent reflux:   A. Continue to eliminate all caffeine consumption  B. omeprazole 20 mg one tablet one time per day in morning  3. If needed:    A. OTC antihistamine - Claritin/Allegra/Zyrtec  B. Nasal ipratropium 0.06% 2 sprays each nostril every 6 hours to dry up nose.  4. Return to clinic in 6 months or earlier if problem  Overall Sabrina English has done well on her current plan to address her LPR and her upper airway inflammation. We'll keep her on this plan at this point. I did add a drying agent for her upper airways that she can use prior to eating. If she continues to do well I will see her back in this clinic in 6 months or earlier if there is a problem.  [name], MD [place]

## 2016-01-22 ENCOUNTER — Telehealth: Payer: Self-pay | Admitting: Internal Medicine

## 2016-01-22 ENCOUNTER — Other Ambulatory Visit: Payer: Self-pay

## 2016-01-22 MED ORDER — ALPRAZOLAM 0.25 MG PO TABS: 0.2500 mg | ORAL_TABLET | Freq: Every evening | ORAL | 5 refills | Status: DC | PRN

## 2016-01-22 NOTE — Telephone Encounter (Signed)
Please refill x 6 months 

## 2016-01-22 NOTE — Telephone Encounter (Signed)
Done. Called in to [facility] on [address].

## 2016-01-22 NOTE — Telephone Encounter (Signed)
Patient says that she needs a refill on her Xanax 0.25 mg.    Pharmacy:  [facility] on [address].

## 2016-01-27 ENCOUNTER — Other Ambulatory Visit: Payer: Self-pay | Admitting: Allergy and Immunology

## 2016-02-12 ENCOUNTER — Telehealth: Payer: Self-pay | Admitting: Internal Medicine

## 2016-02-12 ENCOUNTER — Encounter: Payer: Self-pay | Admitting: Internal Medicine

## 2016-02-12 ENCOUNTER — Ambulatory Visit (INDEPENDENT_AMBULATORY_CARE_PROVIDER_SITE_OTHER): Payer: Medicare Other | Admitting: Internal Medicine

## 2016-02-12 VITALS — BP 128/82 | HR 70 | Temp 98.3°F | Wt 157.0 lb

## 2016-02-12 DIAGNOSIS — L03032 Cellulitis of left toe: Secondary | ICD-10-CM | POA: Diagnosis not present

## 2016-02-12 MED ORDER — DOXYCYCLINE HYCLATE 100 MG PO TABS: 100.0000 mg | ORAL_TABLET | Freq: Two times a day (BID) | ORAL | 0 refills | Status: DC

## 2016-02-12 NOTE — Telephone Encounter (Signed)
Patient calls with sore/boil on left great toe x 2 weeks.  Advised we will be happy to work her in this afternoon.  Gave appointment for 3:45 this afternoon.

## 2016-02-12 NOTE — Patient Instructions (Signed)
Soak in warm soapy water for 20 minutes twice daily. Doxycycline 100 mg twice daily for 10 days.

## 2016-02-12 NOTE — Progress Notes (Signed)
   Subjective:    Patient ID: Sabrina English, female    DOB: [birth year], 83 y.o.   MRN: [id number]  HPI  83 year old  Female in today with redness and tenderness left great toe for the past 2 weeks. She denies any injury to the toe or any ingrown toenail. No drainage from nail or surrounding nail area. Thought it would get better. Has noticed some blisterlike lesions below toenail. No fever or chills.  Tells me that Dr. Lu Duffel is starting her on a new medication for Mnire's disease because the old one is no longer available. New medication is Betahistine 8 mg daily.   Tetanus immunization given here in 2013  Review of Systems     Objective:   Physical Exam She has erythema adjacent to medial nail without drainage. She also has erythema below left great toe nail extending toward metatarsal. She has full range of motion in toe and foot.       Assessment & Plan:  Cellulitis Left great toe  Doxycycline 100 mg twice a day x 10 days. Soak in warm soapy water for 20 minutes twice daily. Call if not better by Tuesday, February 13 or sooner if worse.

## 2016-02-24 ENCOUNTER — Other Ambulatory Visit: Payer: Self-pay | Admitting: Internal Medicine

## 2016-02-26 ENCOUNTER — Other Ambulatory Visit: Payer: Self-pay | Admitting: Internal Medicine

## 2016-03-03 DIAGNOSIS — H353132 Nonexudative age-related macular degeneration, bilateral, intermediate dry stage: Secondary | ICD-10-CM | POA: Diagnosis not present

## 2016-04-12 DIAGNOSIS — K1379 Other lesions of oral mucosa: Secondary | ICD-10-CM | POA: Diagnosis not present

## 2016-05-03 DIAGNOSIS — K1379 Other lesions of oral mucosa: Secondary | ICD-10-CM | POA: Diagnosis not present

## 2016-05-16 DIAGNOSIS — M270 Developmental disorders of jaws: Secondary | ICD-10-CM | POA: Diagnosis not present

## 2016-05-16 DIAGNOSIS — H903 Sensorineural hearing loss, bilateral: Secondary | ICD-10-CM | POA: Diagnosis not present

## 2016-05-16 DIAGNOSIS — J342 Deviated nasal septum: Secondary | ICD-10-CM | POA: Diagnosis not present

## 2016-05-16 DIAGNOSIS — H61303 Acquired stenosis of external ear canal, unspecified, bilateral: Secondary | ICD-10-CM | POA: Diagnosis not present

## 2016-05-16 DIAGNOSIS — H6123 Impacted cerumen, bilateral: Secondary | ICD-10-CM | POA: Diagnosis not present

## 2016-06-07 ENCOUNTER — Other Ambulatory Visit: Payer: Medicare Other | Admitting: Internal Medicine

## 2016-06-07 DIAGNOSIS — Z0000 Encounter for general adult medical examination without abnormal findings: Secondary | ICD-10-CM

## 2016-06-07 DIAGNOSIS — E785 Hyperlipidemia, unspecified: Secondary | ICD-10-CM

## 2016-06-07 DIAGNOSIS — M8580 Other specified disorders of bone density and structure, unspecified site: Secondary | ICD-10-CM | POA: Diagnosis not present

## 2016-06-07 DIAGNOSIS — R5383 Other fatigue: Secondary | ICD-10-CM

## 2016-06-07 LAB — CBC WITH DIFFERENTIAL/PLATELET
BASOS PCT: 1 %
Basophils Absolute: 33 cells/uL (ref 0–200)
EOS ABS: 66 {cells}/uL (ref 15–500)
EOS PCT: 2 %
HCT: 39.9 % (ref 35.0–45.0)
Hemoglobin: 13 g/dL (ref 11.7–15.5)
LYMPHS PCT: 42 %
Lymphs Abs: 1386 cells/uL (ref 850–3900)
MCH: 28.4 pg (ref 27.0–33.0)
MCHC: 32.6 g/dL (ref 32.0–36.0)
MCV: 87.1 fL (ref 80.0–100.0)
MONOS PCT: 11 %
MPV: 9 fL (ref 7.5–12.5)
Monocytes Absolute: 363 cells/uL (ref 200–950)
NEUTROS ABS: 1452 {cells}/uL — AB (ref 1500–7800)
Neutrophils Relative %: 44 %
PLATELETS: 258 10*3/uL (ref 140–400)
RBC: 4.58 MIL/uL (ref 3.80–5.10)
RDW: 13.6 % (ref 11.0–15.0)
WBC: 3.3 10*3/uL — AB (ref 3.8–10.8)

## 2016-06-07 LAB — COMPLETE METABOLIC PANEL WITH GFR
ALT: 14 U/L (ref 6–29)
AST: 22 U/L (ref 10–35)
Albumin: 3.8 g/dL (ref 3.6–5.1)
Alkaline Phosphatase: 69 U/L (ref 33–130)
BUN: 26 mg/dL — ABNORMAL HIGH (ref 7–25)
CHLORIDE: 106 mmol/L (ref 98–110)
CO2: 28 mmol/L (ref 20–31)
CREATININE: 1.29 mg/dL — AB (ref 0.60–0.88)
Calcium: 9.5 mg/dL (ref 8.6–10.4)
GFR, EST AFRICAN AMERICAN: 45 mL/min — AB (ref >=60)
GFR, EST NON AFRICAN AMERICAN: 39 mL/min — AB (ref >=60)
GLUCOSE: 85 mg/dL (ref 65–99)
Potassium: 4.2 mmol/L (ref 3.5–5.3)
Sodium: 141 mmol/L (ref 135–146)
Total Bilirubin: 0.8 mg/dL (ref 0.2–1.2)
Total Protein: 6.2 g/dL (ref 6.1–8.1)

## 2016-06-07 LAB — LIPID PANEL
Cholesterol: 183 mg/dL (ref <200)
HDL: 110 mg/dL (ref >50)
LDL CALC: 57 mg/dL (ref <100)
TRIGLYCERIDES: 79 mg/dL (ref <150)
Total CHOL/HDL Ratio: 1.7 Ratio (ref <5.0)
VLDL: 16 mg/dL (ref <30)

## 2016-06-07 NOTE — Addendum Note (Signed)
Addended by: Drucilla Schmidt on: 06/07/2016 09:39 AM   Modules accepted: Orders

## 2016-06-08 LAB — TSH: TSH: 0.2 mIU/L — ABNORMAL LOW

## 2016-06-09 ENCOUNTER — Ambulatory Visit (INDEPENDENT_AMBULATORY_CARE_PROVIDER_SITE_OTHER): Payer: Medicare Other | Admitting: Internal Medicine

## 2016-06-09 ENCOUNTER — Encounter: Payer: Self-pay | Admitting: Internal Medicine

## 2016-06-09 VITALS — BP 118/74 | HR 68 | Temp 97.9°F | Ht 62.0 in | Wt 154.0 lb

## 2016-06-09 DIAGNOSIS — M8580 Other specified disorders of bone density and structure, unspecified site: Secondary | ICD-10-CM | POA: Diagnosis not present

## 2016-06-09 DIAGNOSIS — E784 Other hyperlipidemia: Secondary | ICD-10-CM

## 2016-06-09 DIAGNOSIS — E7849 Other hyperlipidemia: Secondary | ICD-10-CM

## 2016-06-09 DIAGNOSIS — Z0000 Encounter for general adult medical examination without abnormal findings: Secondary | ICD-10-CM | POA: Diagnosis not present

## 2016-06-09 DIAGNOSIS — H8109 Meniere's disease, unspecified ear: Secondary | ICD-10-CM

## 2016-06-09 DIAGNOSIS — H3530 Unspecified macular degeneration: Secondary | ICD-10-CM

## 2016-06-09 DIAGNOSIS — C4360 Malignant melanoma of unspecified upper limb, including shoulder: Secondary | ICD-10-CM

## 2016-06-09 DIAGNOSIS — G4700 Insomnia, unspecified: Secondary | ICD-10-CM | POA: Diagnosis not present

## 2016-06-09 DIAGNOSIS — H903 Sensorineural hearing loss, bilateral: Secondary | ICD-10-CM | POA: Diagnosis not present

## 2016-06-09 LAB — POCT URINALYSIS DIPSTICK
BILIRUBIN UA: NEGATIVE
Glucose, UA: NEGATIVE
Ketones, UA: NEGATIVE
Leukocytes, UA: NEGATIVE
NITRITE UA: NEGATIVE
PH UA: 5 (ref 5.0–8.0)
Protein, UA: NEGATIVE
RBC UA: NEGATIVE
SPEC GRAV UA: 1.015 (ref 1.010–1.025)
Urobilinogen, UA: 0.2 E.U./dL

## 2016-06-09 NOTE — Progress Notes (Signed)
Subjective:    Patient ID: Sabrina English, female    DOB: 22-Sep-1933, 83 y.o.   MRN: [id number]  HPI   83 year old Female for Medicare wellness exam, health maintenance exam and evaluation of medical issues.  Had near syncope recently in hot weather getting up from ground to standing position. Takes HCTZ for Menier's disease.She is not orthostatic today. Was advised to be sure and stay well-hydrated during hot weather and be careful with changing positions.  Mnire's disease is followed at [facility].  History of bilateral macular degeneration  History of hearing loss currently does not use hearing aids  History of osteopenia  Sometimes has an esophageal spasm when eating food  No known drug allergies  Take Zocor for hyperlipidemia, calcium and vitamin D supplements for osteopenia. She takes Maxide for Mnire's disease and meclizine as needed for vertigo. Takes a baby aspirin daily. She takes potassium supplement because she is on diuretic for Mnire's disease.  History of C7 radiculopathy currently not an issue. History of left bunionectomy 1996  Patient had melanoma left shoulder 1992 treated at [facility] with immunotherapy by Dr. Geroge Baseman.  Social history: She is a widow. 3 adult children -2 daughters in the sun. 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 She does not smoke or consume alcohol.  Family history: Father died at age 37 of prostate cancer. Mother died at age 29 of Alzheimer's disease complications. Sister died at age 69 with chronic lung disease. One sister died of colon cancer at age 14. One brother in good health. Younger brother in fair health.  Used to take Fosamax for osteopenia but no longer does so.      Review of Systems  Respiratory: Negative.   Cardiovascular: Negative.   Gastrointestinal: Negative.   Genitourinary: Negative.   Neurological:       Recent near-syncope?  Byam depletion  History of Mnire's disease  Psychiatric/Behavioral: Negative.        Objective:   Physical Exam  Constitutional: She is oriented to person, place, and time. She appears well-developed and well-nourished. No distress.  HENT:  Head: Normocephalic and atraumatic.  Right Ear: External ear normal.  Left Ear: External ear normal.  Mouth/Throat: Oropharynx is clear and moist.  Eyes: Conjunctivae and EOM are normal. Pupils are equal, round, and reactive to light. Right eye exhibits no discharge. Left eye exhibits no discharge. No scleral icterus.  Neck: Neck supple. No JVD present. No thyromegaly present.  Cardiovascular: Normal rate, regular rhythm, normal heart sounds and intact distal pulses.   No murmur heard. Pulmonary/Chest: Effort normal and breath sounds normal. No respiratory distress. She has no wheezes. She has no rales.  Abdominal: Soft. Bowel sounds are normal. She exhibits no distension and no mass. There is no tenderness. There is no rebound and no guarding.  Genitourinary:  Genitourinary Comments: Pap deferred  Musculoskeletal: She exhibits no edema.  Lymphadenopathy:    She has no cervical adenopathy.  Neurological: She is alert and oriented to person, place, and time. She has normal reflexes. No cranial nerve deficit. Coordination normal.  Skin: Skin is warm and dry. No rash noted. She is not diaphoretic.  Psychiatric: She has a normal mood and affect. Her behavior is normal. Thought content normal.  Vitals reviewed.         Assessment & Plan:  History of Mnire's disease. Had episode of near syncope recently. Not orthostatic  today. Needs to make sure she is well-hydrated during the summer months.  History of hearing loss  Status post melanoma left shoulder 1992  Insomnia-takes Xanax sparingly  History of hyperlipidemia treated with statin therapy  History of macular degeneration  Osteopenia-now just takes calcium and vitamin D. Formerly  took Fosamax.  History of low TSH with normal free T4  Chronic kidney disease stage III-continue to monitor  Laryngopharyngeal reflux disease followed by Dr. Neldon Mc  Allergic rhinitis-followed by Dr. Neldon Mc  Plan: Continue same medications and return in 6 months to follow-up on creatinine or as needed.  Subjective:   Patient presents for Medicare Annual/Subsequent preventive examination.  Review Past Medical/Family/Social:See above   Risk Factors  Current exercise habits: Mostly sedentary Dietary issues discussed: Low fat low carbohydrate  Cardiac risk factors: Hyperlipidemia  Depression Screen  (Note: if answer to either of the following is "Yes", a more complete depression screening is indicated)   Over the past two weeks, have you felt down, depressed or hopeless? No  Over the past two weeks, have you felt little interest or pleasure in doing things? No Have you lost interest or pleasure in daily life? No Do you often feel hopeless? No Do you cry easily over simple problems? No   Activities of Daily Living  In your present state of health, do you have any difficulty performing the following activities?:   Driving? No  Managing money? No  Feeding yourself? No  Getting from bed to chair? No  Climbing a flight of stairs? No  Preparing food and eating?: No  Bathing or showering? No  Getting dressed: No  Getting to the toilet? No  Using the toilet:No  Moving around from place to place: No  In the past year have you fallen or had a near fall?:yes Are you sexually active? No  Do you have more than one partner? No   Hearing Difficulties: yes-chronic hearing loss Do you often ask people to speak up or repeat themselves? yes Do you experience ringing or noises in your ears? yes Do you have difficulty understanding soft or whispered voices? yes Do you feel that you have a problem with memory? No Do you often misplace items? yes   Home Safety:  Do you have a smoke  alarm at your residence? Yes Do you have grab bars in the bathroom? No Do you have throw rugs in your house? Yes   Cognitive Testing  Alert? Yes Normal Appearance?Yes  Oriented to person? Yes Place? Yes  Time? Yes  Recall of three objects? Yes  Can perform simple calculations? Yes  Displays appropriate judgment?Yes  Can read the correct time from a watch face?Yes   List the Names of Other Physician/Practitioners you currently use:  See referral list for the physicians patient is currently seeing.     Review of Systems:   Objective:     General appearance: Appears stated age  Head: Normocephalic, without obvious abnormality, atraumatic  Eyes: conj clear, EOMi PEERLA  Ears: normal TM's and external ear canals both ears  Nose: Nares normal. Septum midline. Mucosa normal. No drainage or sinus tenderness.  Throat: lips, mucosa, and tongue normal; teeth and gums normal  Neck: no adenopathy, no carotid bruit, no JVD, supple, symmetrical, trachea midline and thyroid not enlarged, symmetric, no tenderness/mass/nodules  No CVA tenderness.  Lungs: clear to auscultation bilaterally  Breasts: normal appearance, no masses or tenderness,  Heart: regular rate and rhythm, S1, S2 normal, no murmur, click, rub or gallop  Abdomen: soft, non-tender; bowel sounds normal; no masses, no organomegaly  Musculoskeletal: ROM normal in all joints, no crepitus, no deformity, Normal muscle strengthen. Back  is symmetric, no curvature. Skin: Skin color, texture, turgor normal. No rashes or lesions  Lymph nodes: Cervical, supraclavicular, and axillary nodes normal.  Neurologic: CN 2 -12 Normal, Normal symmetric reflexes. Normal coordination and gait  Psych: Alert & Oriented x 3, Mood appear stable.    Assessment:    Annual wellness medicare exam   Plan:    During the course of the visit the patient was educated and counseled about appropriate screening and preventive services including:   Annual  flu vaccine     Patient Instructions (the written plan) was given to the patient.  Medicare Attestation  I have personally reviewed:  The patient's medical and social history  Their use of alcohol, tobacco or illicit drugs  Their current medications and supplements  The patient's functional ability including ADLs,fall risks, home safety risks, cognitive, and hearing and visual impairment  Diet and physical activities  Evidence for depression or mood disorders  The patient's weight, height, BMI, and visual acuity have been recorded in the chart. I have made referrals, counseling, and provided education to the patient based on review of the above and I have provided the patient with a written personalized care plan for preventive services.

## 2016-06-15 ENCOUNTER — Encounter: Payer: Self-pay | Admitting: Internal Medicine

## 2016-06-15 ENCOUNTER — Encounter: Payer: Self-pay | Admitting: Allergy and Immunology

## 2016-06-15 ENCOUNTER — Ambulatory Visit (INDEPENDENT_AMBULATORY_CARE_PROVIDER_SITE_OTHER): Payer: Medicare Other | Admitting: Allergy and Immunology

## 2016-06-15 VITALS — BP 112/70 | HR 72 | Temp 97.9°F | Resp 17

## 2016-06-15 DIAGNOSIS — J3089 Other allergic rhinitis: Secondary | ICD-10-CM | POA: Diagnosis not present

## 2016-06-15 DIAGNOSIS — K219 Gastro-esophageal reflux disease without esophagitis: Secondary | ICD-10-CM

## 2016-06-15 DIAGNOSIS — Z1231 Encounter for screening mammogram for malignant neoplasm of breast: Secondary | ICD-10-CM | POA: Diagnosis not present

## 2016-06-15 DIAGNOSIS — J310 Chronic rhinitis: Secondary | ICD-10-CM

## 2016-06-15 NOTE — Progress Notes (Signed)
Follow-up Note  Referring Provider: Elby Showers, MD Primary Provider: Elby Showers, MD Date of Office Visit: 06/15/2016  Subjective:   Sabrina English (DOB: [birth year]) is a 83 y.o. female who returns to the [facility] on 06/15/2016 in re-evaluation of the following:  HPI: Sabrina English returns to this clinic in evaluation of her LPR, allergic rhinitis, and gustatory rhinitis. I last saw her in this clinic December 2017.  She has really done very well during the interval. She has had very little cough and very little throat clearing and very little postnasal drip and no issues with her nose and while utilizing nasal ipratropium she has eliminated the development of gustatory rhinitis.  Over the past few days she did have some throat clearing and some drainage without any obvious trigger but this already appears to be improving somewhat.  Allergies as of 06/15/2016   No Known Allergies     Medication List      ALPRAZolam 0.25 MG tablet Commonly known as:  XANAX Take 1 tablet (0.25 mg total) by mouth at bedtime as needed. for sleep   aspirin 81 MG EC tablet Take 81 mg by mouth daily.   Betahistine Dihydrochloride Powd 8 mg.   calcium-vitamin D 500-200 MG-UNIT tablet Commonly known as:  OSCAL WITH D Take 1 tablet by mouth daily.   cholecalciferol 1000 units tablet Commonly known as:  VITAMIN D Take 1,000 Units by mouth daily.   dicyclomine 20 MG tablet Commonly known as:  BENTYL Take 1 tablet (20 mg total) by mouth every 6 (six) hours.   ipratropium 0.06 % nasal spray Commonly known as:  ATROVENT Use two sprays in each nostril every 6 hours if needed to dry up nose   LIPOFLAVONOID PO Take by mouth.   meclizine 25 MG tablet Commonly known as:  ANTIVERT Take 1 tablet (25 mg total) by mouth 3 (three) times daily as needed.   omeprazole 20 MG capsule Commonly known as:  PRILOSEC TAKE ONE CAPSULE EVERY MORNING BEFORE BREAKFAST   PRESERVISION  AREDS 2 PO Take by mouth 2 (two) times daily.   RHINOCORT ALLERGY 32 MCG/ACT nasal spray Generic drug:  budesonide Place 1 spray into both nostrils daily.   simvastatin 20 MG tablet Commonly known as:  ZOCOR TAKE 1 TABLET BY MOUTH DAILY AT 6 PM.   triamterene-hydrochlorothiazide 37.5-25 MG capsule Commonly known as:  DYAZIDE TAKE ONE CAPSULE EVERY DAY       Past Medical History:  Diagnosis Date  . Cancer (HCC)    melanoma l shoulder  . GERD (gastroesophageal reflux disease)   . Hyperlipidemia   . Meniere's disease   . Osteoporosis    osteopenia    Past Surgical History:  Procedure Laterality Date  . AXILLARY NODE DISSECTION  1983  . BUNIONECTOMY    . CATARACT Bilateral   . MELANOMA EXCISION     left shoulder    Review of systems negative except as noted in HPI / PMHx or noted below:  Review of Systems  Constitutional: Negative.   HENT: Negative.   Eyes: Negative.   Respiratory: Negative.   Cardiovascular: Negative.   Gastrointestinal: Negative.   Genitourinary: Negative.   Musculoskeletal: Negative.   Skin: Negative.   Neurological: Negative.   Endo/Heme/Allergies: Negative.   Psychiatric/Behavioral: Negative.      Objective:   Vitals:   06/15/16 1040  BP: 112/70  Pulse: 72  Resp: 17  Temp: 97.9 F (36.6 C)  Physical Exam  Constitutional: She is well-developed, well-nourished, and in no distress.  HENT:  Head: Normocephalic.  Right Ear: Tympanic membrane, external ear and ear canal normal.  Left Ear: Tympanic membrane, external ear and ear canal normal.  Nose: Nose normal. No mucosal edema or rhinorrhea.  Mouth/Throat: Uvula is midline, oropharynx is clear and moist and mucous membranes are normal. No oropharyngeal exudate.  Eyes: Conjunctivae are normal.  Neck: Trachea normal. No tracheal tenderness present. No tracheal deviation present. No thyromegaly present.  Cardiovascular: Normal rate, regular rhythm, S1 normal, S2 normal  and normal heart sounds.   No murmur heard. Pulmonary/Chest: Breath sounds normal. No stridor. No respiratory distress. She has no wheezes. She has no rales.  Musculoskeletal: She exhibits no edema.  Lymphadenopathy:       Head (right side): No tonsillar adenopathy present.       Head (left side): No tonsillar adenopathy present.    She has no cervical adenopathy.  Neurological: She is alert. Gait normal.  Skin: No rash noted. She is not diaphoretic. No erythema. Nails show no clubbing.  Psychiatric: Mood and affect normal.    Diagnostics: None  Assessment and Plan:   1. LPRD (laryngopharyngeal reflux disease)   2. Other allergic rhinitis   3. Gustatory rhinitis     1. Continue to Treat and prevent inflammation:   A. OTC Rhinocort one spray each nostril one time per day.   2. Continue to Treat and prevent reflux:   A. Continue to eliminate all caffeine consumption  B. omeprazole 20 mg one tablet one time per day in morning  3. If needed:    A. OTC antihistamine - Claritin/Allegra/Zyrtec  B. Nasal ipratropium 0.06% 2 sprays each nostril every 6 hours to dry up nose.  4. Return to clinic in 6 months or earlier if problem  Sabrina English is doing very well on her current medical plan and she will continue to use therapy directed against respiratory tract inflammation and reflux-induced respiratory disease as noted above and I will see her back in this clinic in 6 months or earlier if there is a problem.  [name], MD All[facility] / Immunology [place]

## 2016-06-15 NOTE — Patient Instructions (Signed)
  1. Continue to Treat and prevent inflammation:   A. OTC Rhinocort one spray each nostril one time per day.   2. Continue to Treat and prevent reflux:   A. Continue to eliminate all caffeine consumption  B. omeprazole 20 mg one tablet one time per day in morning  3. If needed:    A. OTC antihistamine - Claritin/Allegra/Zyrtec  B. Nasal ipratropium 0.06% 2 sprays each nostril every 6 hours to dry up nose.  4. Return to clinic in 6 months or earlier if problem

## 2016-06-21 ENCOUNTER — Other Ambulatory Visit: Payer: Self-pay | Admitting: Allergy and Immunology

## 2016-07-01 DIAGNOSIS — C4360 Malignant melanoma of unspecified upper limb, including shoulder: Secondary | ICD-10-CM | POA: Insufficient documentation

## 2016-07-01 NOTE — Patient Instructions (Addendum)
It was a pleasure to see you today. Continue same medications and return in 6 months to follow-up on creatinine. Stay well hydrated.

## 2016-07-04 ENCOUNTER — Emergency Department (HOSPITAL_COMMUNITY): Payer: Medicare Other

## 2016-07-04 ENCOUNTER — Encounter (HOSPITAL_COMMUNITY): Payer: Self-pay | Admitting: Emergency Medicine

## 2016-07-04 ENCOUNTER — Telehealth: Payer: Self-pay | Admitting: Internal Medicine

## 2016-07-04 ENCOUNTER — Emergency Department (HOSPITAL_COMMUNITY)
Admission: EM | Admit: 2016-07-04 | Discharge: 2016-07-04 | Disposition: A | Discharge status: Home / Self Care | Payer: Medicare Other | Attending: Emergency Medicine | Admitting: Emergency Medicine

## 2016-07-04 DIAGNOSIS — E785 Hyperlipidemia, unspecified: Secondary | ICD-10-CM | POA: Diagnosis not present

## 2016-07-04 DIAGNOSIS — M5441 Lumbago with sciatica, right side: Secondary | ICD-10-CM

## 2016-07-04 DIAGNOSIS — M5489 Other dorsalgia: Secondary | ICD-10-CM | POA: Diagnosis not present

## 2016-07-04 DIAGNOSIS — R030 Elevated blood-pressure reading, without diagnosis of hypertension: Secondary | ICD-10-CM | POA: Diagnosis not present

## 2016-07-04 DIAGNOSIS — M545 Low back pain: Secondary | ICD-10-CM | POA: Diagnosis not present

## 2016-07-04 DIAGNOSIS — H8109 Meniere's disease, unspecified ear: Secondary | ICD-10-CM | POA: Insufficient documentation

## 2016-07-04 MED ORDER — CYCLOBENZAPRINE HCL 10 MG PO TABS
10.0000 mg | ORAL_TABLET | Freq: Once | ORAL | Status: AC
  Administered 2016-07-04: 10 mg via ORAL
  Filled 2016-07-04: qty 1

## 2016-07-04 MED ORDER — CYCLOBENZAPRINE HCL 5 MG PO TABS: 5.0000 mg | ORAL_TABLET | Freq: Three times a day (TID) | ORAL | 0 refills | Status: DC | PRN

## 2016-07-04 MED ORDER — OXYCODONE-ACETAMINOPHEN 5-325 MG PO TABS
1.0000 | ORAL_TABLET | Freq: Once | ORAL | Status: AC
  Administered 2016-07-04: 1 via ORAL
  Filled 2016-07-04: qty 1

## 2016-07-04 MED ORDER — OXYCODONE-ACETAMINOPHEN 5-325 MG PO TABS: 1.0000 | ORAL_TABLET | ORAL | 0 refills | Status: DC | PRN

## 2016-07-04 MED ORDER — PREDNISONE 10 MG PO TABS: ORAL_TABLET | ORAL | 0 refills | Status: DC

## 2016-07-04 NOTE — Telephone Encounter (Signed)
Seen in ED with back pain since Thursday. Given Flexeril and Pain med. Call in Prednisone. Back Xray results reviewed. See tomorrow.

## 2016-07-04 NOTE — ED Provider Notes (Signed)
[facility] DEPT Provider Note   CSN: [id number] Arrival date & time: 07/04/16  2440  By signing my name below, I, Sabrina English, attest that this documentation has been prepared under the direction and in the presence of Sabrina Fuel, MD. Electronically Signed: Mayer English, Scribe. 07/04/16. 3:47 AM.   History   Chief Complaint Chief Complaint  Patient presents with  . Back Pain   The history is provided by the patient. No language interpreter was used.    HPI Comments: Sabrina English is a 83 y.o. female with PMHx of degenerative disc disorder who presents to the Emergency Department complaining of constant, gradually improving lower back pain that began since Thursday. She has associated swelling to the area. She states the pain radiates down to her right leg. She states the pain was 10/10 in severity when she called EMS. She has tried applying ice and taking aleve with some mild. She denies bowel/bladder incontinence. She denies any mechanism of injury. Pt reports a similar issue of back pain 2 years ago but these resolved on its own. Dr. Renold Genta is her PCP. Pt states she had an X-ray of her lower back 10 years ago in her PCP's office. Past Medical History:  Diagnosis Date  . Cancer (HCC)    melanoma l shoulder  . GERD (gastroesophageal reflux disease)   . Hyperlipidemia   . Meniere's disease   . Osteoporosis    osteopenia    Patient Active Problem List   Diagnosis Date Noted  . Malignant melanoma of upper extremity, including shoulder ([place]) 07/01/2016  . Pulmonary nodules 06/05/2014  . Macular degeneration of both eyes 08/16/2011  . Meniere disease 05/27/2010  . Hyperlipidemia 05/27/2010  . GE reflux 05/27/2010  . Osteopenia 05/27/2010    Past Surgical History:  Procedure Laterality Date  . AXILLARY NODE DISSECTION  1983  . BUNIONECTOMY    . CATARACT Bilateral   . MELANOMA EXCISION     left shoulder    OB History    No data available       Home  Medications    Prior to Admission medications   Medication Sig Start Date End Date Taking? Authorizing Provider  ALPRAZolam (XANAX) 0.25 MG tablet Take 1 tablet (0.25 mg total) by mouth at bedtime as needed. for sleep 01/22/16   Elby Showers, MD  aspirin 81 MG EC tablet Take 81 mg by mouth daily.      [provider]  Betahistine HCl (BETAHISTINE DIHYDROCHLORIDE) POWD 8 mg. 01/12/16   [provider]  budesonide (RHINOCORT ALLERGY) 32 MCG/ACT nasal spray Place 1 spray into both nostrils daily.    [provider]  calcium-vitamin D (OSCAL WITH D) 500-200 MG-UNIT per tablet Take 1 tablet by mouth daily.      [provider]  cholecalciferol (VITAMIN D) 1000 UNITS tablet Take 1,000 Units by mouth daily.    [provider]  dicyclomine (BENTYL) 20 MG tablet Take 1 tablet (20 mg total) by mouth every 6 (six) hours. 08/21/12   Elby Showers, MD  ipratropium (ATROVENT) 0.06 % nasal spray Use two sprays in each nostril every 6 hours if needed to dry up nose 12/23/15   Kozlow, Donnamarie Poag, MD  meclizine (ANTIVERT) 25 MG tablet Take 1 tablet (25 mg total) by mouth 3 (three) times daily as needed. 09/11/15   Elby Showers, MD  Multiple Vitamins-Minerals (PRESERVISION AREDS 2 PO) Take by mouth 2 (two) times daily.    [provider]  omeprazole (PRILOSEC) 20 MG capsule TAKE ONE CAPSULE EVERY MORNING BEFORE BREAKFAST 06/21/16   Kozlow, Donnamarie Poag, MD  simvastatin (ZOCOR) 20 MG tablet TAKE 1 TABLET BY MOUTH DAILY AT 6 PM. 02/26/16   Elby Showers, MD  triamterene-hydrochlorothiazide (DYAZIDE) 37.5-25 MG capsule TAKE ONE CAPSULE EVERY DAY 02/24/16   Elby Showers, MD  Vitamins-Lipotropics (LIPOFLAVONOID PO) Take by mouth.    [provider]    Family History Family History  Problem Relation Age of Onset  . Mental illness Mother   . Hyperlipidemia Mother   . Cancer Father   . Asthma Father   . Cancer Sister     Social History Social History    Substance Use Topics  . Smoking status: Never Smoker  . Smokeless tobacco: Never Used  . Alcohol use No     Allergies   Patient has no known allergies.   Review of Systems Review of Systems  Musculoskeletal: Positive for back pain and myalgias.  All other systems reviewed and are negative.    Physical Exam Updated Vital Signs BP (!) 144/72 (BP Location: Right Arm)   Pulse 64   Temp 97.8 F (36.6 C) (Oral)   Resp 16   Wt 154 lb (69.9 kg)   SpO2 98%   BMI 28.17 kg/m   Physical Exam  Constitutional: She is oriented to person, place, and time. She appears well-developed and well-nourished.  HENT:  Head: Normocephalic and atraumatic.  Eyes: EOM are normal. Pupils are equal, round, and reactive to light.  Neck: Normal range of motion. Neck supple. No JVD present.  Cardiovascular: Normal rate, regular rhythm and normal heart sounds.   No murmur heard. Pulmonary/Chest: Effort normal and breath sounds normal. She has no wheezes. She has no rales. She exhibits no tenderness.  Abdominal: Soft. Bowel sounds are normal. She exhibits no distension and no mass. There is no tenderness.  Musculoskeletal: Normal range of motion. She exhibits edema and tenderness.  Moderate tenderness mid and lower lumbar spine 1+ pretibial and 1+presacral edema Straight leg raise positive bilateral at 30 degrees  Lymphadenopathy:    She has no cervical adenopathy.  Neurological: She is alert and oriented to person, place, and time. No cranial nerve deficit. She exhibits normal muscle tone. Coordination normal.  Skin: Skin is warm and dry. No rash noted.  Psychiatric: She has a normal mood and affect. Her behavior is normal. Judgment and thought content normal.  Nursing note and vitals reviewed.    ED Treatments / Results  DIAGNOSTIC STUDIES: Oxygen Saturation is 98% on RA, normal by my interpretation.    COORDINATION OF CARE: 3:43 AM Discussed treatment plan with pt at bedside and pt agreed  to plan.  Radiology Dg Lumbar Spine Complete  Result Date: 07/04/2016 CLINICAL DATA:  Acute onset of lower back pain, radiating to the right leg. Initial encounter. EXAM: LUMBAR SPINE - COMPLETE 4+ VIEW COMPARISON:  None. FINDINGS: There is no evidence of fracture or subluxation. Vertebral bodies demonstrate normal height. There is grade 1 retrolisthesis of L2 on L3 and of L3 on L4. Multilevel vacuum phenomenon is noted along the lower thoracic and lumbar spine, with multilevel disc space narrowing along the lumbar spine and mild sclerosis at the lower lumbar spine. Underlying facet disease is noted at the lower lumbar spine. The visualized bowel gas pattern is unremarkable in appearance; air and stool are noted within the colon. The sacroiliac joints are within normal limits. IMPRESSION: 1. No evidence of  fracture or subluxation along the lumbar spine. 2. Mild diffuse degenerative change along the lower thoracic and lumbar spine. Electronically Signed   By: Garald Balding M.D.   On: 07/04/2016 04:50    Procedures Procedures (including critical care time)  Medications Ordered in ED Medications  cyclobenzaprine (FLEXERIL) tablet 10 mg (not administered)  oxyCODONE-acetaminophen (PERCOCET/ROXICET) 5-325 MG per tablet 1 tablet (not administered)     Initial Impression / Assessment and Plan / ED Course  I have reviewed the triage vital signs and the nursing notes.  Pertinent imaging results that were available during my care of the patient were reviewed by me and considered in my medical decision making (see chart for details).  Low back pain with radiation of the right leg consistent with sciatica. No evidence of neurologic injury. No red flags to suggest more serious illness. Review of old records does show that she had a CT of abdomen and pelvis in 2004 showing no evidence of aneurysm and degenerative changes present. Plain x-rays are obtained today showing degenerative changes. She states that  her pain is better, but she does have difficulty getting comfortable. She is discharged with prescriptions for cyclobenzaprine and oxycodone-acetaminophen. She is to continue using naproxen. Return precautions discussed. Follow-up with PCP later this week.  Final Clinical Impressions(s) / ED Diagnoses   Final diagnoses:  Acute midline low back pain with right-sided sciatica    New Prescriptions New Prescriptions   CYCLOBENZAPRINE (FLEXERIL) 5 MG TABLET    Take 1 tablet (5 mg total) by mouth 3 (three) times daily as needed for muscle spasms.   OXYCODONE-ACETAMINOPHEN (PERCOCET) 5-325 MG TABLET    Take 1 tablet by mouth every 4 (four) hours as needed for moderate pain.   I personally performed the services described in this documentation, which was scribed in my presence. The recorded information has been reviewed and is accurate.       Sabrina Fuel, MD 38/18/29 [id number]

## 2016-07-04 NOTE — Discharge Instructions (Signed)
Return if you are having any problems. 

## 2016-07-04 NOTE — ED Notes (Signed)
Patient transported to X-ray 

## 2016-07-04 NOTE — ED Triage Notes (Signed)
Pt transported from home by EMS with c/o lower back pain radiating to R leg onset 4 days ago, unrelieved by OTC meds. Worse with ambulation, denies injury or fall,

## 2016-07-05 ENCOUNTER — Encounter: Payer: Self-pay | Admitting: Internal Medicine

## 2016-07-05 ENCOUNTER — Ambulatory Visit (INDEPENDENT_AMBULATORY_CARE_PROVIDER_SITE_OTHER): Payer: Medicare Other | Admitting: Internal Medicine

## 2016-07-05 VITALS — BP 142/68 | HR 62 | Temp 98.5°F | Wt 159.0 lb

## 2016-07-05 DIAGNOSIS — M5431 Sciatica, right side: Secondary | ICD-10-CM | POA: Diagnosis not present

## 2016-07-13 IMAGING — CR DG CHEST 2V
2 series · 2 of 2 positions shown · non-contrast
Comparison: May 10, 2014.

CLINICAL DATA: Pneumonia.

EXAM:
CHEST  2 VIEW

[PA]
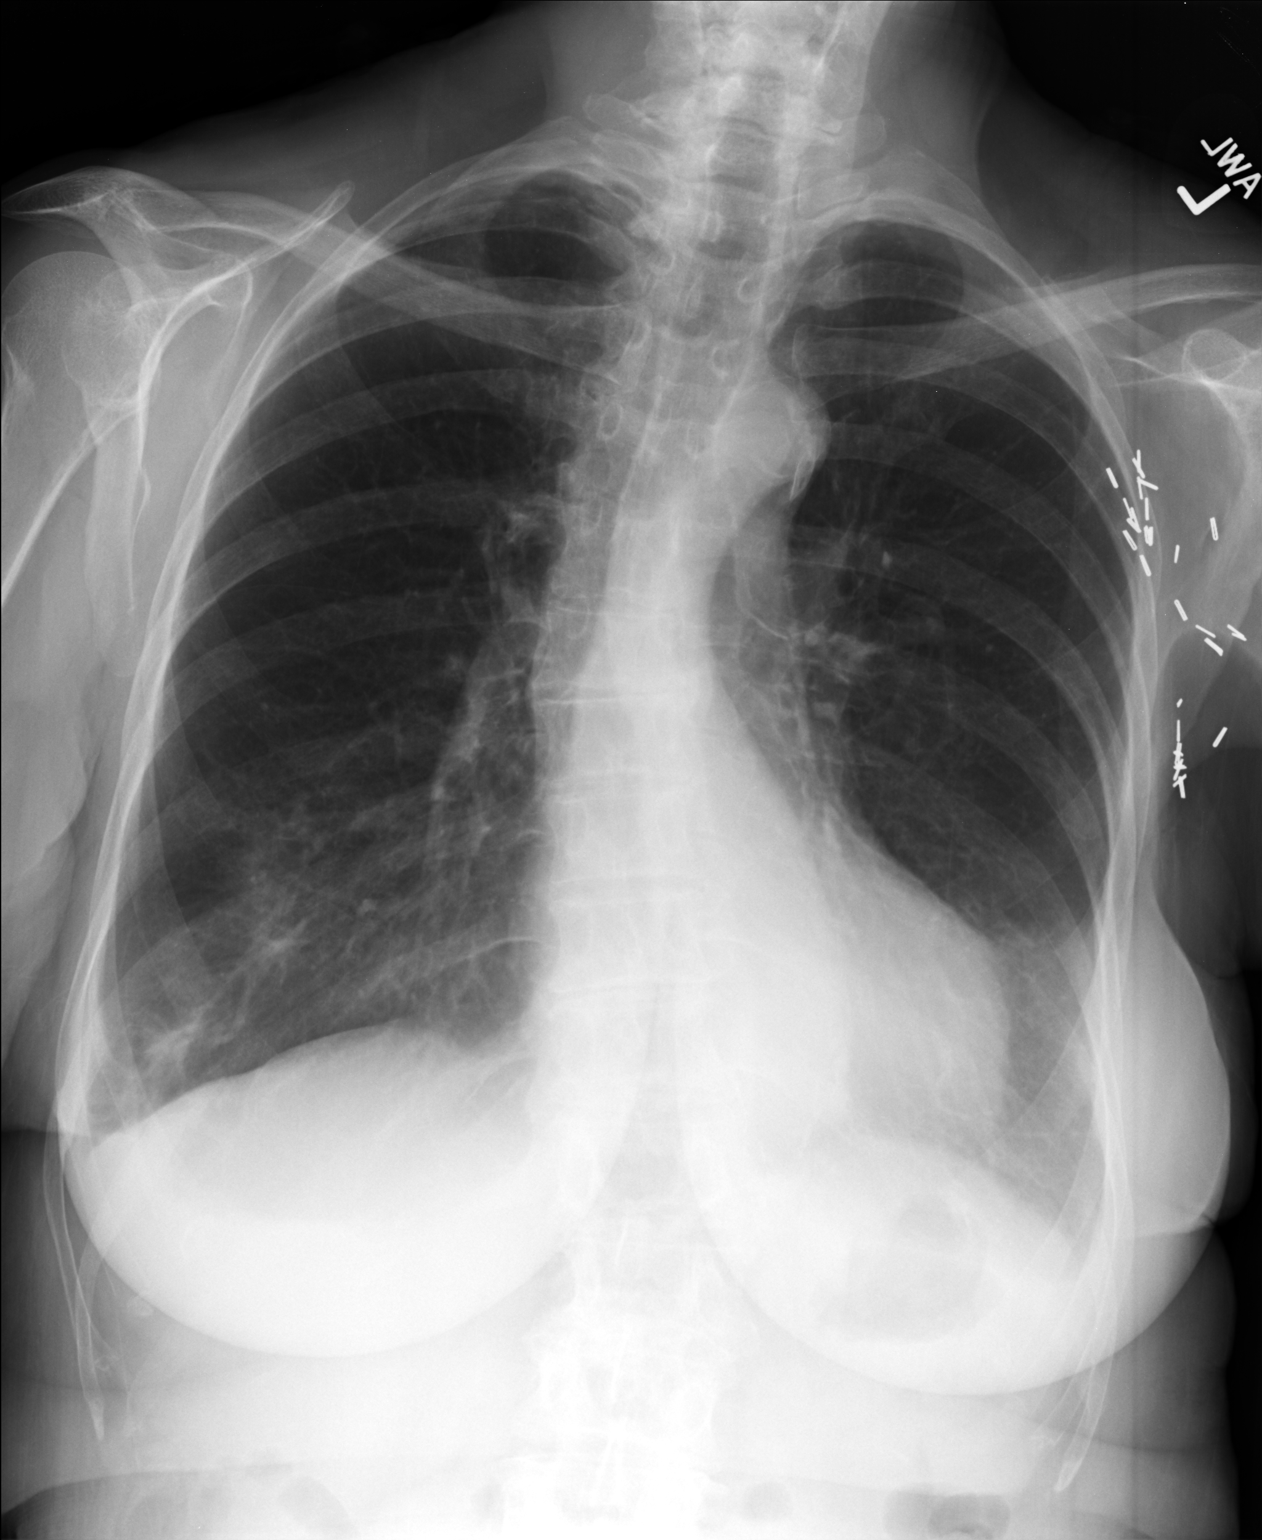

[lateral]
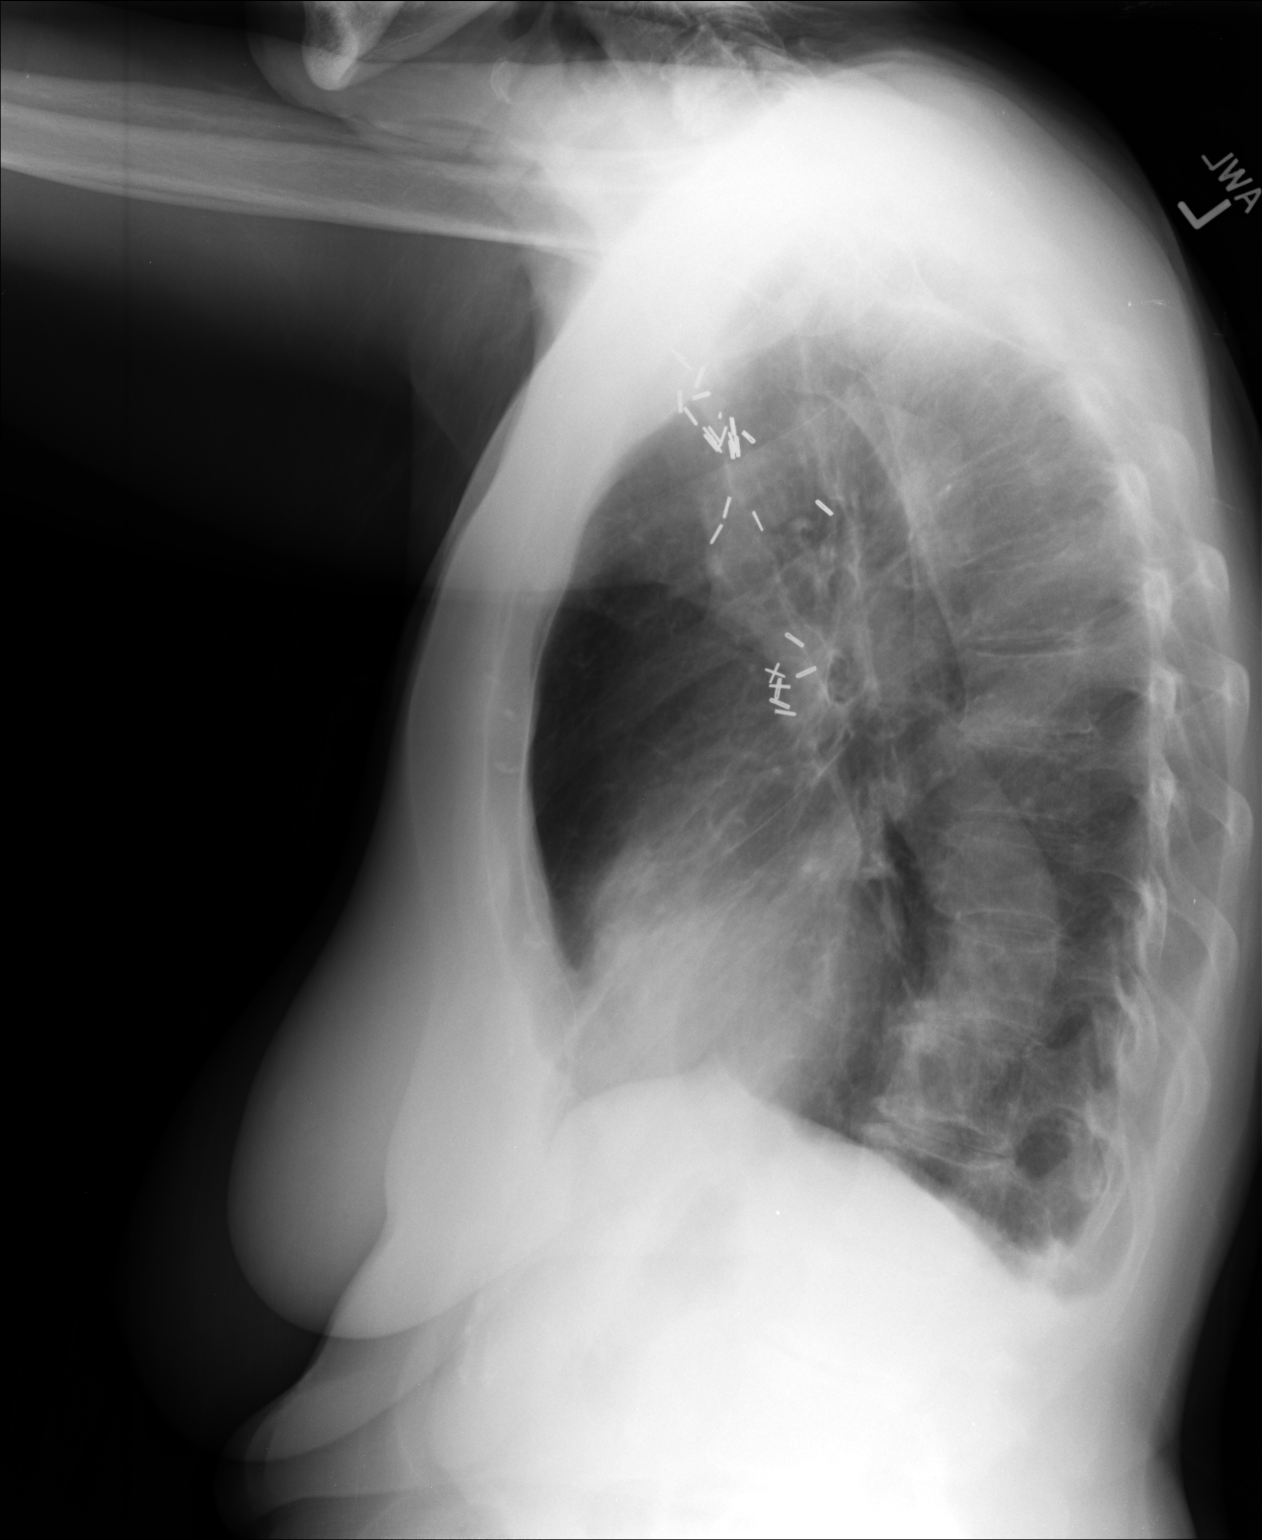

[2 of 2 positions shown; findings below may reference images not displayed]

FINDINGS: Stable cardiomediastinal silhouette. No pneumothorax is noted.
Surgical clips are noted in left axillary region. Minimal bilateral
pleural effusions are noted. Left lung is clear. Stable right
basilar density is noted concerning for possible pneumonia and/or
scarring. Mild dextroscoliosis of thoracic spine is noted.
Hyperexpansion of the lungs is noted suggesting chronic obstructive
pulmonary disease.
IMPRESSION: Stable right basilar density is noted concerning for pneumonia
and/or scarring. Continued radiographic follow-up is recommended to
ensure resolution and rule out underlying mass or neoplasm.

## 2016-07-18 ENCOUNTER — Telehealth: Payer: Self-pay

## 2016-07-18 NOTE — Telephone Encounter (Signed)
Pt called and stated that when she lays down she is still having pain. Alternating ibuprofen and aleve. Said her right leg is numb and feels like it may give way. She would like to know what she should do: continue what she is doing and give it more time or come back in to see Dr. Renold Genta. Please advise.

## 2016-07-18 NOTE — Telephone Encounter (Signed)
Pt is aware and appt made  

## 2016-07-18 NOTE — Telephone Encounter (Signed)
She was much better at last visit so this is a surprise. She will need OV later this week.

## 2016-07-21 ENCOUNTER — Ambulatory Visit (INDEPENDENT_AMBULATORY_CARE_PROVIDER_SITE_OTHER): Payer: Medicare Other | Admitting: Internal Medicine

## 2016-07-21 ENCOUNTER — Encounter: Payer: Self-pay | Admitting: Internal Medicine

## 2016-07-21 VITALS — BP 120/64 | HR 69 | Temp 98.8°F | Wt 160.0 lb

## 2016-07-21 DIAGNOSIS — M5431 Sciatica, right side: Secondary | ICD-10-CM

## 2016-07-21 MED ORDER — TRAMADOL HCL 50 MG PO TABS: 50.0000 mg | ORAL_TABLET | Freq: Two times a day (BID) | ORAL | 0 refills | Status: DC | PRN

## 2016-07-21 NOTE — Patient Instructions (Addendum)
May take tramadol 1 by mouth every 12 hours when necessary back pain. Take Aleve 1 by mouth twice daily. Physical therapy declined by patient at this point in time.

## 2016-07-21 NOTE — Progress Notes (Signed)
   Subjective:    Patient ID: Sabrina English, female    DOB: Mar 11, 1933, 83 y.o.   MRN: [id number]  HPI 83 year old Female seen recently for an episode of right sciatica that had fairly acute onset. She was given a six-day prednisone tapering course and improved. However, she called on July 16 saying she was having recurrent symptoms. Apparently could not get comfortable in bed because of back pain. Has not had foot drop or significant weakness in the right lower extremity. She wants to get better because of friend is invited her to go to the beach next week. She does have hydrocodone/APAP on hand for pain but doesn't really want to take that. She has been taking some Aleve alternating with Advil sparingly.  She was seen in the emergency department July 2 and then I saw her on July 3 for follow-up. Apparently back pain had started around June 28. It was radiating down her right leg and causing some discomfort in her right ankle. There is no incontinence of bowel or bladder. Doesn't recall any mechanism of injury.  X-rays of the lumbar spine done on July 2 in the emergency department showed no evidence of compression fracture or subluxation of the lumbar spine.    Review of Systems     Objective:   Physical Exam Straight leg raising is negative on the right at 90 and her muscle strength in the right lower extremity is normal. There is no foot drop.       Assessment & Plan:  Right-sided sciatica  Plan: She prefers not to go back on prednisone at this point in time. I have given her tramadol 50 mg every 12 hours when necessary back pain. She may take Aleve 1 by mouth twice daily. I offered her physical therapy and she doesn't really want to do that at this point in time. She feels she is getting better over the last 24 hours she will continue to improve. She really wants to go the beach next week.

## 2016-07-30 NOTE — Patient Instructions (Signed)
May take Flexeril and Percocet if needed. Take prednisone tapering course as directed.

## 2016-07-30 NOTE — Progress Notes (Signed)
   Subjective:    Patient ID: Sabrina English, female    DOB: 12/21/1933, 83 y.o.   MRN: [id number]  HPI She was in the emergency department on July 2 with acute lower back pain radiating into right leg. She called EMS because the pain was so severe. She tried taking Aleve and ice with little relief. She denied bowel and bladder incontinence and did not know of any injury. No recent x-rays of her back.  She had acute left leg pain in 2016 and had been riding a stationary bike at the [facility] an hour 3 times a week. Was advised to take Aleve and use warm hot compresses on left medial thigh. Ultrasound revealed no evidence of DVT. X-ray was not done then. Emergency department physician prescribed Flexeril and Percocet. However apparently she did not get much sleep and still is having some issues with back pain today. Was diagnosed with right sciatica. CT of the abdomen and pelvis in 2004 showed degenerative disc disease L2-L3 and L5-S1.    Review of Systems see above     Objective:   Physical Exam She has some discomfort of right straight leg raising at 90 but not severe. Muscle strength is normal in both lower extremities and deep tendon reflexes in the knees 2+ and symmetrical       Assessment & Plan:  Right sciatica  Plan: May continue Percocet if needed for severe pain. Has Flexeril for muscle spasm. Prednisone 10 mg (#21) take in tapering course starting with 60 mg and decreasing by 10 mg daily over 6 days.

## 2016-09-08 DIAGNOSIS — H353124 Nonexudative age-related macular degeneration, left eye, advanced atrophic with subfoveal involvement: Secondary | ICD-10-CM | POA: Diagnosis not present

## 2016-09-08 DIAGNOSIS — H43813 Vitreous degeneration, bilateral: Secondary | ICD-10-CM | POA: Diagnosis not present

## 2016-10-13 ENCOUNTER — Ambulatory Visit (INDEPENDENT_AMBULATORY_CARE_PROVIDER_SITE_OTHER): Payer: Medicare Other | Admitting: Internal Medicine

## 2016-10-13 DIAGNOSIS — Z23 Encounter for immunization: Secondary | ICD-10-CM | POA: Diagnosis not present

## 2016-10-13 NOTE — Progress Notes (Signed)
Flu shot given

## 2016-10-13 NOTE — Patient Instructions (Signed)
Flu Shot Given

## 2016-11-14 DIAGNOSIS — M270 Developmental disorders of jaws: Secondary | ICD-10-CM | POA: Diagnosis not present

## 2016-11-14 DIAGNOSIS — J342 Deviated nasal septum: Secondary | ICD-10-CM | POA: Diagnosis not present

## 2016-11-14 DIAGNOSIS — H61303 Acquired stenosis of external ear canal, unspecified, bilateral: Secondary | ICD-10-CM | POA: Diagnosis not present

## 2016-11-14 DIAGNOSIS — H903 Sensorineural hearing loss, bilateral: Secondary | ICD-10-CM | POA: Diagnosis not present

## 2016-11-14 DIAGNOSIS — E785 Hyperlipidemia, unspecified: Secondary | ICD-10-CM | POA: Diagnosis not present

## 2016-11-18 ENCOUNTER — Ambulatory Visit (INDEPENDENT_AMBULATORY_CARE_PROVIDER_SITE_OTHER): Payer: Medicare Other | Admitting: Internal Medicine

## 2016-11-18 ENCOUNTER — Encounter: Payer: Self-pay | Admitting: Internal Medicine

## 2016-11-18 VITALS — BP 140/80 | HR 72 | Temp 98.2°F | Wt 154.0 lb

## 2016-11-18 DIAGNOSIS — R05 Cough: Secondary | ICD-10-CM

## 2016-11-18 DIAGNOSIS — J22 Unspecified acute lower respiratory infection: Secondary | ICD-10-CM | POA: Diagnosis not present

## 2016-11-18 DIAGNOSIS — R059 Cough, unspecified: Secondary | ICD-10-CM

## 2016-11-18 DIAGNOSIS — M6281 Muscle weakness (generalized): Secondary | ICD-10-CM | POA: Diagnosis not present

## 2016-11-18 MED ORDER — METHYLPREDNISOLONE ACETATE 80 MG/ML IJ SUSP
80.0000 mg | Freq: Once | INTRAMUSCULAR | Status: AC
  Administered 2016-11-18: 80 mg via INTRAMUSCULAR

## 2016-11-18 MED ORDER — LEVOFLOXACIN 500 MG PO TABS: 500.0000 mg | ORAL_TABLET | Freq: Every day | ORAL | 0 refills | Status: DC

## 2016-11-18 MED ORDER — HYDROCODONE-HOMATROPINE 5-1.5 MG/5ML PO SYRP: 5.0000 mL | ORAL_SOLUTION | Freq: Three times a day (TID) | ORAL | 0 refills | Status: DC | PRN

## 2016-11-18 NOTE — Progress Notes (Signed)
   Subjective:    Patient ID: Sabrina English, female    DOB: 04/28/1933, 83 y.o.   MRN: [id number]  HPI Patient has come down with URI symptoms.  Has cough and congestion but no wheezing.  No fever.  Cough has become productive.  Has malaise and fatigue.  Also tells me that she is having some difficulty getting up out of a chair.  It sounds like she has some quadriceps weakness.  We will arrange for her to get some physical therapy.  This is a new complaint.    Review of Systems see above     Objective:   Physical Exam Skin warm and dry.  Nodes none.  TMs are clear.  Pharynx is very slightly injected without exudate.  Neck is supple without adenopathy.  Chest clear to auscultation without rales or wheezing.  Muscle strength 4/5 in the lower extremities.       Assessment & Plan:  Acute lower respiratory infection  Bilateral quadriceps weakness  25 minutes spent with patient and arranging for physical therapy  Plan: To have physical therapy for gait strengthening and quadriceps strengthening.  Depo-Medrol 80 mg IM for congestion.  Levaquin 500 mg daily for 10 days.

## 2016-11-27 NOTE — Patient Instructions (Signed)
She had physical therapy for quadriceps weakness.  Depo-Medrol 80 mg IM given in office.  Levaquin 500 mg daily for 10 days.

## 2016-12-09 ENCOUNTER — Telehealth: Payer: Self-pay

## 2016-12-09 NOTE — Telephone Encounter (Signed)
-----   Message from Worthy Keeler sent at 12/09/2016 10:11 AM EST ----- Patient called a few moments ago.  I don't have the security to add to her allergy list.  Can you add Levaquin under allergies for her.  States that when she took it this last time; by the time she finished it, she hurt more in her legs than when she started.  So, it basically gives her muscle aches.   Thanks!  Can't wait to have you back "home" next week!!

## 2016-12-09 NOTE — Telephone Encounter (Signed)
Added to patient's allergy list.

## 2017-01-12 DIAGNOSIS — H43813 Vitreous degeneration, bilateral: Secondary | ICD-10-CM | POA: Diagnosis not present

## 2017-01-12 DIAGNOSIS — H353133 Nonexudative age-related macular degeneration, bilateral, advanced atrophic without subfoveal involvement: Secondary | ICD-10-CM | POA: Diagnosis not present

## 2017-02-02 ENCOUNTER — Ambulatory Visit: Payer: Medicare Other | Attending: Retina Specialist | Admitting: Occupational Therapy

## 2017-02-02 DIAGNOSIS — H53413 Scotoma involving central area, bilateral: Secondary | ICD-10-CM | POA: Insufficient documentation

## 2017-02-02 NOTE — Therapy (Signed)
[facility] [address] [place], [place], [address] Phone: [phone]   Fax:  [phone]  Occupational Therapy Evaluation  Patient Details  Name: Sabrina English MRN: [id number] Date of Birth: Apr 10, 1933 Referring Provider: Dr. Billie Ruddy   Encounter Date: 02/02/2017  OT End of Session - 02/02/17 1732    Visit Number  1    Number of Visits  1    Authorization Type  Medicare    OT Start Time  7253    OT Stop Time  1115    OT Time Calculation (min)  52 min    Activity Tolerance  Patient tolerated treatment well    Behavior During Therapy  [facility] for tasks assessed/performed       Past Medical History:  Diagnosis Date  . Cancer (HCC)    melanoma l shoulder  . GERD (gastroesophageal reflux disease)   . Hyperlipidemia   . Meniere's disease   . Osteoporosis    osteopenia    Past Surgical History:  Procedure Laterality Date  . AXILLARY NODE DISSECTION  1983  . BUNIONECTOMY    . CATARACT Bilateral   . MELANOMA EXCISION     left shoulder    There were no vitals filed for this visit.  Subjective Assessment - 02/02/17 1026    Subjective   Pt with macular degeneration that has worsened over the last year    Pertinent History  Meineure's disease, OA    Patient Stated Goals  tips for seeing better    Currently in Pain?  No/denies        [facility] OT Assessment - 02/02/17 1029      Assessment   Medical Diagnosis  macular degeneration    Referring Provider  Dr. Billie Ruddy    Onset Date/Surgical Date  01/12/17      Precautions   Precautions  Other (comment)    Precaution Comments  low vision       Balance Screen   Has the patient fallen in the past 6 months  No      Home  Environment   Family/patient expects to be discharged to:  Private residence    Home Access  Level entry    Home Layout  One level    Lives With  Alone      ADL   ADL comments  Pt is modified independent with all  basic ADLs      IADL   Shopping  Takes care of all shopping needs independently    [place] alone or with occasional assistance    Meal Prep  Able to complete simple warm meal prep    Medication Management  Is responsible for taking medication in correct dosages at correct time    Financial Management  -- uses direct draft,      Mobility   Mobility Status  Independent      Vision - History   Baseline Vision  Wears glasses only for reading    Visual History  Macular degeneration    Patient Visual Report  Central vision impairment      Vision Assessment   Vision Assessment  Vision tested    Visual Acuity  Per MD/OD report    Per MD/OD Report  OD 20/50+2, OS CF@4 ''    Reading Acuity  (0.6)    Comment  Pt reports difficulty reading and adjusting her dishwaher.      Cognition   Overall Cognitive Status  Within Functional Limits for tasks assessed    Mini Mental State Exam   28/30                       OT Education - 02/02/17 1723    Education provided  Yes    Education Details  eccentric viewing, use of pebble mini video magnifier, 3x stand magnifier, 4x handheld magnifier, use of hi marks and check line guide, full spectrum lighting    Person(s) Educated  Patient    Methods  Explanation;Demonstration;Verbal cues;Handout    Comprehension  Verbalized understanding;Returned demonstration Pt returned demonstration or verbalized understanding of all education.   Pt was given information for purchasing AE.                Plan - 02/02/17 1518    Clinical Impression Statement  Pt is an 84 y.o female with macular degeneration who demonstrates visual impairments which impede performance of ADLS/ IADLS. Pt can benefit from skilled OT for education regarding AE and adapted strategies to maximize safety and independence with ADLs/IADLS.    Occupational Profile and client history currently impacting functional performance  Pt lives alone in  a townhouse. Her dtr lives nearby. Pt is still driving during the daytime.    Occupational performance deficits (Please refer to evaluation for details):  ADL's;IADL's;Rest and Sleep;Leisure    Rehab Potential  Good    Current Impairments/barriers affecting progress:  lenght of time since onset    OT Frequency  One time visit    OT Duration  8 weeks    OT Treatment/Interventions  Self-care/ADL training;Patient/family education    Plan  Pt was provided with education regarding eccentric viewing, AE, and hi marks on day of eval. Pt demonstrates good understanding of all education, and therefore no goals were set at this time. No additional visits needed    Clinical Decision Making  Limited treatment options, no task modification necessary    Consulted and Agree with Plan of Care  Patient       Patient will benefit from skilled therapeutic intervention in order to improve the following deficits and impairments:  Impaired vision/preception  Visit Diagnosis: Scotoma involving central area, bilateral    Problem List Patient Active Problem List   Diagnosis Date Noted  . Malignant melanoma of upper extremity, including shoulder ([place]) 07/01/2016  . Pulmonary nodules 06/05/2014  . Macular degeneration of both eyes 08/16/2011  . Meniere disease 05/27/2010  . Hyperlipidemia 05/27/2010  . GE reflux 05/27/2010  . Osteopenia 05/27/2010    Deseray Daponte 02/02/2017, 5:33 PM Theone Murdoch, OTR/L [phone] Phone: [phone] 5:34 PM 02/02/17 [facility] [address] [place], [place], [address] Phone: [phone]   Fax:  [phone]  Name: Sabrina English MRN: [id number] Date of Birth: [birth year]

## 2017-02-08 ENCOUNTER — Ambulatory Visit: Payer: Medicare Other | Admitting: Occupational Therapy

## 2017-02-15 ENCOUNTER — Encounter: Payer: Self-pay | Admitting: Allergy and Immunology

## 2017-02-15 ENCOUNTER — Ambulatory Visit (INDEPENDENT_AMBULATORY_CARE_PROVIDER_SITE_OTHER): Payer: Medicare Other | Admitting: Allergy and Immunology

## 2017-02-15 VITALS — BP 152/80 | HR 68 | Resp 20

## 2017-02-15 DIAGNOSIS — J310 Chronic rhinitis: Secondary | ICD-10-CM | POA: Diagnosis not present

## 2017-02-15 DIAGNOSIS — K219 Gastro-esophageal reflux disease without esophagitis: Secondary | ICD-10-CM

## 2017-02-15 DIAGNOSIS — J3089 Other allergic rhinitis: Secondary | ICD-10-CM

## 2017-02-15 MED ORDER — IPRATROPIUM BROMIDE 0.06 % NA SOLN: NASAL | 11 refills | Status: DC

## 2017-02-15 NOTE — Patient Instructions (Addendum)
  1. Continue to Treat and prevent inflammation:   A. OTC Rhinocort one spray each nostril one time per day.   2. Continue to Treat and prevent reflux:   A. Continue to eliminate all caffeine consumption  B. omeprazole 20 mg one tablet one time per day in morning  3. If needed:    A. OTC antihistamine - Claritin/Allegra/Zyrtec  B. Nasal ipratropium 0.06% 2 sprays each nostril every 6 hours to dry up nose.  4. Return to clinic in 12 months or earlier if problem

## 2017-02-15 NOTE — Progress Notes (Signed)
Follow-up Note  Referring Provider: Elby Showers, MD Primary Provider: Elby Showers, MD Date of Office Visit: 02/15/2017  Subjective:   Sabrina English (DOB: 1933/11/17) is a 84 y.o. female who returns to the [facility] on 02/15/2017 in re-evaluation of the following:  HPI: Sabrina English presents to this clinic in evaluation of LPR, allergic rhinitis, and gustatory rhinitis.  Her last visit to this clinic was 15 June 2016.  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  Rarely does she have any problems with postnasal drip or throat clearing or coughing or issues with her nose.  She still has the issue with gustatory rhinitis.  Every time she goes out to eat she will develop a drippy nose but this is completely addressed with the use of nasal ipratropium which works well to prevent this issue.  Allergies as of 02/15/2017      Reactions   Levaquin [levofloxacin In D5w] Other (See Comments)   Lower extremity pain       Medication List      ALPRAZolam 0.25 MG tablet Commonly known as:  XANAX Take 1 tablet (0.25 mg total) by mouth at bedtime as needed. for sleep   aspirin 81 MG EC tablet Take 81 mg by mouth daily.   calcium-vitamin D 500-200 MG-UNIT tablet Commonly known as:  OSCAL WITH D Take 1 tablet by mouth daily.   [employer] LEG CRAMPS PAIN RELIEF PO Take 3 tablets by mouth.   cyclobenzaprine 5 MG tablet Commonly known as:  FLEXERIL Take 1 tablet (5 mg total) by mouth 3 (three) times daily as needed for muscle spasms.   dicyclomine 20 MG tablet Commonly known as:  BENTYL Take 1 tablet (20 mg total) by mouth every 6 (six) hours.   HYDROcodone-homatropine 5-1.5 MG/5ML syrup Commonly known as:  HYCODAN Take 5 mLs every 8 (eight) hours as needed by mouth for cough.   ipratropium 0.06 % nasal spray Commonly known as:  ATROVENT Use two sprays in each nostril every 6 hours if needed to dry up nose   meclizine 25 MG  tablet Commonly known as:  ANTIVERT Take 1 tablet (25 mg total) by mouth 3 (three) times daily as needed.   naproxen sodium 220 MG tablet Commonly known as:  ALEVE Take 220 mg by mouth 2 (two) times daily as needed (for pain).   omeprazole 20 MG capsule Commonly known as:  PRILOSEC TAKE ONE CAPSULE EVERY MORNING BEFORE BREAKFAST   PRESERVISION AREDS 2 PO Take by mouth 2 (two) times daily.   RHINOCORT ALLERGY 32 MCG/ACT nasal spray Generic drug:  budesonide Place 1 spray into both nostrils daily.   simvastatin 20 MG tablet Commonly known as:  ZOCOR TAKE 1 TABLET BY MOUTH DAILY AT 6 PM.   traMADol 50 MG tablet Commonly known as:  ULTRAM Take 1 tablet (50 mg total) by mouth every 12 (twelve) hours as needed.   triamterene-hydrochlorothiazide 37.5-25 MG capsule Commonly known as:  DYAZIDE TAKE ONE CAPSULE EVERY DAY       Past Medical History:  Diagnosis Date  . Cancer (HCC)    melanoma l shoulder  . GERD (gastroesophageal reflux disease)   . Hyperlipidemia   . Macular degeneration   . Meniere's disease   . Osteoporosis    osteopenia    Past Surgical History:  Procedure Laterality Date  . AXILLARY NODE DISSECTION  1983  . BUNIONECTOMY    . CATARACT  Bilateral   . MELANOMA EXCISION     left shoulder    Review of systems negative except as noted in HPI / PMHx or noted below:  Review of Systems  Constitutional: Negative.   HENT: Negative.   Eyes: Negative.   Respiratory: Negative.   Cardiovascular: Negative.   Gastrointestinal: Negative.   Genitourinary: Negative.   Musculoskeletal: Negative.   Skin: Negative.   Neurological: Negative.   Endo/Heme/Allergies: Negative.   Psychiatric/Behavioral: Negative.      Objective:   Vitals:   02/15/17 1047  BP: (!) 152/80  Pulse: 68  Resp: 20          Physical Exam  Constitutional: She is well-developed, well-nourished, and in no distress.  HENT:  Head: Normocephalic.  Right Ear: External ear normal.   Left Ear: External ear normal.  Nose: Nose normal. No mucosal edema or rhinorrhea.  Mouth/Throat: Uvula is midline, oropharynx is clear and moist and mucous membranes are normal. No oropharyngeal exudate.  Eyes: Conjunctivae are normal.  Neck: Trachea normal. No tracheal tenderness present. No tracheal deviation present. No thyromegaly present.  Cardiovascular: Normal rate, regular rhythm, S1 normal, S2 normal and normal heart sounds.  No murmur heard. Pulmonary/Chest: Breath sounds normal. No stridor. No respiratory distress. She has no wheezes. She has no rales.  Musculoskeletal: She exhibits no edema.  Lymphadenopathy:       Head (right side): No tonsillar adenopathy present.       Head (left side): No tonsillar adenopathy present.    She has no cervical adenopathy.  Neurological: She is alert. Gait normal.  Skin: No rash noted. She is not diaphoretic. No erythema. Nails show no clubbing.  Psychiatric: Mood and affect normal.    Diagnostics: none   Assessment and Plan:   1. LPRD (laryngopharyngeal reflux disease)   2. Other allergic rhinitis   3. Gustatory rhinitis     1. Continue to Treat and prevent inflammation:   A. OTC Rhinocort one spray each nostril one time per day.   2. Continue to Treat and prevent reflux:   A. Continue to eliminate all caffeine consumption  B. omeprazole 20 mg one tablet one time per day in morning  3. If needed:    A. OTC antihistamine - Claritin/Allegra/Zyrtec  B. Nasal ipratropium 0.06% 2 sprays each nostril every 6 hours to dry up nose.  4. Return to clinic in 12 months or earlier if problem   Sabrina English has really done well on her plan which includes anti-inflammatory agents for her airway and therapy directed against reflux and a nasal anticholinergic agent.  I will see her back in this clinic in 1 year or earlier if there is any problem.  [name], MD Allergy / Immunology [place]

## 2017-02-16 ENCOUNTER — Encounter: Payer: Self-pay | Admitting: Allergy and Immunology

## 2017-02-22 ENCOUNTER — Other Ambulatory Visit: Payer: Self-pay | Admitting: Internal Medicine

## 2017-05-15 DIAGNOSIS — H6123 Impacted cerumen, bilateral: Secondary | ICD-10-CM | POA: Diagnosis not present

## 2017-05-15 DIAGNOSIS — M270 Developmental disorders of jaws: Secondary | ICD-10-CM | POA: Diagnosis not present

## 2017-05-15 DIAGNOSIS — H61303 Acquired stenosis of external ear canal, unspecified, bilateral: Secondary | ICD-10-CM | POA: Diagnosis not present

## 2017-05-15 DIAGNOSIS — J342 Deviated nasal septum: Secondary | ICD-10-CM | POA: Diagnosis not present

## 2017-05-15 DIAGNOSIS — H903 Sensorineural hearing loss, bilateral: Secondary | ICD-10-CM | POA: Diagnosis not present

## 2017-05-25 ENCOUNTER — Other Ambulatory Visit: Payer: Self-pay | Admitting: Allergy and Immunology

## 2017-05-25 DIAGNOSIS — H43813 Vitreous degeneration, bilateral: Secondary | ICD-10-CM | POA: Diagnosis not present

## 2017-05-25 DIAGNOSIS — H353132 Nonexudative age-related macular degeneration, bilateral, intermediate dry stage: Secondary | ICD-10-CM | POA: Diagnosis not present

## 2017-06-08 ENCOUNTER — Other Ambulatory Visit: Payer: Medicare Other | Admitting: Internal Medicine

## 2017-06-08 DIAGNOSIS — G4700 Insomnia, unspecified: Secondary | ICD-10-CM

## 2017-06-08 DIAGNOSIS — C4360 Malignant melanoma of unspecified upper limb, including shoulder: Secondary | ICD-10-CM

## 2017-06-08 DIAGNOSIS — M8580 Other specified disorders of bone density and structure, unspecified site: Secondary | ICD-10-CM

## 2017-06-08 DIAGNOSIS — H8109 Meniere's disease, unspecified ear: Secondary | ICD-10-CM | POA: Diagnosis not present

## 2017-06-08 DIAGNOSIS — H3530 Unspecified macular degeneration: Secondary | ICD-10-CM

## 2017-06-08 DIAGNOSIS — Z0000 Encounter for general adult medical examination without abnormal findings: Secondary | ICD-10-CM

## 2017-06-08 LAB — COMPLETE METABOLIC PANEL WITH GFR
AG RATIO: 2 (calc) (ref 1.0–2.5)
ALT: 16 U/L (ref 6–29)
AST: 23 U/L (ref 10–35)
Albumin: 4.1 g/dL (ref 3.6–5.1)
Alkaline phosphatase (APISO): 76 U/L (ref 33–130)
BUN/Creatinine Ratio: 19 (calc) (ref 6–22)
BUN: 25 mg/dL (ref 7–25)
CALCIUM: 9.6 mg/dL (ref 8.6–10.4)
CHLORIDE: 106 mmol/L (ref 98–110)
CO2: 28 mmol/L (ref 20–32)
Creat: 1.29 mg/dL — ABNORMAL HIGH (ref 0.60–0.88)
GFR, EST AFRICAN AMERICAN: 45 mL/min/{1.73_m2} — AB (ref >=60)
GFR, EST NON AFRICAN AMERICAN: 39 mL/min/{1.73_m2} — AB (ref >=60)
GLUCOSE: 92 mg/dL (ref 65–99)
Globulin: 2.1 g/dL (calc) (ref 1.9–3.7)
POTASSIUM: 3.8 mmol/L (ref 3.5–5.3)
Sodium: 142 mmol/L (ref 135–146)
TOTAL PROTEIN: 6.2 g/dL (ref 6.1–8.1)
Total Bilirubin: 0.7 mg/dL (ref 0.2–1.2)

## 2017-06-08 LAB — CBC WITH DIFFERENTIAL/PLATELET
BASOS ABS: 21 {cells}/uL (ref 0–200)
BASOS PCT: 0.6 %
EOS ABS: 112 {cells}/uL (ref 15–500)
EOS PCT: 3.2 %
HEMATOCRIT: 36.5 % (ref 35.0–45.0)
HEMOGLOBIN: 12.5 g/dL (ref 11.7–15.5)
LYMPHS ABS: 1229 {cells}/uL (ref 850–3900)
MCH: 28.9 pg (ref 27.0–33.0)
MCHC: 34.2 g/dL (ref 32.0–36.0)
MCV: 84.5 fL (ref 80.0–100.0)
MONOS PCT: 10.7 %
MPV: 9.8 fL (ref 7.5–12.5)
NEUTROS ABS: 1764 {cells}/uL (ref 1500–7800)
Neutrophils Relative %: 50.4 %
Platelets: 252 10*3/uL (ref 140–400)
RBC: 4.32 10*6/uL (ref 3.80–5.10)
RDW: 12.2 % (ref 11.0–15.0)
Total Lymphocyte: 35.1 %
WBC mixed population: 375 cells/uL (ref 200–950)
WBC: 3.5 10*3/uL — ABNORMAL LOW (ref 3.8–10.8)

## 2017-06-08 LAB — LIPID PANEL
CHOL/HDL RATIO: 2.2 (calc) (ref <5.0)
Cholesterol: 193 mg/dL (ref <200)
HDL: 87 mg/dL (ref >50)
LDL Cholesterol (Calc): 87 mg/dL (calc)
NON-HDL CHOLESTEROL (CALC): 106 mg/dL (ref <130)
Triglycerides: 91 mg/dL (ref <150)

## 2017-06-08 LAB — TSH: TSH: 0.33 mIU/L — ABNORMAL LOW (ref 0.40–4.50)

## 2017-06-12 ENCOUNTER — Ambulatory Visit (INDEPENDENT_AMBULATORY_CARE_PROVIDER_SITE_OTHER): Payer: Medicare Other | Admitting: Internal Medicine

## 2017-06-12 ENCOUNTER — Encounter: Payer: Self-pay | Admitting: Internal Medicine

## 2017-06-12 VITALS — BP 140/80 | HR 68 | Temp 98.1°F | Ht 62.5 in | Wt 151.0 lb

## 2017-06-12 DIAGNOSIS — E7849 Other hyperlipidemia: Secondary | ICD-10-CM | POA: Diagnosis not present

## 2017-06-12 DIAGNOSIS — H903 Sensorineural hearing loss, bilateral: Secondary | ICD-10-CM

## 2017-06-12 DIAGNOSIS — H8109 Meniere's disease, unspecified ear: Secondary | ICD-10-CM | POA: Diagnosis not present

## 2017-06-12 DIAGNOSIS — G4700 Insomnia, unspecified: Secondary | ICD-10-CM | POA: Diagnosis not present

## 2017-06-12 DIAGNOSIS — J301 Allergic rhinitis due to pollen: Secondary | ICD-10-CM

## 2017-06-12 DIAGNOSIS — M8580 Other specified disorders of bone density and structure, unspecified site: Secondary | ICD-10-CM | POA: Diagnosis not present

## 2017-06-12 DIAGNOSIS — Z0000 Encounter for general adult medical examination without abnormal findings: Secondary | ICD-10-CM

## 2017-06-12 DIAGNOSIS — H3530 Unspecified macular degeneration: Secondary | ICD-10-CM

## 2017-06-12 LAB — POCT URINALYSIS DIPSTICK
Appearance: NORMAL
BILIRUBIN UA: NEGATIVE
Blood, UA: NEGATIVE
Glucose, UA: NEGATIVE
KETONES UA: NEGATIVE
Leukocytes, UA: NEGATIVE
Nitrite, UA: NEGATIVE
ODOR: NORMAL
Protein, UA: POSITIVE — AB
Spec Grav, UA: 1.015 (ref 1.010–1.025)
UROBILINOGEN UA: 0.2 U/dL
pH, UA: 6 (ref 5.0–8.0)

## 2017-06-12 NOTE — Progress Notes (Signed)
Subjective:    Patient ID: Sabrina English, female    DOB: [birth year], 84 y.o.   MRN: [id number]  HPI 84 year old Female for health maintenance exam and evaluation of medical issues. Has had some nausea recently for about a week. No vomiting or diarrhea. Maybe related to taking vitamins and not eating alot beforehand. Is on PPI for GERD.  Order for Shingrix vaccine given.  Referring to podiatrist for left great toe nail trimming.  Hx Meniere's disease and followed by Dr. Lu Duffel at [facility].   History of bilateral macular degeneration.  History of hearing loss currently does not use hearing aids.  History of osteopenia.  Sometimes has an esophageal spasm when she eats food.  No known drug allergies.  Take Zocor for hyperlipidemia and calcium and vitamin D supplements for osteopenia.  She takes  Dyazide for Mnire's disease and meclizine as needed for vertigo.   History of bilateral impacted cerumen removed by Dr. Lu Duffel She has a question about onychomycosis and was referred to podiatrist.  History of left bunionectomy 1996.  History of C7 radiculopathy currently not an issue.  She had melanoma left shoulder 1992 treated at [facility] with immunotherapy by Dr. Geroge Baseman.  Social history: She is a widow.  3 adult children 2 daughters and a son.  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  She does not smoke or consume alcohol.  Family history: Father died at age 24 of prostate cancer.  Mother died at age 44 of Alzheimer's disease complications.  Sister died at age 63 with chronic lung disease.  One sister died of colon cancer at age 2.  One brother in good health.  Younger brother in fair health.  Used to take Fosamax for osteopenia but no longer does so.  Review of Systems  HENT:       History of bilateral impacted cerumen due to stenosis of ear canals  Neurological:       Bilateral sensorineural hearing loss  Psychiatric/Behavioral:         Mild anxiety       Objective:   Physical Exam  Constitutional: She is oriented to person, place, and time. She appears well-developed and well-nourished. No distress.  HENT:  Head: Normocephalic and atraumatic.  Right Ear: External ear normal.  Left Ear: External ear normal.  Mouth/Throat: Oropharynx is clear and moist.  Eyes: Pupils are equal, round, and reactive to light. EOM are normal. Right eye exhibits no discharge. Left eye exhibits no discharge. No scleral icterus.  Neck: Neck supple. No JVD present. No thyromegaly present.  Cardiovascular: Normal rate, regular rhythm, normal heart sounds and intact distal pulses.  No murmur heard. Pulmonary/Chest: Effort normal and breath sounds normal. No stridor. No respiratory distress. She has no wheezes. She has no rales. She exhibits no tenderness.  Breasts without masses  Abdominal: Soft. Bowel sounds are normal. She exhibits no distension and no mass. There is no tenderness. There is no rebound and no guarding.  Genitourinary:  Genitourinary Comments: Pap deferred due to age.  Bimanual normal.  Musculoskeletal: She exhibits no edema.  Neurological: She is alert and oriented to person, place, and time. She displays normal reflexes. Coordination normal.  Skin: Skin is warm and dry. She is not diaphoretic.  Psychiatric: She has a normal mood and affect. Her behavior is normal. Judgment and thought content normal.  Vitals reviewed.         Assessment &  Plan:  History of Mnire's disease treated with diuretic with Dr. Lu Duffel at [facility]  History of impacted cerumen- Dr. May generally cleans her ear canals twice yearly  Sensorineural hearing loss likely would benefit from hearing aids  History of melanoma left shoulder 1992 and currently is not an issue  Insomnia-takes Xanax sparingly if needed  Hyperlipidemia treated with statin therapy and stable  History of macular degeneration followed by  ophthalmologist Osteopenia- formally took Fosamax but no longer does so  History of chronic kidney disease stage III -continue to monitor  Laryngopharyngeal reflux disease followed by Dr. Neldon Mc  Allergic rhinitis followed by Dr. Neldon Mc  History of low TSH with normal free T4  Plan: Continue same medications and return in 6 months or as needed.  Subjective:   Patient presents for Medicare Annual/Subsequent preventive examination.  Review Past Medical/Family/Social: See above   Risk Factors  Current exercise habits: Mostly sedentary walks a little bit Dietary issues discussed: Low-fat low carbohydrate  Cardiac risk factors: Hyperlipidemia  Depression Screen  (Note: if answer to either of the following is "Yes", a more complete depression screening is indicated)   Over the past two weeks, have you felt down, depressed or hopeless? No  Over the past two weeks, have you felt little interest or pleasure in doing things? No Have you lost interest or pleasure in daily life? No Do you often feel hopeless? No Do you cry easily over simple problems? No   Activities of Daily Living  In your present state of health, do you have any difficulty performing the following activities?:   Driving? No  Managing money? No  Feeding yourself? No  Getting from bed to chair? No  Climbing a flight of stairs? No  Preparing food and eating?: No  Bathing or showering? No  Getting dressed: No  Getting to the toilet? No  Using the toilet:No  Moving around from place to place: No  In the past year have you fallen or had a near fall?:No  Are you sexually active? No  Do you have more than one partner? No   Hearing Difficulties: No  Do you often ask people to speak up or repeat themselves? yes Do you experience ringing or noises in your ears? yes Do you have difficulty understanding soft or whispered voices?  yes Do you feel that you have a problem with memory? No Do you often misplace items?  No    Home Safety:  Do you have a smoke alarm at your residence? Yes Do you have grab bars in the bathroom?  No Do you have throw rugs in your house?  Yes   Cognitive Testing  Alert? Yes Normal Appearance?Yes  Oriented to person? Yes Place? Yes  Time? Yes  Recall of three objects? Yes  Can perform simple calculations? Yes  Displays appropriate judgment?Yes  Can read the correct time from a watch face?Yes   List the Names of Other Physician/Practitioners you currently use:  See referral list for the physicians patient is currently seeing.     Review of Systems: See above   Objective:     General appearance: Appears stated age and thin Head: Normocephalic, without obvious abnormality, atraumatic  Eyes: conj clear, EOMi PEERLA  Ears: normal TM's and external ear canals both ears  Nose: Nares normal. Septum midline. Mucosa normal. No drainage or sinus tenderness.  Throat: lips, mucosa, and tongue normal; teeth and gums normal  Neck: no adenopathy, no carotid bruit, no JVD, supple, symmetrical,  trachea midline and thyroid not enlarged, symmetric, no tenderness/mass/nodules  No CVA tenderness.  Lungs: clear to auscultation bilaterally  Breasts:normal.  Heart: regular rate and rhythm, S1, S2 normal, no murmur, click, rub or gallop  Abdomen: soft, non-tender; bowel sounds normal; no masses, no organomegaly  Musculoskeletal: ROM normal in all joints, no crepitus, no deformity, Normal muscle strengthen. Back  is symmetric, no curvature. Skin: Skin color, texture, turgor normal. No rashes or lesions  Lymph nodes: Cervical, supraclavicular, and axillary nodes normal.  Neurologic: CN 2 -12 Normal, Normal symmetric reflexes. Normal coordination and gait  Psych: Alert & Oriented x 3, Mood appear stable.    Assessment:    Annual wellness medicare exam   Plan:    During the course of the visit the patient was educated and counseled about appropriate screening and preventive  services including:   Annual mammogram  Annual flu vaccine     Patient Instructions (the written plan) was given to the patient.  Medicare Attestation  I have personally reviewed:  The patient's medical and social history  Their use of alcohol, tobacco or illicit drugs  Their current medications and supplements  The patient's functional ability including ADLs,fall risks, home safety risks, cognitive, and hearing and visual impairment  Diet and physical activities  Evidence for depression or mood disorders  The patient's weight, height, BMI, and visual acuity have been recorded in the chart. I have made referrals, counseling, and provided education to the patient based on review of the above and I have provided the patient with a written personalized care plan for preventive services.

## 2017-06-15 ENCOUNTER — Other Ambulatory Visit: Payer: Self-pay

## 2017-06-15 MED ORDER — MECLIZINE HCL 25 MG PO TABS: 25.0000 mg | ORAL_TABLET | Freq: Three times a day (TID) | ORAL | 11 refills | Status: DC | PRN

## 2017-06-15 NOTE — Telephone Encounter (Signed)
Patient is calling to get a refill on Meclizine 25mg , she was here on 06/12/17 for a CPE. Okay to send?

## 2017-06-19 ENCOUNTER — Encounter: Payer: Self-pay | Admitting: Internal Medicine

## 2017-06-19 ENCOUNTER — Encounter: Payer: Self-pay | Admitting: *Deleted

## 2017-06-19 DIAGNOSIS — M8589 Other specified disorders of bone density and structure, multiple sites: Secondary | ICD-10-CM | POA: Diagnosis not present

## 2017-06-19 DIAGNOSIS — Z1231 Encounter for screening mammogram for malignant neoplasm of breast: Secondary | ICD-10-CM | POA: Diagnosis not present

## 2017-06-19 LAB — HM MAMMOGRAPHY

## 2017-06-20 ENCOUNTER — Ambulatory Visit (INDEPENDENT_AMBULATORY_CARE_PROVIDER_SITE_OTHER): Payer: Medicare Other | Admitting: Sports Medicine

## 2017-06-20 ENCOUNTER — Encounter: Payer: Self-pay | Admitting: Sports Medicine

## 2017-06-20 DIAGNOSIS — B351 Tinea unguium: Secondary | ICD-10-CM

## 2017-06-20 DIAGNOSIS — M2032 Hallux varus (acquired), left foot: Secondary | ICD-10-CM | POA: Diagnosis not present

## 2017-06-20 DIAGNOSIS — M79675 Pain in left toe(s): Secondary | ICD-10-CM | POA: Diagnosis not present

## 2017-06-20 NOTE — Progress Notes (Signed)
Subjective: Sabrina English is a 84 y.o. female patient seen today in office with complaint of mildly painful thickened left 1st toenail that was sore 1 month ago and was hurting with shoes rubbing to toe but now is better after using lamisil cream to toe. Patient denies history of Diabetes, Neuropathy, or Vascular disease. History of previous foot surgery years ago that was overcorrect and left with no pain. Patient has no other pedal complaints at this time.   Review of Systems  Musculoskeletal: Positive for myalgias.  All other systems reviewed and are negative.    Patient Active Problem List   Diagnosis Date Noted  . Malignant melanoma of upper extremity, including shoulder ([place]) 07/01/2016  . Pulmonary nodules 06/05/2014  . Macular degeneration of both eyes 08/16/2011  . Meniere disease 05/27/2010  . Hyperlipidemia 05/27/2010  . GE reflux 05/27/2010  . Osteopenia 05/27/2010    Current Outpatient Medications on File Prior to Visit  Medication Sig Dispense Refill  . ALPRAZolam (XANAX) 0.25 MG tablet Take 1 tablet (0.25 mg total) by mouth at bedtime as needed. for sleep 30 tablet 5  . aspirin 81 MG EC tablet Take 81 mg by mouth daily.      . budesonide (RHINOCORT ALLERGY) 32 MCG/ACT nasal spray Place 1 spray into both nostrils daily.    . calcium-vitamin D (OSCAL WITH D) 500-200 MG-UNIT per tablet Take 1 tablet by mouth daily.      . cyclobenzaprine (FLEXERIL) 5 MG tablet Take 1 tablet (5 mg total) by mouth 3 (three) times daily as needed for muscle spasms. 30 tablet 0  . dicyclomine (BENTYL) 20 MG tablet Take 1 tablet (20 mg total) by mouth every 6 (six) hours. 120 tablet 1  . Homeopathic Products ([employer]S LEG CRAMPS PAIN RELIEF PO) Take 3 tablets by mouth.    Marland Kitchen HYDROcodone-homatropine (HYCODAN) 5-1.5 MG/5ML syrup Take 5 mLs every 8 (eight) hours as needed by mouth for cough. 120 mL 0  . ipratropium (ATROVENT) 0.06 % nasal spray Use two sprays in each nostril every 6 hours if needed  to dry up nose 15 mL 11  . meclizine (ANTIVERT) 25 MG tablet Take 1 tablet (25 mg total) by mouth 3 (three) times daily as needed. 30 tablet 11  . Multiple Vitamins-Minerals (PRESERVISION AREDS 2 PO) Take by mouth 2 (two) times daily.    . naproxen sodium (ANAPROX) 220 MG tablet Take 220 mg by mouth 2 (two) times daily as needed (for pain).    Marland Kitchen omeprazole (PRILOSEC) 20 MG capsule TAKE ONE CAPSULE EVERY MORNING BEFORE BREAKFAST 30 capsule 10  . simvastatin (ZOCOR) 20 MG tablet TAKE 1 TABLET BY MOUTH DAILY AT 6 PM. 90 tablet 3  . traMADol (ULTRAM) 50 MG tablet Take 1 tablet (50 mg total) by mouth every 12 (twelve) hours as needed. 30 tablet 0  . triamterene-hydrochlorothiazide (DYAZIDE) 37.5-25 MG capsule TAKE ONE CAPSULE EVERY DAY 90 capsule 3   No current facility-administered medications on file prior to visit.     Allergies  Allergen Reactions  . Levaquin [Levofloxacin In D5w] Other (See Comments)    Lower extremity pain     Objective: Physical Exam  General: Well developed, nourished, no acute distress, awake, alert and oriented x 3  Vascular: Dorsalis pedis artery 1/4 bilateral, Posterior tibial artery 1/4 bilateral, skin temperature warm to warm proximal to distal bilateral lower extremities, + varicosities, scant pedal hair present bilateral.  Neurological: Gross sensation present via light touch bilateral.   Dermatological:  Skin is warm, dry, and supple bilateral, Nails 1-10 are shoert and thick. No tenderness to left 1st toenail like previous, No signs of infection bilateral.  Musculoskeletal: +Hammertoe and hallux varus from previous bunion surgery asymptomatic boney deformities noted bilateral. Muscular strength within normal limits without pain on range of motion. No pain with calf compression bilateral.  Assessment and Plan:  Problem List Items Addressed This Visit    None    Visit Diagnoses    Onychomycosis    -  Primary   Toe pain, left       Acquired hallux varus  of left foot         -Examined patient.  -Discussed treatment options for resolved left great toenail pain/mycosis -Dispensed toe cap for left 1st toe -Continue with lamisil cream to toe  -Patient to return as needed or sooner if symptoms worsen.  Landis Martins, DPM

## 2017-06-28 ENCOUNTER — Encounter: Payer: Self-pay | Admitting: Internal Medicine

## 2017-07-01 ENCOUNTER — Encounter: Payer: Self-pay | Admitting: Internal Medicine

## 2017-07-01 NOTE — Patient Instructions (Signed)
It was a pleasure to see you today.  May stop baby aspirin.  Continue other medications as previously prescribed and follow-up in 6 months.

## 2017-09-28 DIAGNOSIS — H353124 Nonexudative age-related macular degeneration, left eye, advanced atrophic with subfoveal involvement: Secondary | ICD-10-CM | POA: Diagnosis not present

## 2017-09-28 DIAGNOSIS — H43813 Vitreous degeneration, bilateral: Secondary | ICD-10-CM | POA: Diagnosis not present

## 2017-09-28 DIAGNOSIS — H353113 Nonexudative age-related macular degeneration, right eye, advanced atrophic without subfoveal involvement: Secondary | ICD-10-CM | POA: Diagnosis not present

## 2017-09-29 ENCOUNTER — Ambulatory Visit (INDEPENDENT_AMBULATORY_CARE_PROVIDER_SITE_OTHER): Payer: Medicare Other | Admitting: Internal Medicine

## 2017-09-29 DIAGNOSIS — Z23 Encounter for immunization: Secondary | ICD-10-CM

## 2017-09-29 NOTE — Progress Notes (Signed)
   Subjective:    Patient ID: Sabrina English, female    DOB: 1933-03-04, 84 y.o.   MRN: [id number]  HPI Flu vaccine given by CMA    Review of Systems     Objective:   Physical Exam        Assessment & Plan:

## 2017-09-29 NOTE — Patient Instructions (Signed)
Patient received a flu vaccine IM L deltoid, AV, CMA  

## 2017-10-02 ENCOUNTER — Encounter: Payer: Self-pay | Admitting: Internal Medicine

## 2017-10-02 ENCOUNTER — Ambulatory Visit (INDEPENDENT_AMBULATORY_CARE_PROVIDER_SITE_OTHER): Payer: Medicare Other | Admitting: Internal Medicine

## 2017-10-02 ENCOUNTER — Ambulatory Visit
Admission: RE | Admit: 2017-10-02 | Discharge: 2017-10-02 | Disposition: A | Discharge status: Home / Self Care | Payer: Medicare Other | Source: Ambulatory Visit | Attending: Internal Medicine | Admitting: Internal Medicine

## 2017-10-02 VITALS — BP 140/70 | HR 77 | Temp 98.2°F | Ht 62.5 in | Wt 155.0 lb

## 2017-10-02 DIAGNOSIS — M546 Pain in thoracic spine: Secondary | ICD-10-CM

## 2017-10-02 DIAGNOSIS — R1030 Lower abdominal pain, unspecified: Secondary | ICD-10-CM | POA: Diagnosis not present

## 2017-10-02 DIAGNOSIS — R109 Unspecified abdominal pain: Secondary | ICD-10-CM | POA: Diagnosis not present

## 2017-10-02 LAB — POCT URINALYSIS DIPSTICK
Appearance: NEGATIVE
Bilirubin, UA: NEGATIVE
GLUCOSE UA: NEGATIVE
KETONES UA: NEGATIVE
Leukocytes, UA: NEGATIVE
Nitrite, UA: NEGATIVE
Odor: NEGATIVE
Protein, UA: NEGATIVE
RBC UA: NEGATIVE
SPEC GRAV UA: 1.01 (ref 1.010–1.025)
Urobilinogen, UA: 0.2 E.U./dL
pH, UA: 6 (ref 5.0–8.0)

## 2017-10-02 NOTE — Patient Instructions (Signed)
Take Tylenol as needed for musculoskeletal pain.  Have KUB today for evaluation of abdominal pain.  Urine dipstick is negative.

## 2017-10-02 NOTE — Progress Notes (Signed)
   Subjective:    Patient ID: Sabrina English, female    DOB: 1933/07/22, 84 y.o.   MRN: [id number]  HPI Onset yesterday of some lower abdominal pain and some left parathoracic back pain.  Does not recall any activity that would have caused back pain.  Had lumbar spine x-rays in July 2018 with onset of acute lower back pain radiating to right leg.  At that time there was mild diffuse degenerative change along the lower thoracic and lumbar spine.  Grade 1 retrolisthesis of L2 on L3 and L3 on L4.  Says she is not constipated.  Urine dipstick is normal.  No nausea or vomiting.      Review of Systems see above     Objective:   Physical Exam No CVA tenderness.  She is tender along her left posterior lower rib.  Chest is clear.  No CVA tenderness.  Abdomen: Bowel sounds are slightly increased.  No hepatosplenomegaly masses or tenderness.  Abdomen is soft and nondistended.       Assessment & Plan:  Lower abdominal pain.  Urine dipstick is negative.  To have KUB for further evaluation.  Suspect constipation  Left parathoracic rib cage pain.  Recommend Tylenol for pain control and heating pad.  Suspect musculoskeletal pain

## 2017-12-06 DIAGNOSIS — H524 Presbyopia: Secondary | ICD-10-CM | POA: Diagnosis not present

## 2017-12-06 DIAGNOSIS — H353113 Nonexudative age-related macular degeneration, right eye, advanced atrophic without subfoveal involvement: Secondary | ICD-10-CM | POA: Diagnosis not present

## 2017-12-06 DIAGNOSIS — Z961 Presence of intraocular lens: Secondary | ICD-10-CM | POA: Diagnosis not present

## 2017-12-06 DIAGNOSIS — H43813 Vitreous degeneration, bilateral: Secondary | ICD-10-CM | POA: Diagnosis not present

## 2018-01-29 DIAGNOSIS — H906 Mixed conductive and sensorineural hearing loss, bilateral: Secondary | ICD-10-CM | POA: Diagnosis not present

## 2018-01-29 DIAGNOSIS — H61303 Acquired stenosis of external ear canal, unspecified, bilateral: Secondary | ICD-10-CM | POA: Diagnosis not present

## 2018-01-29 DIAGNOSIS — J342 Deviated nasal septum: Secondary | ICD-10-CM | POA: Diagnosis not present

## 2018-01-29 DIAGNOSIS — H6123 Impacted cerumen, bilateral: Secondary | ICD-10-CM | POA: Diagnosis not present

## 2018-02-12 ENCOUNTER — Other Ambulatory Visit: Payer: Self-pay | Admitting: Internal Medicine

## 2018-02-14 ENCOUNTER — Ambulatory Visit: Payer: Medicare Other | Admitting: Allergy and Immunology

## 2018-02-16 ENCOUNTER — Other Ambulatory Visit: Payer: Self-pay

## 2018-02-16 MED ORDER — SIMVASTATIN 20 MG PO TABS: ORAL_TABLET | ORAL | 3 refills | Status: DC

## 2018-02-19 DIAGNOSIS — Z961 Presence of intraocular lens: Secondary | ICD-10-CM | POA: Diagnosis not present

## 2018-02-19 DIAGNOSIS — H353112 Nonexudative age-related macular degeneration, right eye, intermediate dry stage: Secondary | ICD-10-CM | POA: Diagnosis not present

## 2018-02-19 DIAGNOSIS — H3122 Choroidal dystrophy (central areolar) (generalized) (peripapillary): Secondary | ICD-10-CM | POA: Diagnosis not present

## 2018-02-19 DIAGNOSIS — H353124 Nonexudative age-related macular degeneration, left eye, advanced atrophic with subfoveal involvement: Secondary | ICD-10-CM | POA: Diagnosis not present

## 2018-02-19 DIAGNOSIS — H35453 Secondary pigmentary degeneration, bilateral: Secondary | ICD-10-CM | POA: Diagnosis not present

## 2018-02-19 DIAGNOSIS — H35363 Drusen (degenerative) of macula, bilateral: Secondary | ICD-10-CM | POA: Diagnosis not present

## 2018-02-20 ENCOUNTER — Encounter: Payer: Self-pay | Admitting: Allergy and Immunology

## 2018-02-20 ENCOUNTER — Ambulatory Visit (INDEPENDENT_AMBULATORY_CARE_PROVIDER_SITE_OTHER): Payer: Medicare Other | Admitting: Allergy and Immunology

## 2018-02-20 VITALS — BP 118/70 | HR 68 | Resp 16 | Ht 62.5 in | Wt 157.0 lb

## 2018-02-20 DIAGNOSIS — J3089 Other allergic rhinitis: Secondary | ICD-10-CM | POA: Diagnosis not present

## 2018-02-20 DIAGNOSIS — J310 Chronic rhinitis: Secondary | ICD-10-CM

## 2018-02-20 DIAGNOSIS — K219 Gastro-esophageal reflux disease without esophagitis: Secondary | ICD-10-CM | POA: Diagnosis not present

## 2018-02-20 NOTE — Patient Instructions (Addendum)
  1. Continue to Treat and prevent inflammation:   A. OTC Rhinocort one spray each nostril one time per day.   2. Continue to Treat and prevent reflux:   A. Continue to eliminate all caffeine consumption  B. omeprazole 20 mg 1-2 times per day  3. If needed:    A. OTC antihistamine - Claritin/Allegra/Zyrtec  B. Nasal ipratropium 0.06% 2 sprays each nostril every 6 hours to dry up nose.  4. Return to clinic in 12 months or earlier if problem

## 2018-02-20 NOTE — Progress Notes (Signed)
Follow-up Note  Referring Provider: Elby Showers, MD Primary Provider: Elby Showers, MD Date of Office Visit: 02/20/2018  Subjective:   Sabrina English (DOB: February 03, 1933) is a 85 y.o. female who returns to the [place] on 02/20/2018 in re-evaluation of the following:  HPI: Bahar returns to this clinic in reevaluation of LPR and allergic rhinitis and gustatory rhinitis.  Her last visit to this clinic was 15 February 2017.  She has really done quite well with all of her conditions.  She believes that she has had very good control of the issues with her nose and her throat and reflux while utilizing a nasal steroid and a proton pump inhibitor on a consistent basis.  She intermittently uses her nasal ipratropium.  She remains away from all caffeine consumption at this point.  For the past week she has had some belching and throat clearing and a slight cough without any fever or chest pain or sputum production or nasal symptoms.  Allergies as of 02/20/2018      Reactions   Levaquin [levofloxacin In D5w] Other (See Comments)   Lower extremity pain       Medication List      ALPRAZolam 0.25 MG tablet Commonly known as:  XANAX Take 1 tablet (0.25 mg total) by mouth at bedtime as needed. for sleep   calcium-vitamin D 500-200 MG-UNIT tablet Commonly known as:  OSCAL WITH D Take 1 tablet by mouth daily.   [employer] LEG CRAMPS PAIN RELIEF PO Take 3 tablets by mouth.   cyclobenzaprine 5 MG tablet Commonly known as:  FLEXERIL Take 1 tablet (5 mg total) by mouth 3 (three) times daily as needed for muscle spasms.   dicyclomine 20 MG tablet Commonly known as:  BENTYL Take 1 tablet (20 mg total) by mouth every 6 (six) hours.   ipratropium 0.06 % nasal spray Commonly known as:  ATROVENT Use two sprays in each nostril every 6 hours if needed to dry up nose   meclizine 25 MG tablet Commonly known as:  ANTIVERT Take 1 tablet (25 mg total) by mouth 3 (three) times  daily as needed.   naproxen sodium 220 MG tablet Commonly known as:  ALEVE Take 220 mg by mouth 2 (two) times daily as needed (for pain).   omeprazole 20 MG capsule Commonly known as:  PRILOSEC TAKE ONE CAPSULE EVERY MORNING BEFORE BREAKFAST   PRESERVISION AREDS 2 PO Take by mouth 2 (two) times daily.   RHINOCORT ALLERGY 32 MCG/ACT nasal spray Generic drug:  budesonide Place 1 spray into both nostrils daily.   simvastatin 20 MG tablet Commonly known as:  ZOCOR TAKE 1 TABLET BY MOUTH DAILY AT 6 PM.   traMADol 50 MG tablet Commonly known as:  ULTRAM Take 1 tablet (50 mg total) by mouth every 12 (twelve) hours as needed.   triamterene-hydrochlorothiazide 37.5-25 MG capsule Commonly known as:  DYAZIDE TAKE 1 CAPSULE BY MOUTH EVERY DAY       Past Medical History:  Diagnosis Date  . Cancer (HCC)    melanoma l shoulder  . GERD (gastroesophageal reflux disease)   . Hyperlipidemia   . Macular degeneration   . Meniere's disease   . Osteoporosis    osteopenia    Past Surgical History:  Procedure Laterality Date  . AXILLARY NODE DISSECTION  1983  . BUNIONECTOMY    . CATARACT Bilateral   . MELANOMA EXCISION     left shoulder  Review of systems negative except as noted in HPI / PMHx or noted below:  Review of Systems  Constitutional: Negative.   HENT: Negative.   Eyes: Negative.   Respiratory: Negative.   Cardiovascular: Negative.   Gastrointestinal: Negative.   Genitourinary: Negative.   Musculoskeletal: Negative.   Skin: Negative.   Neurological: Negative.   Endo/Heme/Allergies: Negative.   Psychiatric/Behavioral: Negative.      Objective:   Vitals:   02/20/18 1457  BP: 118/70  Pulse: 68  Resp: 16  SpO2: 98%   Height: 5' 2.5" (158.8 cm)  Weight: 157 lb (71.2 kg)   Physical Exam Constitutional:      Appearance: She is not diaphoretic.  HENT:     Head: Normocephalic.     Right Ear: Tympanic membrane, ear canal and external ear normal.      Left Ear: Tympanic membrane, ear canal and external ear normal.     Nose: Nose normal. No mucosal edema or rhinorrhea.     Mouth/Throat:     Pharynx: Uvula midline. No oropharyngeal exudate.  Eyes:     Conjunctiva/sclera: Conjunctivae normal.  Neck:     Thyroid: No thyromegaly.     Trachea: Trachea normal. No tracheal tenderness or tracheal deviation.  Cardiovascular:     Rate and Rhythm: Normal rate and regular rhythm.     Heart sounds: Normal heart sounds, S1 normal and S2 normal. No murmur.  Pulmonary:     Effort: No respiratory distress.     Breath sounds: Normal breath sounds. No stridor. No wheezing or rales.  Lymphadenopathy:     Head:     Right side of head: No tonsillar adenopathy.     Left side of head: No tonsillar adenopathy.     Cervical: No cervical adenopathy.  Skin:    Findings: No erythema or rash.     Nails: There is no clubbing.   Neurological:     Mental Status: She is alert.     Diagnostics: none  Assessment and Plan:   1. LPRD (laryngopharyngeal reflux disease)   2. Other allergic rhinitis   3. Gustatory rhinitis     1. Continue to Treat and prevent inflammation:   A. OTC Rhinocort one spray each nostril one time per day.   2. Continue to Treat and prevent reflux:   A. Continue to eliminate all caffeine consumption  B. omeprazole 20 mg 1-2 times per day  3. If needed:    A. OTC antihistamine - Claritin/Allegra/Zyrtec  B. Nasal ipratropium 0.06% 2 sprays each nostril every 6 hours to dry up nose.  4. Return to clinic in 12 months or earlier if problem   Clytie appears to be doing relatively well on her current therapy although certainly over the course of the past week she has had a little bit more problem with reflux and throat issues and a slight cough.  This may all be a viral infection or this may be just a issue that has set off her reflux.  At this point I am going to have her increase her dose of omeprazole to twice a day for a week or  so while she continues on all of her other medications directed against respiratory tract inflammation and will just see what happens.  I will hold off on any further evaluation or treatment for any other etiologic factor contributing to her respiratory tract symptoms at this point in time.  Assuming she does well I will see her back in this clinic  in 12 months or earlier if there is a problem.  [name], MD Allergy / Immunology [place]

## 2018-02-21 ENCOUNTER — Encounter: Payer: Self-pay | Admitting: Allergy and Immunology

## 2018-04-09 ENCOUNTER — Other Ambulatory Visit: Payer: Self-pay

## 2018-04-09 MED ORDER — OMEPRAZOLE 20 MG PO CPDR: DELAYED_RELEASE_CAPSULE | ORAL | 5 refills | Status: DC

## 2018-06-14 ENCOUNTER — Other Ambulatory Visit: Payer: Medicare Other | Admitting: Internal Medicine

## 2018-06-14 ENCOUNTER — Other Ambulatory Visit: Payer: Self-pay

## 2018-06-14 DIAGNOSIS — H8109 Meniere's disease, unspecified ear: Secondary | ICD-10-CM | POA: Diagnosis not present

## 2018-06-14 DIAGNOSIS — M8580 Other specified disorders of bone density and structure, unspecified site: Secondary | ICD-10-CM

## 2018-06-14 DIAGNOSIS — G4700 Insomnia, unspecified: Secondary | ICD-10-CM | POA: Diagnosis not present

## 2018-06-14 DIAGNOSIS — E7849 Other hyperlipidemia: Secondary | ICD-10-CM

## 2018-06-14 DIAGNOSIS — J309 Allergic rhinitis, unspecified: Secondary | ICD-10-CM

## 2018-06-14 DIAGNOSIS — Z0000 Encounter for general adult medical examination without abnormal findings: Secondary | ICD-10-CM

## 2018-06-14 DIAGNOSIS — H905 Unspecified sensorineural hearing loss: Secondary | ICD-10-CM | POA: Diagnosis not present

## 2018-06-15 LAB — LIPID PANEL
Cholesterol: 199 mg/dL (ref <200)
HDL: 90 mg/dL (ref >=50)
LDL Cholesterol (Calc): 90 mg/dL (calc)
Non-HDL Cholesterol (Calc): 109 mg/dL (calc) (ref <130)
Total CHOL/HDL Ratio: 2.2 (calc) (ref <5.0)
Triglycerides: 91 mg/dL (ref <150)

## 2018-06-15 LAB — CBC WITH DIFFERENTIAL/PLATELET
Absolute Monocytes: 340 cells/uL (ref 200–950)
Basophils Absolute: 39 cells/uL (ref 0–200)
Basophils Relative: 1.1 %
Eosinophils Absolute: 109 cells/uL (ref 15–500)
Eosinophils Relative: 3.1 %
HCT: 38.5 % (ref 35.0–45.0)
Hemoglobin: 12.9 g/dL (ref 11.7–15.5)
Lymphs Abs: 1554 cells/uL (ref 850–3900)
MCH: 28.7 pg (ref 27.0–33.0)
MCHC: 33.5 g/dL (ref 32.0–36.0)
MCV: 85.7 fL (ref 80.0–100.0)
MPV: 9.9 fL (ref 7.5–12.5)
Monocytes Relative: 9.7 %
Neutro Abs: 1460 cells/uL — ABNORMAL LOW (ref 1500–7800)
Neutrophils Relative %: 41.7 %
Platelets: 276 10*3/uL (ref 140–400)
RBC: 4.49 10*6/uL (ref 3.80–5.10)
RDW: 12.7 % (ref 11.0–15.0)
Total Lymphocyte: 44.4 %
WBC: 3.5 10*3/uL — ABNORMAL LOW (ref 3.8–10.8)

## 2018-06-15 LAB — COMPLETE METABOLIC PANEL WITH GFR
AG Ratio: 1.7 (calc) (ref 1.0–2.5)
ALT: 14 U/L (ref 6–29)
AST: 20 U/L (ref 10–35)
Albumin: 4 g/dL (ref 3.6–5.1)
Alkaline phosphatase (APISO): 76 U/L (ref 37–153)
BUN/Creatinine Ratio: 17 (calc) (ref 6–22)
BUN: 26 mg/dL — ABNORMAL HIGH (ref 7–25)
CO2: 27 mmol/L (ref 20–32)
Calcium: 9.7 mg/dL (ref 8.6–10.4)
Chloride: 104 mmol/L (ref 98–110)
Creat: 1.49 mg/dL — ABNORMAL HIGH (ref 0.60–0.88)
GFR, Est African American: 37 mL/min/{1.73_m2} — ABNORMAL LOW (ref >=60)
GFR, Est Non African American: 32 mL/min/{1.73_m2} — ABNORMAL LOW (ref >=60)
Globulin: 2.3 g/dL (calc) (ref 1.9–3.7)
Glucose, Bld: 97 mg/dL (ref 65–99)
Potassium: 3.7 mmol/L (ref 3.5–5.3)
Sodium: 141 mmol/L (ref 135–146)
Total Bilirubin: 0.7 mg/dL (ref 0.2–1.2)
Total Protein: 6.3 g/dL (ref 6.1–8.1)

## 2018-06-15 LAB — TSH: TSH: 0.26 mIU/L — ABNORMAL LOW (ref 0.40–4.50)

## 2018-06-18 ENCOUNTER — Other Ambulatory Visit: Payer: Self-pay

## 2018-06-18 ENCOUNTER — Ambulatory Visit (INDEPENDENT_AMBULATORY_CARE_PROVIDER_SITE_OTHER): Payer: Medicare Other | Admitting: Internal Medicine

## 2018-06-18 ENCOUNTER — Encounter: Payer: Self-pay | Admitting: Internal Medicine

## 2018-06-18 VITALS — BP 128/70 | HR 74 | Temp 98.0°F | Ht 62.5 in | Wt 149.0 lb

## 2018-06-18 DIAGNOSIS — H903 Sensorineural hearing loss, bilateral: Secondary | ICD-10-CM

## 2018-06-18 DIAGNOSIS — H3530 Unspecified macular degeneration: Secondary | ICD-10-CM

## 2018-06-18 DIAGNOSIS — N183 Chronic kidney disease, stage 3 unspecified: Secondary | ICD-10-CM

## 2018-06-18 DIAGNOSIS — E039 Hypothyroidism, unspecified: Secondary | ICD-10-CM | POA: Diagnosis not present

## 2018-06-18 DIAGNOSIS — Z0000 Encounter for general adult medical examination without abnormal findings: Secondary | ICD-10-CM

## 2018-06-18 DIAGNOSIS — R7989 Other specified abnormal findings of blood chemistry: Secondary | ICD-10-CM | POA: Diagnosis not present

## 2018-06-18 DIAGNOSIS — H8109 Meniere's disease, unspecified ear: Secondary | ICD-10-CM

## 2018-06-18 MED ORDER — MECLIZINE HCL 25 MG PO TABS: 25.0000 mg | ORAL_TABLET | Freq: Three times a day (TID) | ORAL | 11 refills | Status: DC | PRN

## 2018-06-18 MED ORDER — DICYCLOMINE HCL 20 MG PO TABS: 20.0000 mg | ORAL_TABLET | Freq: Four times a day (QID) | ORAL | 1 refills | Status: DC

## 2018-06-18 MED ORDER — ALPRAZOLAM 0.25 MG PO TABS: 0.2500 mg | ORAL_TABLET | Freq: Every evening | ORAL | 5 refills | Status: DC | PRN

## 2018-06-18 NOTE — Patient Instructions (Addendum)
Referral was made to [facility] for elevated serum creatinine and to [facility] for low TSH with normal free T4

## 2018-06-18 NOTE — Progress Notes (Signed)
Subjective:    Patient ID: Sabrina English, female    DOB: 1933/09/02, 85 y.o.   MRN: [id number]  HPI 85 year old Female for health maintenance exam, evaluation of medical issues and Medicare annual wellness visit. Seen in person today.  Labs reviewed. Her TSH remains low. Free T4 added today and is WNL. Not on thyroid replacement. Refer to Endocrine.  She seems to have developed CKD. Does not drink a lot of fluid daily and never has she says. Has had one small glass of OJ today and Creatinine repeated and is still elevated. Refer to [facility].  Hx of GERD treated with PPI. Hx of osteopenia.  History of bilateral macular degeneration.  History of Mnire's disease followed at [facility].  History of hearing loss.  Sometimes has an esophageal spasm when she eats food.  No known drug allergies.  Takes Zocor for hyperlipidemia, Dyazide for Mnire's disease and as needed meclizine for vertigo.  Takes calcium and vitamin D supplements for osteopenia.  Left bunionectomy 1996  History of C7 radiculopathy currently not an issue.  She had melanoma left shoulder removed 1992 and subsequently received immunotherapy by Dr. Geroge Baseman at [facility].  Social history: She is a widow.  3 adult children, 2 daughters and a son.  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  She does not smoke or consume alcohol.  Family history: Father died at age 15 of prostate cancer.  Mother died at age 9 of Alzheimer's disease complications.  One sister died of colon cancer at age 88.  One brother in good health.  One brother in fair health. Daughter recently diagnosed with pulmonary sarcoidosis.  She used to take Fosamax for osteopenia for a number of years but no longer does so.      Review of Systems history of mild anxiety, bilateral sensorineural hearing loss, has bilateral impacted cerumen removed at [facility] due to  stenosis of ear canals     Objective:   Physical Exam Blood pressure 128/70, pulse 74, temperature 36.7 C.  Pulse oximetry 96% BMI 26.82.  Weight 149 pounds.  Skin warm and dry.  Nodes none.  She is alert and oriented x3.  Affect is appropriate.  HEENT exam: Head is normocephalic atraumatic.  TMs are clear without impacted cerumen.  She has bilateral hearing aids.  Pharynx is clear.  Neck is supple.  No JVD thyromegaly or carotid bruits.  Chest is clear to auscultation without rales or wheezing.  Cardiac exam regular rate and rhythm normal S1 and S2 without murmurs or gallops.  Abdomen nondistended without hepatosplenomegaly masses or tenderness.  Bimanual exam normal.  No lower extremity edema.  No gross focal deficits on brief neurological exam.       Assessment & Plan:  Mnire's disease treated with Dyazide daily and as needed meclizine for vertigo  Sensorineural hearing loss-has hearing aids  Elevated serum creatinine at 1.49, was recently 1.29 over the past 2 years and in 2017 was 1.25 and 1.31 respectively.  In 2016 creatinine was 1.14.  It is possible that Dyazide and lack of fluid intake is elevating her creatinine at times.  Mild neutropenia-likely benign noticed over the last 2 years white blood cell count ranging from 30 300-30 500.  In 2017 was 3800 but has been as high as 56 and 5120 17 and 2016 respectively.  Hemoglobin is normal as his platelet count.  Low TSH  has been present for at least 5 years and varies between 0.282 and 0.361.  It is now 0.26.  She will be referred to endocrinology.  Free T4 is normal.  History of melanoma many years ago and is cured.  Hyperlipidemia-stable on statin medication  History of GE reflux treated with PPI  Osteopenia-no longer on Fosamax.  Takes calcium and vitamin D supplements.  Plan: Referrals to Endocrinology and [facility] for evaluation of low TSH and elevated serum creatinine.  Return in 6 months here.   Subjective:   Patient presents for Medicare Annual/Subsequent preventive examination.  Review Past Medical/Family/Social: See above   Risk Factors  Current exercise habits: Sedentary Dietary issues discussed: Low-fat low-carb  Cardiac risk factors: Hyperlipidemia  Depression Screen  (Note: if answer to either of the following is "Yes", a more complete depression screening is indicated)   Over the past two weeks, have you felt down, depressed or hopeless? No  Over the past two weeks, have you felt little interest or pleasure in doing things? No Have you lost interest or pleasure in daily life? No Do you often feel hopeless? No Do you cry easily over simple problems? No   Activities of Daily Living  In your present state of health, do you have any difficulty performing the following activities?:   Driving? No  Managing money? No  Feeding yourself? No  Getting from bed to chair? No  Climbing a flight of stairs? No  Preparing food and eating?: No  Bathing or showering? No  Getting dressed: No  Getting to the toilet? No  Using the toilet:No  Moving around from place to place: No  In the past year have you fallen or had a near fall?:No  Are you sexually active? No  Do you have more than one partner? No   Hearing Difficulties: Yes Do you often ask people to speak up or repeat themselves?  Yes Do you experience ringing or noises in your ears?  Yes Do you have difficulty understanding soft or whispered voices?  Yes Do you feel that you have a problem with memory? No Do you often misplace items? No    Home Safety:  Do you have a smoke alarm at your residence? Yes Do you have grab bars in the bathroom?  None Do you have throw rugs in your house?  Yes   Cognitive Testing  Alert? Yes Normal Appearance?Yes  Oriented to person? Yes Place? Yes  Time? Yes  Recall of three objects? Yes  Can perform simple calculations? Yes  Displays appropriate judgment?Yes  Can read the  correct time from a watch face?Yes   List the Names of Other Physician/Practitioners you currently use:  See referral list for the physicians patient is currently seeing.  ENT at [place]: See above   Objective:     General appearance: Appears stated age and thin Head: Normocephalic, without obvious abnormality, atraumatic  Eyes: conj clear, EOMi PEERLA  Ears: normal TM's and external ear canals both ears  Nose: Nares normal. Septum midline. Mucosa normal. No drainage or sinus tenderness.  Throat: lips, mucosa, and tongue normal; teeth and gums normal  Neck: no adenopathy, no carotid bruit, no JVD, supple, symmetrical, trachea midline and thyroid not enlarged, symmetric, no tenderness/mass/nodules  No CVA tenderness.  Lungs: clear to auscultation bilaterally  Breasts: normal appearance, no masses or tenderness Heart: regular rate and rhythm, S1, S2 normal, no murmur, click, rub or gallop  Abdomen:  soft, non-tender; bowel sounds normal; no masses, no organomegaly  Musculoskeletal: ROM normal in all joints, no crepitus, no deformity, Normal muscle strengthen. Back  is symmetric, no curvature. Skin: Skin color, texture, turgor normal. No rashes or lesions  Lymph nodes: Cervical, supraclavicular, and axillary nodes normal.  Neurologic: CN 2 -12 Normal, Normal symmetric reflexes. Normal coordination and gait  Psych: Alert & Oriented x 3, Mood appear stable.    Assessment:    Annual wellness medicare exam   Plan:    During the course of the visit the patient was educated and counseled about appropriate screening and preventive services including:        Patient Instructions (the written plan) was given to the patient.  Medicare Attestation  I have personally reviewed:  The patient's medical and social history  Their use of alcohol, tobacco or illicit drugs  Their current medications and supplements  The patient's functional ability including  ADLs,fall risks, home safety risks, cognitive, and hearing and visual impairment  Diet and physical activities  Evidence for depression or mood disorders  The patient's weight, height, BMI, and visual acuity have been recorded in the chart. I have made referrals, counseling, and provided education to the patient based on review of the above and I have provided the patient with a written personalized care plan for preventive services.

## 2018-06-19 LAB — BUN/CREATININE RATIO
BUN/Creatinine Ratio: 22 (calc) (ref 6–22)
BUN: 33 mg/dL — ABNORMAL HIGH (ref 7–25)
Creat: 1.51 mg/dL — ABNORMAL HIGH (ref 0.60–0.88)
GFR, Est African American: 37 mL/min/{1.73_m2} — ABNORMAL LOW (ref >=60)
GFR, Est Non African American: 32 mL/min/{1.73_m2} — ABNORMAL LOW (ref >=60)

## 2018-06-19 LAB — T4, FREE: Free T4: 1.3 ng/dL (ref 0.8–1.8)

## 2018-06-21 ENCOUNTER — Other Ambulatory Visit: Payer: Self-pay

## 2018-06-21 MED ORDER — ALPRAZOLAM 0.25 MG PO TABS: 0.2500 mg | ORAL_TABLET | Freq: Every evening | ORAL | 5 refills | Status: DC | PRN

## 2018-06-21 NOTE — Telephone Encounter (Signed)
Patient called and states the pharmacy did not receive one out of the three prescriptions for her alprazolam 0.25mg . 2 were sent electronically and the third was ordered as "phone in". The phone in rx was not received by the pharmacy. Please send refill if appropriate I have pended medication.

## 2018-06-28 ENCOUNTER — Encounter: Payer: Self-pay | Admitting: Internal Medicine

## 2018-06-28 DIAGNOSIS — Z1231 Encounter for screening mammogram for malignant neoplasm of breast: Secondary | ICD-10-CM | POA: Diagnosis not present

## 2018-06-29 ENCOUNTER — Telehealth: Payer: Self-pay | Admitting: Internal Medicine

## 2018-06-29 NOTE — Telephone Encounter (Signed)
Faxed 20 pages referral to [facility]

## 2018-07-02 ENCOUNTER — Encounter: Payer: Self-pay | Admitting: Internal Medicine

## 2018-07-14 ENCOUNTER — Other Ambulatory Visit: Payer: Self-pay | Admitting: Internal Medicine

## 2018-07-25 ENCOUNTER — Other Ambulatory Visit: Payer: Self-pay | Admitting: Internal Medicine

## 2018-08-20 DIAGNOSIS — H31092 Other chorioretinal scars, left eye: Secondary | ICD-10-CM | POA: Diagnosis not present

## 2018-08-20 DIAGNOSIS — H35363 Drusen (degenerative) of macula, bilateral: Secondary | ICD-10-CM | POA: Diagnosis not present

## 2018-08-20 DIAGNOSIS — H53412 Scotoma involving central area, left eye: Secondary | ICD-10-CM | POA: Diagnosis not present

## 2018-08-20 DIAGNOSIS — H353124 Nonexudative age-related macular degeneration, left eye, advanced atrophic with subfoveal involvement: Secondary | ICD-10-CM | POA: Diagnosis not present

## 2018-08-20 DIAGNOSIS — Z961 Presence of intraocular lens: Secondary | ICD-10-CM | POA: Diagnosis not present

## 2018-08-20 DIAGNOSIS — H35453 Secondary pigmentary degeneration, bilateral: Secondary | ICD-10-CM | POA: Diagnosis not present

## 2018-08-20 DIAGNOSIS — H353112 Nonexudative age-related macular degeneration, right eye, intermediate dry stage: Secondary | ICD-10-CM | POA: Diagnosis not present

## 2018-08-27 DIAGNOSIS — H938X3 Other specified disorders of ear, bilateral: Secondary | ICD-10-CM | POA: Diagnosis not present

## 2018-08-27 DIAGNOSIS — H61303 Acquired stenosis of external ear canal, unspecified, bilateral: Secondary | ICD-10-CM | POA: Diagnosis not present

## 2018-08-28 ENCOUNTER — Other Ambulatory Visit: Payer: Self-pay

## 2018-08-30 ENCOUNTER — Ambulatory Visit (INDEPENDENT_AMBULATORY_CARE_PROVIDER_SITE_OTHER): Payer: Medicare Other | Admitting: Endocrinology

## 2018-08-30 ENCOUNTER — Encounter: Payer: Self-pay | Admitting: Endocrinology

## 2018-08-30 ENCOUNTER — Other Ambulatory Visit: Payer: Self-pay

## 2018-08-30 DIAGNOSIS — E0590 Thyrotoxicosis, unspecified without thyrotoxic crisis or storm: Secondary | ICD-10-CM | POA: Diagnosis not present

## 2018-08-30 MED ORDER — METHIMAZOLE 5 MG PO TABS: 2.5000 mg | ORAL_TABLET | Freq: Every day | ORAL | 5 refills | Status: DC

## 2018-08-30 NOTE — Progress Notes (Signed)
Subjective:    Patient ID: Sabrina English, female    DOB: 10-05-1933, 85 y.o.   MRN: [id number]  HPI Pt is referred by Dr Renold Genta, for hyperthyroidism.  Pt reports he was dx'ed with hyperthyroidism in 2013.  she has never been on therapy for this.  she has never had XRT to the anterior neck, or thyroid surgery.  she has never had thyroid imaging.  she does not consume kelp or any other non-prescribed thyroid medication.  she has never been on amiodarone.  Pt states slight tremor of the hands, and assoc fatigue.  Past Medical History:  Diagnosis Date  . Cancer (HCC)    melanoma l shoulder  . GERD (gastroesophageal reflux disease)   . Hyperlipidemia   . Macular degeneration   . Meniere's disease   . Osteoporosis    osteopenia    Past Surgical History:  Procedure Laterality Date  . AXILLARY NODE DISSECTION  1983  . BUNIONECTOMY    . CATARACT Bilateral   . MELANOMA EXCISION     left shoulder    Social History   Socioeconomic History  . Marital status: Widowed    Spouse name: Not on file  . Number of children: Not on file  . Years of education: Not on file  . Highest education level: Not on file  Occupational History  . Occupation: retired  Scientific laboratory technician  . Financial resource strain: Not hard at all  . Food insecurity    Worry: Never true    Inability: Never true  . Transportation needs    Medical: No    Non-medical: No  Tobacco Use  . Smoking status: Never Smoker  . Smokeless tobacco: Never Used  Substance and Sexual Activity  . Alcohol use: No    Alcohol/week: 0.0 standard drinks  . Drug use: No  . Sexual activity: Not Currently  Lifestyle  . Physical activity    Days per week: 3 days    Minutes per session: 30 min  . Stress: Not at all  Relationships  . Social Herbalist on phone: Once a week    Gets together: Once a week    Attends religious service: 1 to 4 times per year    Active member of club or organization: Yes    Attends meetings of  clubs or organizations: 1 to 4 times per year    Relationship status: Widowed  . Intimate partner violence    Fear of current or ex partner: No    Emotionally abused: No    Physically abused: No    Forced sexual activity: No  Other Topics Concern  . Not on file  Social History Narrative  . Not on file    Current Outpatient Medications on File Prior to Visit  Medication Sig Dispense Refill  . ALPRAZolam (XANAX) 0.25 MG tablet Take 1 tablet (0.25 mg total) by mouth at bedtime as needed. for sleep 30 tablet 5  . budesonide (RHINOCORT ALLERGY) 32 MCG/ACT nasal spray Place 1 spray into both nostrils daily.    . calcium-vitamin D (OSCAL WITH D) 500-200 MG-UNIT per tablet Take 1 tablet by mouth daily.      . cyclobenzaprine (FLEXERIL) 5 MG tablet Take 1 tablet (5 mg total) by mouth 3 (three) times daily as needed for muscle spasms. 30 tablet 0  . dicyclomine (BENTYL) 20 MG tablet TAKE 1 TABLET (20 MG TOTAL) BY MOUTH EVERY 6 (SIX) HOURS. 360 tablet 1  . ipratropium (ATROVENT)  0.06 % nasal spray Use two sprays in each nostril every 6 hours if needed to dry up nose 15 mL 11  . meclizine (ANTIVERT) 25 MG tablet Take 1 tablet (25 mg total) by mouth 3 (three) times daily as needed. 30 tablet 11  . Multiple Vitamins-Minerals (PRESERVISION AREDS 2 PO) Take by mouth 2 (two) times daily.    . naproxen sodium (ANAPROX) 220 MG tablet Take 220 mg by mouth 2 (two) times daily as needed (for pain).    Marland Kitchen omeprazole (PRILOSEC) 20 MG capsule TAKE ONE CAPSULE EVERY MORNING BEFORE BREAKFAST 30 capsule 5  . simvastatin (ZOCOR) 20 MG tablet TAKE 1 TABLET BY MOUTH DAILY AT 6 PM. 90 tablet 3  . triamterene-hydrochlorothiazide (DYAZIDE) 37.5-25 MG capsule TAKE 1 CAPSULE BY MOUTH EVERY DAY 90 capsule 3  . Vitamins-Lipotropics (LIPOFLAVONOID PO) Take 1 tablet by mouth 2 (two) times daily.     No current facility-administered medications on file prior to visit.     Allergies  Allergen Reactions  . Levaquin  [Levofloxacin In D5w] Other (See Comments)    Lower extremity pain     Family History  Problem Relation Age of Onset  . Mental illness Mother   . Hyperlipidemia Mother   . Cancer Father   . Asthma Father   . Cancer Sister   . Hypothyroidism Brother     BP 130/78 (BP Location: Left Arm, Patient Position: Sitting, Cuff Size: Normal)   Pulse 66   Ht 5' 2.5" (1.588 m)   Wt 152 lb (68.9 kg)   SpO2 97%   BMI 27.36 kg/m    Review of Systems denies fever, headache, hoarseness, diplopia, palpitations, sob, diarrhea, polyuria, muscle weakness, edema, excessive diaphoresis, anxiety, and heat intolerance  She has weight gain, easy bruising, and rhinorrhea     Objective:   Physical Exam VS: see vs page GEN: no distress HEAD: head: no deformity eyes: no periorbital swelling, no proptosis external nose and ears are normal NECK: supple, thyroid is not enlarged, but the surface is slightly irreg.  No palpable nodule. CHEST WALL: no deformity LUNGS: clear to auscultation CV: reg rate and rhythm, no murmur ABD: abdomen is soft, nontender.  no hepatosplenomegaly.  not distended.  no hernia MUSCULOSKELETAL: muscle bulk and strength are grossly normal.  no obvious joint swelling.  gait is normal and steady EXTEMITIES: no deformity.  Trace bilat leg edema, and bilat vv's.  Ext: there is bilateral onychomycosis of the toenails.  PULSES: no carotid bruit NEURO:  cn 2-12 grossly intact.   readily moves all 4's.  sensation is intact to touch on all 4's  No tremor SKIN:  Normal texture and temperature.  No rash or suspicious lesion is visible.  Not diaphoretic NODES:  None palpable at the neck PSYCH: alert, well-oriented.  Does not appear anxious nor depressed.  I have reviewed outside records, and summarized: Pt was noted to have low TSH, and referred here.  She has also been followed for osteoporosis   Lab Results  Component Value Date   TSH 0.26 (L) 06/14/2018      Assessment & Plan:   Hyperthyroidism, new to me, uncertain etiology.  Usually small MNG Osteoporosis: in this setting, she should rx even a mild TSH abnormality  Patient Instructions  I have sent a prescription to your pharmacy, to slow the thyroid If ever you have fever while taking methimazole, stop it and call us, even if the reason is obvious, because of the risk of a  rare side-effect. Please come back for a follow-up appointment in 1 month. You will probably need this medication indefinitely.

## 2018-08-30 NOTE — Patient Instructions (Addendum)
I have sent a prescription to your pharmacy, to slow the thyroid If ever you have fever while taking methimazole, stop it and call us, even if the reason is obvious, because of the risk of a rare side-effect. Please come back for a follow-up appointment in 1 month. You will probably need this medication indefinitely.

## 2018-09-01 DIAGNOSIS — E0590 Thyrotoxicosis, unspecified without thyrotoxic crisis or storm: Secondary | ICD-10-CM | POA: Insufficient documentation

## 2018-09-12 ENCOUNTER — Encounter: Payer: Self-pay | Admitting: Internal Medicine

## 2018-09-12 ENCOUNTER — Ambulatory Visit (INDEPENDENT_AMBULATORY_CARE_PROVIDER_SITE_OTHER): Payer: Medicare Other | Admitting: Internal Medicine

## 2018-09-12 ENCOUNTER — Other Ambulatory Visit: Payer: Self-pay

## 2018-09-12 DIAGNOSIS — Z23 Encounter for immunization: Secondary | ICD-10-CM | POA: Diagnosis not present

## 2018-09-12 NOTE — Progress Notes (Signed)
Flu vaccine per CMA 

## 2018-09-12 NOTE — Patient Instructions (Signed)
Patient received a flu vaccine IM L deltoid, AV, CMA  

## 2018-09-25 ENCOUNTER — Other Ambulatory Visit: Payer: Self-pay

## 2018-09-27 ENCOUNTER — Other Ambulatory Visit: Payer: Self-pay

## 2018-09-27 ENCOUNTER — Encounter: Payer: Self-pay | Admitting: Endocrinology

## 2018-09-27 ENCOUNTER — Ambulatory Visit (INDEPENDENT_AMBULATORY_CARE_PROVIDER_SITE_OTHER): Payer: Medicare Other | Admitting: Endocrinology

## 2018-09-27 VITALS — BP 152/80 | HR 73 | Ht 62.5 in | Wt 150.6 lb

## 2018-09-27 DIAGNOSIS — E0590 Thyrotoxicosis, unspecified without thyrotoxic crisis or storm: Secondary | ICD-10-CM

## 2018-09-27 LAB — T4, FREE: Free T4: 0.83 ng/dL (ref 0.60–1.60)

## 2018-09-27 LAB — TSH: TSH: 1.13 u[IU]/mL (ref 0.35–4.50)

## 2018-09-27 NOTE — Patient Instructions (Signed)
Blood tests are requested for you today.  We'll let you know about the results.  If ever you have fever while taking methimazole, stop it and call us, even if the reason is obvious, because of the risk of a rare side-effect. Please come back for a follow-up appointment in 2 months.  

## 2018-09-27 NOTE — Progress Notes (Signed)
Subjective:    Patient ID: Sabrina English, female    DOB: 1933/11/10, 85 y.o.   MRN: [id number]  HPI Pt returns for f/u of mild hyperthyroidism (dx'ed 2013; she was not on rx for this, until 8/20; she has never had thyroid imaging; even mildly low TSH is rx'ed, due to sxs and osteoporosis).  Since on tapazole, pt states she feels well in general. Past Medical History:  Diagnosis Date  . Cancer (HCC)    melanoma l shoulder  . GERD (gastroesophageal reflux disease)   . Hyperlipidemia   . Macular degeneration   . Meniere's disease   . Osteoporosis    osteopenia    Past Surgical History:  Procedure Laterality Date  . AXILLARY NODE DISSECTION  1983  . BUNIONECTOMY    . CATARACT Bilateral   . MELANOMA EXCISION     left shoulder    Social History   Socioeconomic History  . Marital status: Widowed    Spouse name: Not on file  . Number of children: Not on file  . Years of education: Not on file  . Highest education level: Not on file  Occupational History  . Occupation: retired  Scientific laboratory technician  . Financial resource strain: Not hard at all  . Food insecurity    Worry: Never true    Inability: Never true  . Transportation needs    Medical: No    Non-medical: No  Tobacco Use  . Smoking status: Never Smoker  . Smokeless tobacco: Never Used  Substance and Sexual Activity  . Alcohol use: No    Alcohol/week: 0.0 standard drinks  . Drug use: No  . Sexual activity: Not Currently  Lifestyle  . Physical activity    Days per week: 3 days    Minutes per session: 30 min  . Stress: Not at all  Relationships  . Social Herbalist on phone: Once a week    Gets together: Once a week    Attends religious service: 1 to 4 times per year    Active member of club or organization: Yes    Attends meetings of clubs or organizations: 1 to 4 times per year    Relationship status: Widowed  . Intimate partner violence    Fear of current or ex partner: No    Emotionally  abused: No    Physically abused: No    Forced sexual activity: No  Other Topics Concern  . Not on file  Social History Narrative  . Not on file    Current Outpatient Medications on File Prior to Visit  Medication Sig Dispense Refill  . ALPRAZolam (XANAX) 0.25 MG tablet Take 1 tablet (0.25 mg total) by mouth at bedtime as needed. for sleep 30 tablet 5  . budesonide (RHINOCORT ALLERGY) 32 MCG/ACT nasal spray Place 1 spray into both nostrils daily.    . calcium-vitamin D (OSCAL WITH D) 500-200 MG-UNIT per tablet Take 1 tablet by mouth daily.      . cyclobenzaprine (FLEXERIL) 5 MG tablet Take 1 tablet (5 mg total) by mouth 3 (three) times daily as needed for muscle spasms. 30 tablet 0  . dicyclomine (BENTYL) 20 MG tablet TAKE 1 TABLET (20 MG TOTAL) BY MOUTH EVERY 6 (SIX) HOURS. 360 tablet 1  . ipratropium (ATROVENT) 0.06 % nasal spray Use two sprays in each nostril every 6 hours if needed to dry up nose 15 mL 11  . meclizine (ANTIVERT) 25 MG tablet Take 1 tablet (  25 mg total) by mouth 3 (three) times daily as needed. 30 tablet 11  . methimazole (TAPAZOLE) 5 MG tablet Take 0.5 tablets (2.5 mg total) by mouth daily. 15 tablet 5  . Multiple Vitamins-Minerals (PRESERVISION AREDS 2 PO) Take by mouth 2 (two) times daily.    . naproxen sodium (ANAPROX) 220 MG tablet Take 220 mg by mouth 2 (two) times daily as needed (for pain).    Marland Kitchen omeprazole (PRILOSEC) 20 MG capsule TAKE ONE CAPSULE EVERY MORNING BEFORE BREAKFAST 30 capsule 5  . simvastatin (ZOCOR) 20 MG tablet TAKE 1 TABLET BY MOUTH DAILY AT 6 PM. 90 tablet 3  . triamterene-hydrochlorothiazide (DYAZIDE) 37.5-25 MG capsule TAKE 1 CAPSULE BY MOUTH EVERY DAY 90 capsule 3  . Vitamins-Lipotropics (LIPOFLAVONOID PO) Take 1 tablet by mouth 2 (two) times daily.     No current facility-administered medications on file prior to visit.     Allergies  Allergen Reactions  . Levaquin [Levofloxacin In D5w] Other (See Comments)    Lower extremity pain      Family History  Problem Relation Age of Onset  . Mental illness Mother   . Hyperlipidemia Mother   . Cancer Father   . Asthma Father   . Cancer Sister   . Hypothyroidism Brother     BP (!) 152/80 (BP Location: Left Arm, Patient Position: Sitting, Cuff Size: Normal)   Pulse 73   Ht 5' 2.5" (1.588 m)   Wt 150 lb 9.6 oz (68.3 kg)   SpO2 96%   BMI 27.11 kg/m    Review of Systems Denies fever    Objective:   Physical Exam VITAL SIGNS:  See vs page GENERAL: no distress NECK: There is no palpable thyroid enlargement.  No thyroid nodule is palpable.  No palpable lymphadenopathy at the anterior neck.   Lab Results  Component Value Date   TSH 1.13 09/27/2018      Assessment & Plan:  Hyperthyroidism: well-controlled.  HTN: is noted today: recheck next time.  Patient Instructions  Blood tests are requested for you today.  We'll let you know about the results.  If ever you have fever while taking methimazole, stop it and call us, even if the reason is obvious, because of the risk of a rare side-effect. Please come back for a follow-up appointment in 2 months.

## 2018-09-29 ENCOUNTER — Other Ambulatory Visit: Payer: Self-pay | Admitting: Allergy and Immunology

## 2018-11-01 DIAGNOSIS — N189 Chronic kidney disease, unspecified: Secondary | ICD-10-CM | POA: Diagnosis not present

## 2018-11-01 DIAGNOSIS — D631 Anemia in chronic kidney disease: Secondary | ICD-10-CM | POA: Diagnosis not present

## 2018-11-01 DIAGNOSIS — N1832 Chronic kidney disease, stage 3b: Secondary | ICD-10-CM | POA: Diagnosis not present

## 2018-11-01 DIAGNOSIS — H8109 Meniere's disease, unspecified ear: Secondary | ICD-10-CM | POA: Diagnosis not present

## 2018-11-12 ENCOUNTER — Other Ambulatory Visit: Payer: Self-pay | Admitting: Nephrology

## 2018-11-12 DIAGNOSIS — N1832 Chronic kidney disease, stage 3b: Secondary | ICD-10-CM

## 2018-11-13 ENCOUNTER — Ambulatory Visit
Admission: RE | Admit: 2018-11-13 | Discharge: 2018-11-13 | Disposition: A | Discharge status: Home / Self Care | Payer: Medicare Other | Source: Ambulatory Visit | Attending: Nephrology | Admitting: Nephrology

## 2018-11-13 DIAGNOSIS — N1832 Chronic kidney disease, stage 3b: Secondary | ICD-10-CM

## 2018-11-13 DIAGNOSIS — N189 Chronic kidney disease, unspecified: Secondary | ICD-10-CM | POA: Diagnosis not present

## 2018-11-20 ENCOUNTER — Other Ambulatory Visit: Payer: Self-pay

## 2018-11-20 ENCOUNTER — Encounter: Payer: Self-pay | Admitting: Allergy and Immunology

## 2018-11-20 ENCOUNTER — Ambulatory Visit (INDEPENDENT_AMBULATORY_CARE_PROVIDER_SITE_OTHER): Payer: Medicare Other | Admitting: Allergy and Immunology

## 2018-11-20 VITALS — BP 132/84 | HR 72 | Temp 97.6°F | Resp 16

## 2018-11-20 DIAGNOSIS — J310 Chronic rhinitis: Secondary | ICD-10-CM | POA: Diagnosis not present

## 2018-11-20 DIAGNOSIS — J3089 Other allergic rhinitis: Secondary | ICD-10-CM

## 2018-11-20 DIAGNOSIS — K219 Gastro-esophageal reflux disease without esophagitis: Secondary | ICD-10-CM | POA: Diagnosis not present

## 2018-11-20 MED ORDER — FAMOTIDINE 20 MG PO TABS: 20.0000 mg | ORAL_TABLET | Freq: Every day | ORAL | 5 refills | Status: DC

## 2018-11-20 MED ORDER — IPRATROPIUM BROMIDE 0.06 % NA SOLN: NASAL | 5 refills | Status: DC

## 2018-11-20 NOTE — Patient Instructions (Signed)
  1. Continue to Treat and prevent inflammation:   A. OTC Rhinocort one spray each nostril 3-7 times a week.   2. Continue to Treat and prevent reflux:   A.  Continue to eliminate all caffeine consumption  B.  Famotidine 20 mg 1 time per day (replaces omeprazole)  3. If needed:    A. OTC antihistamine - Claritin/Allegra/Zyrtec  B. Nasal ipratropium 0.06% 2 sprays each nostril every 6 hours to dry up nose.  4. Return to clinic in 12 months or earlier if problem  5.  Obtain Covid vaccine when available

## 2018-11-20 NOTE — Progress Notes (Signed)
[name]   Follow-up Note  Referring Provider: Elby Showers, MD Primary Provider: Elby Showers, MD Date of Office Visit: 11/20/2018  Subjective:   Sabrina English (DOB: 1933-06-15) is a 85 y.o. female who returns to the [facility] on 11/20/2018 in re-evaluation of the following:  HPI: Sabrina English returns to this clinic in evaluation of LPR and allergic rhinitis and gustatory rhinitis.  I have not seen her in this clinic since 20 February 2018.  She is really doing well with her reflux and her throat and her nose.  She really has no complaints at all while consistently using therapy directed against reflux and a dose of nasal steroid and occasionally some nasal ipratropium.  She has not required a steroid or antibiotic for any type of airway issue.  She has seen nephrologist who would like for her to discontinue her proton pump inhibitor given the fact that she does appear to have some renal deficit.  She has been diagnosed with hyperthyroidism and will be seeing an endocrinologist.  She did receive the flu vaccine.  Allergies as of 11/20/2018      Reactions   Levaquin [levofloxacin In D5w] Other (See Comments)   Lower extremity pain       Medication List      ALPRAZolam 0.25 MG tablet Commonly known as: XANAX Take 1 tablet (0.25 mg total) by mouth at bedtime as needed. for sleep   calcium-vitamin D 500-200 MG-UNIT tablet Commonly known as: OSCAL WITH D Take 1 tablet by mouth daily.   cyclobenzaprine 5 MG tablet Commonly known as: FLEXERIL Take 1 tablet (5 mg total) by mouth 3 (three) times daily as needed for muscle spasms.   dicyclomine 20 MG tablet Commonly known as: BENTYL TAKE 1 TABLET (20 MG TOTAL) BY MOUTH EVERY 6 (SIX) HOURS.   ipratropium 0.06 % nasal spray Commonly known as: ATROVENT Use two sprays in each nostril every 6 hours if needed to dry up nose   LIPOFLAVONOID PO Take 1 tablet by  mouth 2 (two) times daily.   meclizine 25 MG tablet Commonly known as: ANTIVERT Take 1 tablet (25 mg total) by mouth 3 (three) times daily as needed.   methimazole 5 MG tablet Commonly known as: TAPAZOLE Take 0.5 tablets (2.5 mg total) by mouth daily.   naproxen sodium 220 MG tablet Commonly known as: ALEVE Take 220 mg by mouth 2 (two) times daily as needed (for pain).   omeprazole 20 MG capsule Commonly known as: PRILOSEC TAKE ONE CAPSULE EVERY MORNING BEFORE BREAKFAST   PRESERVISION AREDS 2 PO Take by mouth 2 (two) times daily.   Rhinocort Allergy 32 MCG/ACT nasal spray Generic drug: budesonide Place 1 spray into both nostrils daily.   simvastatin 20 MG tablet Commonly known as: ZOCOR TAKE 1 TABLET BY MOUTH DAILY AT 6 PM.   triamterene-hydrochlorothiazide 37.5-25 MG capsule Commonly known as: DYAZIDE TAKE 1 CAPSULE BY MOUTH EVERY DAY       Past Medical History:  Diagnosis Date  . Cancer (HCC)    melanoma l shoulder  . GERD (gastroesophageal reflux disease)   . Hyperlipidemia   . Macular degeneration   . Meniere's disease   . Osteoporosis    osteopenia    Past Surgical History:  Procedure Laterality Date  . AXILLARY NODE DISSECTION  1983  . BUNIONECTOMY    . CATARACT Bilateral   . MELANOMA EXCISION  left shoulder    Review of systems negative except as noted in HPI / PMHx or noted below:  Review of Systems  Constitutional: Negative.   HENT: Negative.   Eyes: Negative.   Respiratory: Negative.   Cardiovascular: Negative.   Gastrointestinal: Negative.   Genitourinary: Negative.   Musculoskeletal: Negative.   Skin: Negative.   Neurological: Negative.   Endo/Heme/Allergies: Negative.   Psychiatric/Behavioral: Negative.      Objective:   Vitals:   11/20/18 1345  BP: 132/84  Pulse: 72  Resp: 16  Temp: 97.6 F (36.4 C)  SpO2: 97%          Physical Exam Constitutional:      Appearance: She is not diaphoretic.  HENT:     Head:  Normocephalic.     Right Ear: Tympanic membrane, ear canal and external ear normal.     Left Ear: Tympanic membrane, ear canal and external ear normal.     Nose: Nose normal. No mucosal edema or rhinorrhea.     Mouth/Throat:     Pharynx: Uvula midline. No oropharyngeal exudate.  Eyes:     Conjunctiva/sclera: Conjunctivae normal.  Neck:     Thyroid: No thyromegaly.     Trachea: Trachea normal. No tracheal tenderness or tracheal deviation.  Cardiovascular:     Rate and Rhythm: Normal rate and regular rhythm.     Heart sounds: Normal heart sounds, S1 normal and S2 normal. No murmur.  Pulmonary:     Effort: No respiratory distress.     Breath sounds: Normal breath sounds. No stridor. No wheezing or rales.  Lymphadenopathy:     Head:     Right side of head: No tonsillar adenopathy.     Left side of head: No tonsillar adenopathy.     Cervical: No cervical adenopathy.  Skin:    Findings: No erythema or rash.     Nails: There is no clubbing.   Neurological:     Mental Status: She is alert.     Diagnostics: none  Assessment and Plan:   1. Other allergic rhinitis   2. Gustatory rhinitis   3. LPRD (laryngopharyngeal reflux disease)     1. Continue to Treat and prevent inflammation:   A. OTC Rhinocort one spray each nostril 3-7 times a week.   2. Continue to Treat and prevent reflux:   A.  Continue to eliminate all caffeine consumption  B.  Famotidine 20 mg 1 time per day (replaces omeprazole)  3. If needed:    A. OTC antihistamine - Claritin/Allegra/Zyrtec  B. Nasal ipratropium 0.06% 2 sprays each nostril every 6 hours to dry up nose.  4. Return to clinic in 12 months or earlier if problem  5.  Obtain Covid vaccine when available   Sabrina English appears to be doing very well and she can continue to treat reflux and inflammation of her airway as noted above.  We will switch her from a PPI to an H2 receptor blocker with the use of famotidine given that she does apparently  have an issue with kidney dysfunction.  I will see her back in this clinic in 1 year or earlier if there is a problem.  Sabrina Katz, MD Allergy / Immunology [facility]

## 2018-11-21 ENCOUNTER — Encounter: Payer: Self-pay | Admitting: Allergy and Immunology

## 2018-11-22 ENCOUNTER — Ambulatory Visit (INDEPENDENT_AMBULATORY_CARE_PROVIDER_SITE_OTHER): Payer: Medicare Other | Admitting: Endocrinology

## 2018-11-22 ENCOUNTER — Encounter: Payer: Self-pay | Admitting: Endocrinology

## 2018-11-22 ENCOUNTER — Other Ambulatory Visit: Payer: Self-pay

## 2018-11-22 VITALS — BP 136/70 | HR 75 | Ht 62.5 in | Wt 154.0 lb

## 2018-11-22 DIAGNOSIS — E0590 Thyrotoxicosis, unspecified without thyrotoxic crisis or storm: Secondary | ICD-10-CM | POA: Diagnosis not present

## 2018-11-22 LAB — TSH: TSH: 1.97 u[IU]/mL (ref 0.35–4.50)

## 2018-11-22 LAB — T4, FREE: Free T4: 0.86 ng/dL (ref 0.60–1.60)

## 2018-11-22 NOTE — Patient Instructions (Addendum)
Blood tests are requested for you today.  We'll let you know about the results.   °If ever you have fever while taking methimazole, stop it and call us, even if the reason is obvious, because of the risk of a rare side-effect.   °It is best to never miss the medication.  However, if you do miss it, next best is to double up the next time.   °Please come back for a follow-up appointment in 3 months.   °

## 2018-11-22 NOTE — Progress Notes (Signed)
Subjective:    Patient ID: Sabrina English, female    DOB: [birth year], 85 y.o.   MRN: [id number]  HPI Pt returns for f/u of mild hyperthyroidism (dx'ed 2013; she was not on rx for this, until 8/20; she has never had thyroid imaging; even mildly low TSH is rx'ed, due to sxs and osteoporosis).  Since on tapazole, pt states she feels well in general. Past Medical History:  Diagnosis Date  . Cancer (HCC)    melanoma l shoulder  . GERD (gastroesophageal reflux disease)   . Hyperlipidemia   . Macular degeneration   . Meniere's disease   . Osteoporosis    osteopenia    Past Surgical History:  Procedure Laterality Date  . AXILLARY NODE DISSECTION  1983  . BUNIONECTOMY    . CATARACT Bilateral   . MELANOMA EXCISION     left shoulder    Social History   Socioeconomic History  . Marital status: Widowed    Spouse name: Not on file  . Number of children: Not on file  . Years of education: Not on file  . Highest education level: Not on file  Occupational History  . Occupation: retired  Scientific laboratory technician  . Financial resource strain: Not hard at all  . Food insecurity    Worry: Never true    Inability: Never true  . Transportation needs    Medical: No    Non-medical: No  Tobacco Use  . Smoking status: Never Smoker  . Smokeless tobacco: Never Used  Substance and Sexual Activity  . Alcohol use: No    Alcohol/week: 0.0 standard drinks  . Drug use: No  . Sexual activity: Not Currently  Lifestyle  . Physical activity    Days per week: 3 days    Minutes per session: 30 min  . Stress: Not at all  Relationships  . Social Herbalist on phone: Once a week    Gets together: Once a week    Attends religious service: 1 to 4 times per year    Active member of club or organization: Yes    Attends meetings of clubs or organizations: 1 to 4 times per year    Relationship status: Widowed  . Intimate partner violence    Fear of current or ex partner: No    Emotionally  abused: No    Physically abused: No    Forced sexual activity: No  Other Topics Concern  . Not on file  Social History Narrative  . Not on file    Current Outpatient Medications on File Prior to Visit  Medication Sig Dispense Refill  . ALPRAZolam (XANAX) 0.25 MG tablet Take 1 tablet (0.25 mg total) by mouth at bedtime as needed. for sleep 30 tablet 5  . budesonide (RHINOCORT ALLERGY) 32 MCG/ACT nasal spray Place 1 spray into both nostrils daily.    . calcium-vitamin D (OSCAL WITH D) 500-200 MG-UNIT per tablet Take 1 tablet by mouth daily.      . cyclobenzaprine (FLEXERIL) 5 MG tablet Take 1 tablet (5 mg total) by mouth 3 (three) times daily as needed for muscle spasms. 30 tablet 0  . dicyclomine (BENTYL) 20 MG tablet TAKE 1 TABLET (20 MG TOTAL) BY MOUTH EVERY 6 (SIX) HOURS. 360 tablet 1  . famotidine (PEPCID) 20 MG tablet Take 1 tablet (20 mg total) by mouth daily. 30 tablet 5  . ipratropium (ATROVENT) 0.06 % nasal spray Use two sprays in each nostril every 6 hours  if needed to dry up nose 15 mL 5  . meclizine (ANTIVERT) 25 MG tablet Take 1 tablet (25 mg total) by mouth 3 (three) times daily as needed. 30 tablet 11  . methimazole (TAPAZOLE) 5 MG tablet Take 0.5 tablets (2.5 mg total) by mouth daily. 15 tablet 5  . Multiple Vitamins-Minerals (PRESERVISION AREDS 2 PO) Take by mouth 2 (two) times daily.    . simvastatin (ZOCOR) 20 MG tablet TAKE 1 TABLET BY MOUTH DAILY AT 6 PM. 90 tablet 3  . triamterene-hydrochlorothiazide (DYAZIDE) 37.5-25 MG capsule TAKE 1 CAPSULE BY MOUTH EVERY DAY 90 capsule 3  . Vitamins-Lipotropics (LIPOFLAVONOID PO) Take 1 tablet by mouth 2 (two) times daily.     No current facility-administered medications on file prior to visit.     Allergies  Allergen Reactions  . Levaquin [Levofloxacin In D5w] Other (See Comments)    Lower extremity pain     Family History  Problem Relation Age of Onset  . Mental illness Mother   . Hyperlipidemia Mother   . Cancer  Father   . Asthma Father   . Cancer Sister   . Hypothyroidism Brother   . Allergic rhinitis Neg Hx   . Angioedema Neg Hx   . Atopy Neg Hx   . Eczema Neg Hx   . Immunodeficiency Neg Hx   . Urticaria Neg Hx     BP 136/70 (BP Location: Left Arm, Patient Position: Sitting, Cuff Size: Normal)   Pulse 75   Ht 5' 2.5" (1.588 m)   Wt 154 lb (69.9 kg)   SpO2 99%   BMI 27.72 kg/m    Review of Systems Denies fever    Objective:   Physical Exam VITAL SIGNS:  See vs page GENERAL: no distress NECK: There is no palpable thyroid enlargement.  No thyroid nodule is palpable.  No palpable lymphadenopathy at the anterior neck.  Lab Results  Component Value Date   TSH 1.97 11/22/2018      Assessment & Plan:  Hyperthyroidism: well-controlled Osteoporosis: in this setting, she needs to maintain euthyroidism.   Patient Instructions  Blood tests are requested for you today.  We'll let you know about the results.  If ever you have fever while taking methimazole, stop it and call us, even if the reason is obvious, because of the risk of a rare side-effect. It is best to never miss the medication.  However, if you do miss it, next best is to double up the next time.   Please come back for a follow-up appointment in 3 months.

## 2018-12-05 ENCOUNTER — Other Ambulatory Visit: Payer: Self-pay

## 2018-12-05 MED ORDER — TRIAMTERENE-HCTZ 37.5-25 MG PO CAPS: ORAL_CAPSULE | ORAL | 3 refills | Status: DC

## 2018-12-10 DIAGNOSIS — H353113 Nonexudative age-related macular degeneration, right eye, advanced atrophic without subfoveal involvement: Secondary | ICD-10-CM | POA: Diagnosis not present

## 2018-12-10 DIAGNOSIS — H04123 Dry eye syndrome of bilateral lacrimal glands: Secondary | ICD-10-CM | POA: Diagnosis not present

## 2018-12-10 DIAGNOSIS — H524 Presbyopia: Secondary | ICD-10-CM | POA: Diagnosis not present

## 2018-12-10 DIAGNOSIS — H353122 Nonexudative age-related macular degeneration, left eye, intermediate dry stage: Secondary | ICD-10-CM | POA: Diagnosis not present

## 2019-02-19 ENCOUNTER — Other Ambulatory Visit: Payer: Self-pay | Admitting: Endocrinology

## 2019-02-26 ENCOUNTER — Other Ambulatory Visit: Payer: Self-pay

## 2019-02-27 ENCOUNTER — Encounter: Payer: Self-pay | Admitting: Endocrinology

## 2019-02-27 ENCOUNTER — Ambulatory Visit (INDEPENDENT_AMBULATORY_CARE_PROVIDER_SITE_OTHER): Payer: Medicare Other | Admitting: Endocrinology

## 2019-02-27 ENCOUNTER — Telehealth: Payer: Self-pay | Admitting: Endocrinology

## 2019-02-27 VITALS — BP 120/86 | HR 76 | Ht 62.5 in | Wt 156.0 lb

## 2019-02-27 DIAGNOSIS — E0590 Thyrotoxicosis, unspecified without thyrotoxic crisis or storm: Secondary | ICD-10-CM

## 2019-02-27 DIAGNOSIS — N189 Chronic kidney disease, unspecified: Secondary | ICD-10-CM | POA: Diagnosis not present

## 2019-02-27 DIAGNOSIS — N289 Disorder of kidney and ureter, unspecified: Secondary | ICD-10-CM | POA: Diagnosis not present

## 2019-02-27 DIAGNOSIS — D631 Anemia in chronic kidney disease: Secondary | ICD-10-CM

## 2019-02-27 LAB — CBC WITH DIFFERENTIAL/PLATELET
Basophils Absolute: 0 10*3/uL (ref 0.0–0.1)
Basophils Relative: 0.8 % (ref 0.0–3.0)
Eosinophils Absolute: 0.1 10*3/uL (ref 0.0–0.7)
Eosinophils Relative: 1.8 % (ref 0.0–5.0)
HCT: 37.4 % (ref 36.0–46.0)
Hemoglobin: 12.7 g/dL (ref 12.0–15.0)
Lymphocytes Relative: 31.3 % (ref 12.0–46.0)
Lymphs Abs: 1.7 10*3/uL (ref 0.7–4.0)
MCHC: 34 g/dL (ref 30.0–36.0)
MCV: 87.6 fl (ref 78.0–100.0)
Monocytes Absolute: 0.5 10*3/uL (ref 0.1–1.0)
Monocytes Relative: 10.1 % (ref 3.0–12.0)
Neutro Abs: 3 10*3/uL (ref 1.4–7.7)
Neutrophils Relative %: 56 % (ref 43.0–77.0)
Platelets: 273 10*3/uL (ref 150.0–400.0)
RBC: 4.27 Mil/uL (ref 3.87–5.11)
RDW: 13.7 % (ref 11.5–15.5)
WBC: 5.3 10*3/uL (ref 4.0–10.5)

## 2019-02-27 LAB — BASIC METABOLIC PANEL
BUN: 32 mg/dL — ABNORMAL HIGH (ref 6–23)
CO2: 29 mEq/L (ref 19–32)
Calcium: 10 mg/dL (ref 8.4–10.5)
Chloride: 104 mEq/L (ref 96–112)
Creatinine, Ser: 1.34 mg/dL — ABNORMAL HIGH (ref 0.40–1.20)
GFR: 37.7 mL/min — ABNORMAL LOW (ref >60.00)
Glucose, Bld: 79 mg/dL (ref 70–99)
Potassium: 3.7 mEq/L (ref 3.5–5.1)
Sodium: 139 mEq/L (ref 135–145)

## 2019-02-27 LAB — IBC PANEL
Iron: 109 ug/dL (ref 42–145)
Saturation Ratios: 34 % (ref 20.0–50.0)
Transferrin: 229 mg/dL (ref 212.0–360.0)

## 2019-02-27 LAB — T4, FREE: Free T4: 0.89 ng/dL (ref 0.60–1.60)

## 2019-02-27 LAB — TSH: TSH: 0.69 u[IU]/mL (ref 0.35–4.50)

## 2019-02-27 LAB — FERRITIN: Ferritin: 60.2 ng/mL (ref 10.0–291.0)

## 2019-02-27 NOTE — Telephone Encounter (Signed)
Patient asks to please send lab results for today's blood work to the [facility] ([place])  Ph# [phone]

## 2019-02-27 NOTE — Telephone Encounter (Signed)
I don't think the [facility] is connected to [facility] so I'm thinking the results would have to be faxed.

## 2019-02-27 NOTE — Telephone Encounter (Signed)
Are we able to cc this office to ALSO receive the results?

## 2019-02-27 NOTE — Patient Instructions (Addendum)
Blood tests are requested for you today.  We'll let you know about the results.    Please come back for a follow-up appointment in 4 months.   

## 2019-02-27 NOTE — Progress Notes (Signed)
Subjective:    Patient ID: Sabrina English, female    DOB: 12-Apr-1933, 86 y.o.   MRN: [id number]  HPI Pt returns for f/u of mild hyperthyroidism (dx'ed 2013; she was not on rx for this, until 8/20; she has never had thyroid imaging; even mildly low TSH is rx'ed, due to sxs and osteoporosis).  she feels well in general, except for cold intolerance.  Past Medical History:  Diagnosis Date  . Cancer (HCC)    melanoma l shoulder  . GERD (gastroesophageal reflux disease)   . Hyperlipidemia   . Macular degeneration   . Meniere's disease   . Osteoporosis    osteopenia    Past Surgical History:  Procedure Laterality Date  . AXILLARY NODE DISSECTION  1983  . BUNIONECTOMY    . CATARACT Bilateral   . MELANOMA EXCISION     left shoulder    Social History   Socioeconomic History  . Marital status: Widowed    Spouse name: Not on file  . Number of children: Not on file  . Years of education: Not on file  . Highest education level: Not on file  Occupational History  . Occupation: retired  Tobacco Use  . Smoking status: Never Smoker  . Smokeless tobacco: Never Used  Substance and Sexual Activity  . Alcohol use: No    Alcohol/week: 0.0 standard drinks  . Drug use: No  . Sexual activity: Not Currently  Other Topics Concern  . Not on file  Social History Narrative  . Not on file   Social Determinants of Health   Financial Resource Strain: Low Risk   . Difficulty of Paying Living Expenses: Not hard at all  Food Insecurity: No Food Insecurity  . Worried About Charity fundraiser in the Last Year: Never true  . Ran Out of Food in the Last Year: Never true  Transportation Needs: No Transportation Needs  . Lack of Transportation (Medical): No  . Lack of Transportation (Non-Medical): No  Physical Activity: Insufficiently Active  . Days of Exercise per Week: 3 days  . Minutes of Exercise per Session: 30 min  Stress: No Stress Concern Present  . Feeling of Stress : Not at all    Social Connections: Somewhat Isolated  . Frequency of Communication with Friends and Family: Once a week  . Frequency of Social Gatherings with Friends and Family: Once a week  . Attends Religious Services: 1 to 4 times per year  . Active Member of Clubs or Organizations: Yes  . Attends Archivist Meetings: 1 to 4 times per year  . Marital Status: Widowed  Intimate Partner Violence: Not At Risk  . Fear of Current or Ex-Partner: No  . Emotionally Abused: No  . Physically Abused: No  . Sexually Abused: No    Current Outpatient Medications on File Prior to Visit  Medication Sig Dispense Refill  . ALPRAZolam (XANAX) 0.25 MG tablet Take 1 tablet (0.25 mg total) by mouth at bedtime as needed. for sleep 30 tablet 5  . budesonide (RHINOCORT ALLERGY) 32 MCG/ACT nasal spray Place 1 spray into both nostrils daily.    . calcium-vitamin D (OSCAL WITH D) 500-200 MG-UNIT per tablet Take 1 tablet by mouth daily.      . cyclobenzaprine (FLEXERIL) 5 MG tablet Take 1 tablet (5 mg total) by mouth 3 (three) times daily as needed for muscle spasms. 30 tablet 0  . dicyclomine (BENTYL) 20 MG tablet TAKE 1 TABLET (20 MG TOTAL) BY  MOUTH EVERY 6 (SIX) HOURS. 360 tablet 1  . famotidine (PEPCID) 20 MG tablet Take 1 tablet (20 mg total) by mouth daily. 30 tablet 5  . ipratropium (ATROVENT) 0.06 % nasal spray Use two sprays in each nostril every 6 hours if needed to dry up nose 15 mL 5  . meclizine (ANTIVERT) 25 MG tablet Take 1 tablet (25 mg total) by mouth 3 (three) times daily as needed. 30 tablet 11  . methimazole (TAPAZOLE) 5 MG tablet TAKE 1/2 TABLET BY MOUTH DAILY 45 tablet 1  . Multiple Vitamins-Minerals (PRESERVISION AREDS 2 PO) Take by mouth 2 (two) times daily.    . simvastatin (ZOCOR) 20 MG tablet TAKE 1 TABLET BY MOUTH DAILY AT 6 PM. 90 tablet 3  . triamterene-hydrochlorothiazide (DYAZIDE) 37.5-25 MG capsule TAKE 1 CAPSULE BY MOUTH EVERY DAY 90 capsule 3  . Vitamins-Lipotropics (LIPOFLAVONOID  PO) Take 1 tablet by mouth 2 (two) times daily.     No current facility-administered medications on file prior to visit.    Allergies  Allergen Reactions  . Levaquin [Levofloxacin In D5w] Other (See Comments)    Lower extremity pain     Family History  Problem Relation Age of Onset  . Mental illness Mother   . Hyperlipidemia Mother   . Cancer Father   . Asthma Father   . Cancer Sister   . Hypothyroidism Brother   . Allergic rhinitis Neg Hx   . Angioedema Neg Hx   . Atopy Neg Hx   . Eczema Neg Hx   . Immunodeficiency Neg Hx   . Urticaria Neg Hx     BP 120/86   Pulse 76   Ht 5' 2.5" (1.588 m)   Wt 156 lb (70.8 kg)   SpO2 98%   BMI 28.08 kg/m    Review of Systems Denies fever.     Objective:   Physical Exam VITAL SIGNS:  See vs page GENERAL: no distress NECK: There is no palpable thyroid enlargement.  No thyroid nodule is palpable.  No palpable lymphadenopathy at the anterior neck.     Lab Results  Component Value Date   TSH 0.69 02/27/2019       Assessment & Plan:  Hyperthyroidism: well-controlled.  Renal insuff: recheck today.  Cold intol: check cbc today.   Patient Instructions  Blood tests are requested for you today.  We'll let you know about the results.  Please come back for a follow-up appointment in 4 months.

## 2019-02-28 NOTE — Telephone Encounter (Signed)
Called pt to inform her that we will need fax # in order to fax results per her request. LVM requesting returned call.

## 2019-02-28 NOTE — Telephone Encounter (Signed)
[facility] on [address] - [address] Carolin Sicks  Fax# [phone]

## 2019-03-01 NOTE — Telephone Encounter (Signed)
Chart Review Routing History Since 03/02/2018 [facility] Full Routing History)  Recipients Sent On Sent By Routed Reports   [facility]     03/01/2019  7:32 AM Aleatha Borer, LPN FERRITIN ([id number])       [facility]     03/01/2019  7:31 AM Aleatha Borer, LPN IBC PANEL ([id number])       [facility]     03/01/2019  7:31 AM Lucelia Lacey, LPN CBC WITH DIFFERENTIAL/PLATELET ([id number])       [facility]     03/01/2019  7:31 AM Aleatha Borer, LPN BASIC METABOLIC PANEL ([id number])       [facility]     03/01/2019  7:31 AM Plumer Mittelstaedt, LPN TSH ([id number])       [facility]     03/01/2019  7:30 AM Ravis Herne, LPN T4, FREE ([id number])       [facility]     02/28/2019  4:44 PM Renato Shin, MD T4, FREE ([id number]

## 2019-03-04 DIAGNOSIS — H61303 Acquired stenosis of external ear canal, unspecified, bilateral: Secondary | ICD-10-CM | POA: Diagnosis not present

## 2019-03-04 DIAGNOSIS — H6123 Impacted cerumen, bilateral: Secondary | ICD-10-CM | POA: Diagnosis not present

## 2019-03-04 DIAGNOSIS — Z8669 Personal history of other diseases of the nervous system and sense organs: Secondary | ICD-10-CM | POA: Diagnosis not present

## 2019-03-08 ENCOUNTER — Telehealth: Payer: Self-pay | Admitting: Internal Medicine

## 2019-03-08 MED ORDER — SIMVASTATIN 20 MG PO TABS: ORAL_TABLET | ORAL | 3 refills | Status: DC

## 2019-03-08 NOTE — Telephone Encounter (Signed)
Sabrina English  [id number]  Sabrina English called to say that the pharmacy said her doctor was no longer available, she has been trying to get refill on below medication since Wednesday. I let her know we had not seen anything from them and that I would put in request for her to get refill, she is down to 2 pills.  simvastatin (ZOCOR) 20 MG tablet  [id number] [address], [place] [address] Phone:  [phone]  Fax:  [phone]

## 2019-03-11 DIAGNOSIS — H35453 Secondary pigmentary degeneration, bilateral: Secondary | ICD-10-CM | POA: Diagnosis not present

## 2019-03-11 DIAGNOSIS — H31092 Other chorioretinal scars, left eye: Secondary | ICD-10-CM | POA: Diagnosis not present

## 2019-03-11 DIAGNOSIS — H353112 Nonexudative age-related macular degeneration, right eye, intermediate dry stage: Secondary | ICD-10-CM | POA: Diagnosis not present

## 2019-03-11 DIAGNOSIS — H35363 Drusen (degenerative) of macula, bilateral: Secondary | ICD-10-CM | POA: Diagnosis not present

## 2019-03-11 DIAGNOSIS — Z961 Presence of intraocular lens: Secondary | ICD-10-CM | POA: Diagnosis not present

## 2019-03-12 DIAGNOSIS — N1832 Chronic kidney disease, stage 3b: Secondary | ICD-10-CM | POA: Diagnosis not present

## 2019-03-12 DIAGNOSIS — D631 Anemia in chronic kidney disease: Secondary | ICD-10-CM | POA: Diagnosis not present

## 2019-03-12 DIAGNOSIS — Q6102 Congenital multiple renal cysts: Secondary | ICD-10-CM | POA: Diagnosis not present

## 2019-05-13 ENCOUNTER — Other Ambulatory Visit: Payer: Self-pay | Admitting: Allergy and Immunology

## 2019-06-13 ENCOUNTER — Other Ambulatory Visit: Payer: Self-pay

## 2019-06-13 ENCOUNTER — Other Ambulatory Visit: Payer: Medicare Other | Admitting: Internal Medicine

## 2019-06-13 DIAGNOSIS — M8580 Other specified disorders of bone density and structure, unspecified site: Secondary | ICD-10-CM

## 2019-06-13 DIAGNOSIS — Z0000 Encounter for general adult medical examination without abnormal findings: Secondary | ICD-10-CM

## 2019-06-13 DIAGNOSIS — H8109 Meniere's disease, unspecified ear: Secondary | ICD-10-CM

## 2019-06-13 DIAGNOSIS — H3530 Unspecified macular degeneration: Secondary | ICD-10-CM

## 2019-06-13 DIAGNOSIS — N183 Chronic kidney disease, stage 3 unspecified: Secondary | ICD-10-CM

## 2019-06-13 DIAGNOSIS — E0590 Thyrotoxicosis, unspecified without thyrotoxic crisis or storm: Secondary | ICD-10-CM

## 2019-06-13 DIAGNOSIS — R7989 Other specified abnormal findings of blood chemistry: Secondary | ICD-10-CM

## 2019-06-13 LAB — CBC WITH DIFFERENTIAL/PLATELET
Absolute Monocytes: 466 cells/uL (ref 200–950)
Basophils Absolute: 40 cells/uL (ref 0–200)
Basophils Relative: 0.9 %
Eosinophils Absolute: 128 cells/uL (ref 15–500)
Eosinophils Relative: 2.9 %
HCT: 39.2 % (ref 35.0–45.0)
Hemoglobin: 13 g/dL (ref 11.7–15.5)
Lymphs Abs: 1694 cells/uL (ref 850–3900)
MCH: 29.4 pg (ref 27.0–33.0)
MCHC: 33.2 g/dL (ref 32.0–36.0)
MCV: 88.7 fL (ref 80.0–100.0)
MPV: 9.8 fL (ref 7.5–12.5)
Monocytes Relative: 10.6 %
Neutro Abs: 2072 cells/uL (ref 1500–7800)
Neutrophils Relative %: 47.1 %
Platelets: 265 10*3/uL (ref 140–400)
RBC: 4.42 10*6/uL (ref 3.80–5.10)
RDW: 12.3 % (ref 11.0–15.0)
Total Lymphocyte: 38.5 %
WBC: 4.4 10*3/uL (ref 3.8–10.8)

## 2019-06-13 LAB — COMPLETE METABOLIC PANEL WITH GFR
AG Ratio: 2 (calc) (ref 1.0–2.5)
ALT: 21 U/L (ref 6–29)
AST: 24 U/L (ref 10–35)
Albumin: 4.1 g/dL (ref 3.6–5.1)
Alkaline phosphatase (APISO): 85 U/L (ref 37–153)
BUN/Creatinine Ratio: 19 (calc) (ref 6–22)
BUN: 32 mg/dL — ABNORMAL HIGH (ref 7–25)
CO2: 28 mmol/L (ref 20–32)
Calcium: 9.6 mg/dL (ref 8.6–10.4)
Chloride: 104 mmol/L (ref 98–110)
Creat: 1.71 mg/dL — ABNORMAL HIGH (ref 0.60–0.88)
GFR, Est African American: 31 mL/min/{1.73_m2} — ABNORMAL LOW (ref >=60)
GFR, Est Non African American: 27 mL/min/{1.73_m2} — ABNORMAL LOW (ref >=60)
Globulin: 2.1 g/dL (calc) (ref 1.9–3.7)
Glucose, Bld: 91 mg/dL (ref 65–99)
Potassium: 4.3 mmol/L (ref 3.5–5.3)
Sodium: 142 mmol/L (ref 135–146)
Total Bilirubin: 0.7 mg/dL (ref 0.2–1.2)
Total Protein: 6.2 g/dL (ref 6.1–8.1)

## 2019-06-13 LAB — TSH: TSH: 2.03 mIU/L (ref 0.40–4.50)

## 2019-06-13 LAB — LIPID PANEL
Cholesterol: 195 mg/dL (ref <200)
HDL: 102 mg/dL (ref >=50)
LDL Cholesterol (Calc): 79 mg/dL (calc)
Non-HDL Cholesterol (Calc): 93 mg/dL (calc) (ref <130)
Total CHOL/HDL Ratio: 1.9 (calc) (ref <5.0)
Triglycerides: 65 mg/dL (ref <150)

## 2019-06-17 ENCOUNTER — Other Ambulatory Visit: Payer: Medicare Other | Admitting: Internal Medicine

## 2019-06-20 ENCOUNTER — Ambulatory Visit (INDEPENDENT_AMBULATORY_CARE_PROVIDER_SITE_OTHER): Payer: Medicare Other | Admitting: Internal Medicine

## 2019-06-20 ENCOUNTER — Other Ambulatory Visit: Payer: Self-pay

## 2019-06-20 ENCOUNTER — Encounter: Payer: Self-pay | Admitting: Internal Medicine

## 2019-06-20 VITALS — BP 120/70 | HR 67 | Ht 63.0 in | Wt 157.0 lb

## 2019-06-20 DIAGNOSIS — E0590 Thyrotoxicosis, unspecified without thyrotoxic crisis or storm: Secondary | ICD-10-CM | POA: Diagnosis not present

## 2019-06-20 DIAGNOSIS — H3530 Unspecified macular degeneration: Secondary | ICD-10-CM

## 2019-06-20 DIAGNOSIS — Z0000 Encounter for general adult medical examination without abnormal findings: Secondary | ICD-10-CM

## 2019-06-20 DIAGNOSIS — N1831 Chronic kidney disease, stage 3a: Secondary | ICD-10-CM | POA: Diagnosis not present

## 2019-06-20 DIAGNOSIS — H903 Sensorineural hearing loss, bilateral: Secondary | ICD-10-CM

## 2019-06-20 DIAGNOSIS — Z23 Encounter for immunization: Secondary | ICD-10-CM

## 2019-06-20 DIAGNOSIS — H8109 Meniere's disease, unspecified ear: Secondary | ICD-10-CM | POA: Diagnosis not present

## 2019-06-20 DIAGNOSIS — R7989 Other specified abnormal findings of blood chemistry: Secondary | ICD-10-CM | POA: Diagnosis not present

## 2019-06-20 LAB — POCT URINALYSIS DIPSTICK
Appearance: NEGATIVE
Bilirubin, UA: NEGATIVE
Blood, UA: NEGATIVE
Glucose, UA: NEGATIVE
Ketones, UA: NEGATIVE
Leukocytes, UA: NEGATIVE
Nitrite, UA: NEGATIVE
Odor: NEGATIVE
Protein, UA: NEGATIVE
Spec Grav, UA: 1.01 (ref 1.010–1.025)
Urobilinogen, UA: 0.2 E.U./dL
pH, UA: 6.5 (ref 5.0–8.0)

## 2019-06-20 MED ORDER — CYCLOBENZAPRINE HCL 5 MG PO TABS: 5.0000 mg | ORAL_TABLET | Freq: Three times a day (TID) | ORAL | 0 refills | Status: DC | PRN

## 2019-06-24 ENCOUNTER — Other Ambulatory Visit: Payer: Self-pay

## 2019-06-24 MED ORDER — MECLIZINE HCL 25 MG PO TABS: 25.0000 mg | ORAL_TABLET | Freq: Three times a day (TID) | ORAL | 11 refills | Status: DC | PRN

## 2019-06-24 MED ORDER — CYCLOBENZAPRINE HCL 5 MG PO TABS: 5.0000 mg | ORAL_TABLET | Freq: Three times a day (TID) | ORAL | 5 refills | Status: DC | PRN

## 2019-06-24 MED ORDER — ALPRAZOLAM 0.25 MG PO TABS: 0.2500 mg | ORAL_TABLET | Freq: Every evening | ORAL | 5 refills | Status: DC | PRN

## 2019-06-25 ENCOUNTER — Other Ambulatory Visit: Payer: Self-pay | Admitting: Internal Medicine

## 2019-06-25 MED ORDER — ALPRAZOLAM 0.25 MG PO TABS: 0.2500 mg | ORAL_TABLET | Freq: Every evening | ORAL | 5 refills | Status: DC | PRN

## 2019-06-25 NOTE — Telephone Encounter (Signed)
Please refill x 6 months 

## 2019-06-25 NOTE — Telephone Encounter (Signed)
RX pended 

## 2019-06-25 NOTE — Telephone Encounter (Signed)
Received Fax RX request from  Pharmacy -  [id number] - [place], [address] Phone:  [phone]  Fax:  [phone]       Medication - ALPRAZolam Duanne Moron) 0.25 MG tablet   Last Refill -   Last OV - 06/20/19  Last CPE - 06/20/19  Next Appointment - 06/25/20

## 2019-06-26 ENCOUNTER — Ambulatory Visit (INDEPENDENT_AMBULATORY_CARE_PROVIDER_SITE_OTHER): Payer: Medicare Other | Admitting: Endocrinology

## 2019-06-26 ENCOUNTER — Encounter: Payer: Self-pay | Admitting: Endocrinology

## 2019-06-26 ENCOUNTER — Other Ambulatory Visit: Payer: Self-pay

## 2019-06-26 VITALS — BP 124/82 | HR 78 | Ht 63.0 in | Wt 161.0 lb

## 2019-06-26 DIAGNOSIS — E0590 Thyrotoxicosis, unspecified without thyrotoxic crisis or storm: Secondary | ICD-10-CM | POA: Diagnosis not present

## 2019-06-26 NOTE — Progress Notes (Signed)
Subjective:    Patient ID: Sabrina English, female    DOB: [birth year], 87 y.o.   MRN: [id number]  HPI Pt returns for f/u of mild hyperthyroidism (dx'ed 2013; she was not on rx for this, until 8/20; she has never had thyroid imaging; even mildly low TSH is rx'ed, due to sxs and osteoporosis).  she feels well in general.   Past Medical History:  Diagnosis Date  . Cancer (HCC)    melanoma l shoulder  . GERD (gastroesophageal reflux disease)   . Hyperlipidemia   . Macular degeneration   . Meniere's disease   . Osteoporosis    osteopenia    Past Surgical History:  Procedure Laterality Date  . AXILLARY NODE DISSECTION  1983  . BUNIONECTOMY    . CATARACT Bilateral   . MELANOMA EXCISION     left shoulder    Social History   Socioeconomic History  . Marital status: Widowed    Spouse name: Not on file  . Number of children: Not on file  . Years of education: Not on file  . Highest education level: Not on file  Occupational History  . Occupation: retired  Tobacco Use  . Smoking status: Never Smoker  . Smokeless tobacco: Never Used  Vaping Use  . Vaping Use: Never used  Substance and Sexual Activity  . Alcohol use: No    Alcohol/week: 0.0 standard drinks  . Drug use: No  . Sexual activity: Not Currently  Other Topics Concern  . Not on file  Social History Narrative  . Not on file   Social Determinants of Health   Financial Resource Strain:   . Difficulty of Paying Living Expenses:   Food Insecurity:   . Worried About Charity fundraiser in the Last Year:   . Arboriculturist in the Last Year:   Transportation Needs:   . Film/video editor (Medical):   Marland Kitchen Lack of Transportation (Non-Medical):   Physical Activity:   . Days of Exercise per Week:   . Minutes of Exercise per Session:   Stress:   . Feeling of Stress :   Social Connections:   . Frequency of Communication with Friends and Family:   . Frequency of Social Gatherings with Friends and Family:   .  Attends Religious Services:   . Active Member of Clubs or Organizations:   . Attends Archivist Meetings:   Marland Kitchen Marital Status:   Intimate Partner Violence:   . Fear of Current or Ex-Partner:   . Emotionally Abused:   Marland Kitchen Physically Abused:   . Sexually Abused:     Current Outpatient Medications on File Prior to Visit  Medication Sig Dispense Refill  . ALPRAZolam (XANAX) 0.25 MG tablet Take 1 tablet (0.25 mg total) by mouth at bedtime as needed. for sleep 30 tablet 5  . budesonide (RHINOCORT ALLERGY) 32 MCG/ACT nasal spray Place 1 spray into both nostrils daily.    . calcium-vitamin D (OSCAL WITH D) 500-200 MG-UNIT per tablet Take 1 tablet by mouth daily.      . cyclobenzaprine (FLEXERIL) 5 MG tablet Take 1 tablet (5 mg total) by mouth 3 (three) times daily as needed for muscle spasms. 30 tablet 5  . dicyclomine (BENTYL) 20 MG tablet TAKE 1 TABLET (20 MG TOTAL) BY MOUTH EVERY 6 (SIX) HOURS. 360 tablet 1  . famotidine (PEPCID) 20 MG tablet TAKE 1 TABLET BY MOUTH EVERY DAY 90 tablet 1  . ipratropium (ATROVENT) 0.06 %  nasal spray Use two sprays in each nostril every 6 hours if needed to dry up nose 15 mL 5  . meclizine (ANTIVERT) 25 MG tablet Take 1 tablet (25 mg total) by mouth 3 (three) times daily as needed. 30 tablet 11  . methimazole (TAPAZOLE) 5 MG tablet TAKE 1/2 TABLET BY MOUTH DAILY 45 tablet 1  . Multiple Vitamins-Minerals (PRESERVISION AREDS 2 PO) Take by mouth 2 (two) times daily.    . simvastatin (ZOCOR) 20 MG tablet TAKE 1 TABLET BY MOUTH DAILY AT 6 PM. 90 tablet 3  . triamterene-hydrochlorothiazide (DYAZIDE) 37.5-25 MG capsule TAKE 1 CAPSULE BY MOUTH EVERY DAY 90 capsule 3  . Vitamins-Lipotropics (LIPOFLAVONOID PO) Take 1 tablet by mouth 2 (two) times daily.     No current facility-administered medications on file prior to visit.    Allergies  Allergen Reactions  . Levaquin [Levofloxacin In D5w] Other (See Comments)    Lower extremity pain     Family History    Problem Relation Age of Onset  . Mental illness Mother   . Hyperlipidemia Mother   . Cancer Father   . Asthma Father   . Cancer Sister   . Hypothyroidism Brother   . Allergic rhinitis Neg Hx   . Angioedema Neg Hx   . Atopy Neg Hx   . Eczema Neg Hx   . Immunodeficiency Neg Hx   . Urticaria Neg Hx     BP 124/82 (BP Location: Left Arm, Cuff Size: Normal)   Pulse 78   Ht 5\' 3"  (1.6 m)   Wt 161 lb (73 kg)   SpO2 96%   BMI 28.52 kg/m    Review of Systems Denies fever    Objective:   Physical Exam VITAL SIGNS:  See vs page GENERAL: no distress NECK: There is no palpable thyroid enlargement.  No thyroid nodule is palpable.  No palpable lymphadenopathy at the anterior neck.   Lab Results  Component Value Date   TSH 2.03 06/13/2019       Assessment & Plan:  HTN: is noted today Hyperthyroidism: well-controlled.  Please continue the same medication. Please come back for a follow-up appointment in 6 months

## 2019-06-26 NOTE — Patient Instructions (Addendum)
Your blood pressure is high today.  Please see your primary care provider soon, to have it rechecked Blood tests are requested for you today.  We'll let you know about the results.  If ever you have fever while taking methimazole, stop it and call us, even if the reason is obvious, because of the risk of a rare side-effect. It is best to never miss the medication.  However, if you do miss it, next best is to double up the next time.  Please come back for a follow-up appointment in 4-6 months.

## 2019-07-03 NOTE — Progress Notes (Signed)
Subjective:    Patient ID: Sabrina English, female    DOB: 1933/08/07, 86 y.o.   MRN: [id number]  HPI 86 year old female in today for Medicare wellness, health maintenance exam and evaluation of medical issues.  She has a history of macular degeneration is no longer driving.  Tetanus immunization given today.  History of chronic kidney disease with elevated creatinine.  History of GE reflux treated with PPI.  History of osteopenia.  History of Mnire's disease followed at [facility].  History of hearing loss.  Sometimes has an esophageal spasm when she eats food.  No known drug allergies.  Takes Zocor for hyperlipidemia, Dyazide for Mnire's disease and as needed meclizine for vertigo with Mnire's disease.  Takes calcium and vitamin D supplements for osteopenia.  History of C7 radiculopathy currently not an issue  Left bunionectomy 1996  She had a melanoma removed from her left shoulder 1992 and subsequently received immunotherapy by Dr. Geroge Baseman at [facility].  Social history: She is a widow.  3 adult children, 2 daughters and a son.  Patient used to work at an [employer] resources [employer] but is now retired.  Does not smoke or consume alcohol.  Family history: Father died at age 61 of prostate cancer.  Mother died at age 22 of Alzheimer's disease complications.  1 sister died of colon cancer at age 21.  27 brother in good health.  1 brother in fair health.  Daughter diagnosed with pulmonary sarcoidosis.  She used to take Fosamax for osteopenia number of years ago but no longer does so.  History of bilateral sensorineural hearing loss.  Has bilateral impacted cerumen removed at [facility] due to stenosis of ear canals  History of mild anxiety.  History of hyperthyroidism seen by Dr. Loanne Drilling and treated with Tapazole    Review of Systems  Respiratory: Negative.   Cardiovascular: Negative.   Gastrointestinal: Negative.     Genitourinary: Negative.   Neurological:       History of Mnire's disease  Psychiatric/Behavioral: Negative.        Objective:   Physical Exam Blood pressure 120/70, pulse 67, pulse oximetry 96% weight 157 pounds BMI 27.81.  Skin warm and dry.  No cervical adenopathy.  No carotid bruits.  No thyromegaly.  Chest clear.  Cardiac exam regular rate and rhythm.  She has bilateral hearing aids.  Abdomen soft nondistended without hepatosplenomegaly masses or tenderness.  Bimanual exam is normal.  No lower extremity edema.  Neuro hearing loss present but no other focal deficits on brief neurological exam.  Affect thought and judgment appear to be normal.       Assessment & Plan:  History of Mnire's disease seen at [facility] and treated with Dyazide  History of sensorineural bilateral hearing loss with bilateral hearing aids  Hyperlipidemia stable on statin therapy with normal lipid panel  Chronic kidney disease-has been referred to [facility]  GE reflux treated with PPI  History of osteopenia-took Fosamax for a number of years but no longer does so  Remote history of melanoma many years ago and is cured  Macular degeneration-no longer drives  Hyperthyroidism treated with methimazole by endocrinologist  Plan: Continue current medications as previously prescribed.  Follow-up in 1 year.  Tdap vaccine given  Subjective:   Patient presents for Medicare Annual/Subsequent preventive examination.  Review Past Medical/Family/Social: See above   Risk Factors  Current exercise habits: Sedentary Dietary issues discussed: Low-fat  low carbohydrate but eat some sweets occasionally  Cardiac risk factors: Hyperlipidemia  Depression Screen  (Note: if answer to either of the following is "Yes", a more complete depression screening is indicated)   Over the past two weeks, have you felt down, depressed or hopeless? No  Over the past two weeks, have  you felt little interest or pleasure in doing things? No Have you lost interest or pleasure in daily life? No Do you often feel hopeless? No Do you cry easily over simple problems? No   Activities of Daily Living  In your present state of health, do you have any difficulty performing the following activities?:   Driving? No longer drives due to macular degeneration Managing money?  Yes my daughter helps me Feeding yourself?  Sometimes-vision is an issue Getting from bed to chair?  Do not see well and have balance issues Climbing a flight of stairs?  Yes do not see well and have balance issues Preparing food and eating?:  Yes bathing or showering?  Yes due to vision issues and balance Getting dressed: Yes a bit thin and frail and due to vision issues Getting to the toilet?  Yes due to vision issues Using the toilet: Yes Moving around from place to place: Yes vision issues from macular degeneration In the past year have you fallen or had a near fall?:No  Are you sexually active? No -I am a widow  Do you have more than one partner? No   Hearing Difficulties: Yes Do you often ask people to speak up or repeat themselves?  Yes Do you experience ringing or noises in your ears?  Yes Do you have difficulty understanding soft or whispered voices? No  Do you feel that you have a problem with memory? No Do you often misplace items? No    Home Safety:  Do you have a smoke alarm at your residence? Yes Do you have grab bars in the bathroom?  None Do you have throw rugs in your house?  None   Cognitive Testing  Alert? Yes Normal Appearance?Yes  Oriented to person? Yes Place? Yes  Time? Yes  Recall of three objects? Yes  Can perform simple calculations? Yes  Displays appropriate judgment?Yes  Can read the correct time from a watch face?Yes   List the Names of Other Physician/Practitioners you currently use:  See referral list for the physicians patient is currently seeing.  [facility]  Dr. Loanne Drilling  [facility] for Mnire's disease   Review of Systems: See above   Objective:     General appearance: Appears stated age and thin.  Head: Normocephalic, without obvious abnormality, atraumatic  Eyes: conj clear, EOMi PEERLA  Ears: normal TM's and external ear canals both ears  Nose: Nares normal. Septum midline. Mucosa normal. No drainage or sinus tenderness.  Throat: lips, mucosa, and tongue normal; teeth and gums normal  Neck: no adenopathy, no carotid bruit, no JVD, supple, symmetrical, trachea midline and thyroid not enlarged, symmetric, no tenderness/mass/nodules  No CVA tenderness.  Lungs: clear to auscultation bilaterally  Breasts: normal appearance, no masses or tenderness Heart: regular rate and rhythm, S1, S2 normal, no murmur, click, rub or gallop  Abdomen: soft, non-tender; bowel sounds normal; no masses, no organomegaly  Musculoskeletal: ROM normal in all joints, no crepitus, no deformity, Normal muscle strengthen. Back  is symmetric, no curvature. Skin: Skin color, texture, turgor normal. No rashes or lesions  Lymph nodes: Cervical, supraclavicular, and axillary nodes normal.  Neurologic: CN 2 -12 Normal, Normal symmetric reflexes. Normal coordination and gait  Psych: Alert & Oriented x 3, Mood appear stable.    Assessment:    Annual wellness medicare exam   Plan:    During the course of the visit the patient was educated and counseled about appropriate screening and preventive services including:    Tdap vaccine  Recommend annual flu vaccine  Has had Shingrix vaccine    Patient Instructions (the written plan) was given to the patient.  Medicare Attestation  I have personally reviewed:  The patient's medical and social history  Their use of alcohol, tobacco or illicit drugs  Their current medications and supplements  The patient's functional ability including ADLs,fall risks, home safety risks, cognitive,  and hearing and visual impairment  Diet and physical activities  Evidence for depression or mood disorders  The patient's weight, height, BMI, and visual acuity have been recorded in the chart. I have made referrals, counseling, and provided education to the patient based on review of the above and I have provided the patient with a written personalized care plan for preventive services.

## 2019-07-03 NOTE — Patient Instructions (Signed)
It was a pleasure to see you today.  Continue current medications and follow-up in 1 year.  Be careful with ambulation with macular degeneration.  Tetanus immunization update given today.  Have flu vaccine in the fall.

## 2019-07-05 LAB — HM MAMMOGRAPHY

## 2019-07-11 ENCOUNTER — Encounter: Payer: Self-pay | Admitting: Internal Medicine

## 2019-08-12 ENCOUNTER — Other Ambulatory Visit: Payer: Self-pay | Admitting: Endocrinology

## 2019-09-12 ENCOUNTER — Other Ambulatory Visit: Payer: Self-pay

## 2019-09-12 ENCOUNTER — Encounter: Payer: Self-pay | Admitting: Internal Medicine

## 2019-09-12 ENCOUNTER — Ambulatory Visit (INDEPENDENT_AMBULATORY_CARE_PROVIDER_SITE_OTHER): Payer: Medicare Other | Admitting: Internal Medicine

## 2019-09-12 VITALS — BP 130/90 | HR 84 | Temp 98.2°F | Ht 63.0 in | Wt 161.0 lb

## 2019-09-12 DIAGNOSIS — Z23 Encounter for immunization: Secondary | ICD-10-CM

## 2019-09-12 NOTE — Patient Instructions (Signed)
Patient received a flu vaccine, L deltoid IM.

## 2019-09-16 DIAGNOSIS — H6123 Impacted cerumen, bilateral: Secondary | ICD-10-CM | POA: Diagnosis not present

## 2019-09-23 DIAGNOSIS — H35363 Drusen (degenerative) of macula, bilateral: Secondary | ICD-10-CM | POA: Diagnosis not present

## 2019-09-23 DIAGNOSIS — H53413 Scotoma involving central area, bilateral: Secondary | ICD-10-CM | POA: Diagnosis not present

## 2019-09-23 DIAGNOSIS — Z961 Presence of intraocular lens: Secondary | ICD-10-CM | POA: Diagnosis not present

## 2019-09-23 DIAGNOSIS — H353114 Nonexudative age-related macular degeneration, right eye, advanced atrophic with subfoveal involvement: Secondary | ICD-10-CM | POA: Diagnosis not present

## 2019-09-23 DIAGNOSIS — H31092 Other chorioretinal scars, left eye: Secondary | ICD-10-CM | POA: Diagnosis not present

## 2019-09-23 DIAGNOSIS — H353124 Nonexudative age-related macular degeneration, left eye, advanced atrophic with subfoveal involvement: Secondary | ICD-10-CM | POA: Diagnosis not present

## 2019-09-28 NOTE — Progress Notes (Signed)
Flu Vaccine given by CMA 

## 2019-10-31 DIAGNOSIS — Z23 Encounter for immunization: Secondary | ICD-10-CM | POA: Diagnosis not present

## 2019-11-19 ENCOUNTER — Ambulatory Visit (INDEPENDENT_AMBULATORY_CARE_PROVIDER_SITE_OTHER): Payer: Medicare Other | Admitting: Allergy and Immunology

## 2019-11-19 ENCOUNTER — Encounter: Payer: Self-pay | Admitting: Allergy and Immunology

## 2019-11-19 ENCOUNTER — Other Ambulatory Visit: Payer: Self-pay

## 2019-11-19 VITALS — BP 112/58 | HR 66 | Temp 97.6°F | Resp 18 | Ht 63.0 in | Wt 162.6 lb

## 2019-11-19 DIAGNOSIS — K219 Gastro-esophageal reflux disease without esophagitis: Secondary | ICD-10-CM

## 2019-11-19 DIAGNOSIS — J310 Chronic rhinitis: Secondary | ICD-10-CM

## 2019-11-19 DIAGNOSIS — J3089 Other allergic rhinitis: Secondary | ICD-10-CM | POA: Diagnosis not present

## 2019-11-19 MED ORDER — IPRATROPIUM BROMIDE 0.06 % NA SOLN: NASAL | 11 refills | Status: DC

## 2019-11-19 NOTE — Progress Notes (Signed)
[place] - [place]   Follow-up Note  Referring Provider: Elby Showers, MD Primary Provider: Elby Showers, MD Date of Office Visit: 11/19/2019  Subjective:   Sabrina English (DOB: 22-Jan-1933) is a 86 y.o. female who returns [facility] [employer] on 11/19/2019 in re-evaluation of the following:  HPI: Sabrina English returns to this clinic in evaluation of LPR and allergic rhinitis and gustatory rhinitis.  Her last visit to this clinic was 72 November 2020.  She continues to have very good control of her reflux and her throat issue and her nose while consistently using famotidine to address her reflux, being very careful about caffeine and chocolate consumption, utilizing a nasal steroid a few times per week for her upper airway disease, and using nasal ipratropium as needed.  She has not required a systemic steroid or antibiotic for any type of airway issue.  She has received 3 Covid vaccinations and the flu vaccine this year.  She informs me that she is now utilizing methimazole for her hyperthyroidism followed by Dr. Loanne Drilling.  Allergies as of 11/19/2019      Reactions   Levaquin [levofloxacin In D5w] Other (See Comments)   Lower extremity pain       Medication List      ALPRAZolam 0.25 MG tablet Commonly known as: XANAX Take 1 tablet (0.25 mg total) by mouth at bedtime as needed. for sleep   calcium-vitamin D 500-200 MG-UNIT tablet Commonly known as: OSCAL WITH D Take 1 tablet by mouth daily.   cyclobenzaprine 5 MG tablet Commonly known as: FLEXERIL Take 1 tablet (5 mg total) by mouth 3 (three) times daily as needed for muscle spasms.   dicyclomine 20 MG tablet Commonly known as: BENTYL TAKE 1 TABLET (20 MG TOTAL) BY MOUTH EVERY 6 (SIX) HOURS.   famotidine 20 MG tablet Commonly known as: PEPCID TAKE 1 TABLET BY MOUTH EVERY DAY   ipratropium 0.06 % nasal spray Commonly known as: ATROVENT Use two sprays in each  nostril every 6 hours if needed to dry up nose   LIPOFLAVONOID PO Take 1 tablet by mouth 2 (two) times daily.   meclizine 25 MG tablet Commonly known as: ANTIVERT Take 1 tablet (25 mg total) by mouth 3 (three) times daily as needed.   methimazole 5 MG tablet Commonly known as: TAPAZOLE TAKE 1/2 TABLET BY MOUTH EVERY DAY   PRESERVISION AREDS 2 PO Take by mouth 2 (two) times daily.   Rhinocort Allergy 32 MCG/ACT nasal spray Generic drug: budesonide Place 1 spray into both nostrils daily.   simvastatin 20 MG tablet Commonly known as: ZOCOR TAKE 1 TABLET BY MOUTH DAILY AT 6 PM.   triamterene-hydrochlorothiazide 37.5-25 MG capsule Commonly known as: DYAZIDE TAKE 1 CAPSULE BY MOUTH EVERY DAY       Past Medical History:  Diagnosis Date  . Cancer (HCC)    melanoma l shoulder  . GERD (gastroesophageal reflux disease)   . Hyperlipidemia   . Macular degeneration   . Meniere's disease   . Osteoporosis    osteopenia    Past Surgical History:  Procedure Laterality Date  . AXILLARY NODE DISSECTION  1983  . BUNIONECTOMY    . CATARACT Bilateral   . MELANOMA EXCISION     left shoulder    Review of systems negative except as noted in HPI / PMHx or noted below:  Review of Systems  Constitutional: Negative.   HENT: Negative.  Eyes: Negative.   Respiratory: Negative.   Cardiovascular: Negative.   Gastrointestinal: Negative.   Genitourinary: Negative.   Musculoskeletal: Negative.   Skin: Negative.   Neurological: Negative.   Endo/Heme/Allergies: Negative.   Psychiatric/Behavioral: Negative.      Objective:   Vitals:   11/19/19 1335  BP: (!) 112/58  Pulse: 66  Resp: 18  Temp: 97.6 F (36.4 C)  SpO2: 97%   Height: 5\' 3"  (160 cm)  Weight: 162 lb 9.6 oz (73.8 kg)   Physical Exam Constitutional:      Appearance: She is not diaphoretic.  HENT:     Head: Normocephalic.     Right Ear: Tympanic membrane, ear canal and external ear normal.     Left Ear:  Tympanic membrane, ear canal and external ear normal.     Nose: Nose normal. No mucosal edema or rhinorrhea.     Mouth/Throat:     Pharynx: Uvula midline. No oropharyngeal exudate.  Eyes:     Conjunctiva/sclera: Conjunctivae normal.  Neck:     Thyroid: No thyromegaly.     Trachea: Trachea normal. No tracheal tenderness or tracheal deviation.  Cardiovascular:     Rate and Rhythm: Normal rate and regular rhythm.     Heart sounds: Normal heart sounds, S1 normal and S2 normal. No murmur heard.   Pulmonary:     Effort: No respiratory distress.     Breath sounds: Normal breath sounds. No stridor. No wheezing or rales.  Lymphadenopathy:     Head:     Right side of head: No tonsillar adenopathy.     Left side of head: No tonsillar adenopathy.     Cervical: No cervical adenopathy.  Skin:    Findings: No erythema or rash.     Nails: There is no clubbing.  Neurological:     Mental Status: She is alert.     Diagnostics: none  Assessment and Plan:   1. Other allergic rhinitis   2. Gustatory rhinitis   3. LPRD (laryngopharyngeal reflux disease)     1. Continue to Treat and prevent inflammation:   A. OTC Rhinocort one spray each nostril 3-7 times a week.   2. Continue to Treat and prevent reflux:   A.  Continue to eliminate all caffeine consumption  B.  Famotidine 20 mg 1 time per day   3. If needed:    A. OTC antihistamine - Claritin/Allegra/Zyrtec  B. Nasal ipratropium 0.06% 2 sprays each nostril every 6 hours to dry up nose.  4. Return to clinic in 12 months or earlier if problem  Sabrina English is doing very well on her therapy and she has a very good understanding of her disease state and how her medications work and appropriate dosing of her medications.  I have informed her today that there is really no need for her to follow-up in this clinic on a regular basis but will be happy to see her back in this clinic should there be a significant problem that develops in the  future.  She can have her medications refilled by her primary care doctor.  [name], MD Allergy / Immunology [place]

## 2019-11-19 NOTE — Patient Instructions (Signed)
  1. Continue to Treat and prevent inflammation:   A. OTC Rhinocort one spray each nostril 3-7 times a week.   2. Continue to Treat and prevent reflux:   A.  Continue to eliminate all caffeine consumption  B.  Famotidine 20 mg 1 time per day   3. If needed:    A. OTC antihistamine - Claritin/Allegra/Zyrtec  B. Nasal ipratropium 0.06% 2 sprays each nostril every 6 hours to dry up nose.  4. Return to clinic in 12 months or earlier if problem

## 2019-11-20 ENCOUNTER — Encounter: Payer: Self-pay | Admitting: Endocrinology

## 2019-11-20 ENCOUNTER — Ambulatory Visit (INDEPENDENT_AMBULATORY_CARE_PROVIDER_SITE_OTHER): Payer: Medicare Other | Admitting: Endocrinology

## 2019-11-20 ENCOUNTER — Encounter: Payer: Self-pay | Admitting: Allergy and Immunology

## 2019-11-20 VITALS — BP 120/86 | HR 69 | Ht 63.0 in | Wt 160.0 lb

## 2019-11-20 DIAGNOSIS — E0590 Thyrotoxicosis, unspecified without thyrotoxic crisis or storm: Secondary | ICD-10-CM | POA: Diagnosis not present

## 2019-11-20 LAB — TSH: TSH: 1.78 u[IU]/mL (ref 0.35–4.50)

## 2019-11-20 LAB — T4, FREE: Free T4: 0.77 ng/dL (ref 0.60–1.60)

## 2019-11-20 NOTE — Progress Notes (Signed)
Subjective:    Patient ID: Sabrina English, female    DOB: November 02, 1933, 86 y.o.   MRN: [id number]  HPI Pt returns for f/u of mild hyperthyroidism (dx'ed 2013; she was not on rx for this, until 8/20; she has never had thyroid imaging; even mildly low TSH is rx'ed, due to sxs and osteoporosis).  she feels well in general.  Specifically, she says tremor is improved, but not resolved.   Past Medical History:  Diagnosis Date  . Cancer (HCC)    melanoma l shoulder  . GERD (gastroesophageal reflux disease)   . Hyperlipidemia   . Macular degeneration   . Meniere's disease   . Osteoporosis    osteopenia    Past Surgical History:  Procedure Laterality Date  . AXILLARY NODE DISSECTION  1983  . BUNIONECTOMY    . CATARACT Bilateral   . MELANOMA EXCISION     left shoulder    Social History   Socioeconomic History  . Marital status: Widowed    Spouse name: Not on file  . Number of children: Not on file  . Years of education: Not on file  . Highest education level: Not on file  Occupational History  . Occupation: retired  Tobacco Use  . Smoking status: Never Smoker  . Smokeless tobacco: Never Used  Vaping Use  . Vaping Use: Never used  Substance and Sexual Activity  . Alcohol use: No    Alcohol/week: 0.0 standard drinks  . Drug use: No  . Sexual activity: Not Currently  Other Topics Concern  . Not on file  Social History Narrative  . Not on file   Social Determinants of Health   Financial Resource Strain:   . Difficulty of Paying Living Expenses: Not on file  Food Insecurity:   . Worried About Charity fundraiser in the Last Year: Not on file  . Ran Out of Food in the Last Year: Not on file  Transportation Needs:   . Lack of Transportation (Medical): Not on file  . Lack of Transportation (Non-Medical): Not on file  Physical Activity:   . Days of Exercise per Week: Not on file  . Minutes of Exercise per Session: Not on file  Stress:   . Feeling of Stress : Not on  file  Social Connections:   . Frequency of Communication with Friends and Family: Not on file  . Frequency of Social Gatherings with Friends and Family: Not on file  . Attends Religious Services: Not on file  . Active Member of Clubs or Organizations: Not on file  . Attends Archivist Meetings: Not on file  . Marital Status: Not on file  Intimate Partner Violence:   . Fear of Current or Ex-Partner: Not on file  . Emotionally Abused: Not on file  . Physically Abused: Not on file  . Sexually Abused: Not on file    Current Outpatient Medications on File Prior to Visit  Medication Sig Dispense Refill  . ALPRAZolam (XANAX) 0.25 MG tablet Take 1 tablet (0.25 mg total) by mouth at bedtime as needed. for sleep 30 tablet 5  . budesonide (RHINOCORT ALLERGY) 32 MCG/ACT nasal spray Place 1 spray into both nostrils daily.    . calcium-vitamin D (OSCAL WITH D) 500-200 MG-UNIT per tablet Take 1 tablet by mouth daily.      . cyclobenzaprine (FLEXERIL) 5 MG tablet Take 1 tablet (5 mg total) by mouth 3 (three) times daily as needed for muscle spasms. 30 tablet  5  . dicyclomine (BENTYL) 20 MG tablet TAKE 1 TABLET (20 MG TOTAL) BY MOUTH EVERY 6 (SIX) HOURS. 360 tablet 1  . ipratropium (ATROVENT) 0.06 % nasal spray Use two sprays in each nostril every 6 hours if needed to dry up nose 15 mL 11  . meclizine (ANTIVERT) 25 MG tablet Take 1 tablet (25 mg total) by mouth 3 (three) times daily as needed. 30 tablet 11  . methimazole (TAPAZOLE) 5 MG tablet TAKE 1/2 TABLET BY MOUTH EVERY DAY 45 tablet 1  . Multiple Vitamins-Minerals (PRESERVISION AREDS 2 PO) Take by mouth 2 (two) times daily.    . simvastatin (ZOCOR) 20 MG tablet TAKE 1 TABLET BY MOUTH DAILY AT 6 PM. 90 tablet 3  . triamterene-hydrochlorothiazide (DYAZIDE) 37.5-25 MG capsule TAKE 1 CAPSULE BY MOUTH EVERY DAY 90 capsule 3  . Vitamins-Lipotropics (LIPOFLAVONOID PO) Take 1 tablet by mouth 2 (two) times daily.     No current  facility-administered medications on file prior to visit.    Allergies  Allergen Reactions  . Levaquin [Levofloxacin In D5w] Other (See Comments)    Lower extremity pain     Family History  Problem Relation Age of Onset  . Mental illness Mother   . Hyperlipidemia Mother   . Cancer Father   . Asthma Father   . Cancer Sister   . Hypothyroidism Brother   . Allergic rhinitis Neg Hx   . Angioedema Neg Hx   . Atopy Neg Hx   . Eczema Neg Hx   . Immunodeficiency Neg Hx   . Urticaria Neg Hx     BP 120/86   Pulse 69   Ht 5\' 3"  (1.6 m)   Wt 160 lb (72.6 kg)   SpO2 99%   BMI 28.34 kg/m    Review of Systems Denies fever    Objective:   Physical Exam VITAL SIGNS:  See vs page GENERAL: no distress NECK: thyroid is slightly and diffusely enlarged  Lab Results  Component Value Date   TSH 1.78 11/20/2019       Assessment & Plan:  Hyperthyroidism: well-controlled.  Please continue the same methimazole

## 2019-11-20 NOTE — Patient Instructions (Signed)
Your blood pressure is high today.  Please see your primary care provider soon, to have it rechecked Blood tests are requested for you today.  We'll let you know about the results.  If ever you have fever while taking methimazole, stop it and call us, even if the reason is obvious, because of the risk of a rare side-effect.  It is best to never miss the medication.  However, if you do miss it, next best is to double up the next time.   Please come back for a follow-up appointment in 6 months.   

## 2019-11-21 ENCOUNTER — Ambulatory Visit: Payer: Medicare Other | Admitting: Endocrinology

## 2019-11-22 ENCOUNTER — Other Ambulatory Visit: Payer: Self-pay | Admitting: Allergy and Immunology

## 2019-12-16 DIAGNOSIS — H04123 Dry eye syndrome of bilateral lacrimal glands: Secondary | ICD-10-CM | POA: Diagnosis not present

## 2019-12-16 DIAGNOSIS — H353113 Nonexudative age-related macular degeneration, right eye, advanced atrophic without subfoveal involvement: Secondary | ICD-10-CM | POA: Diagnosis not present

## 2019-12-16 DIAGNOSIS — H43813 Vitreous degeneration, bilateral: Secondary | ICD-10-CM | POA: Diagnosis not present

## 2019-12-16 DIAGNOSIS — Z961 Presence of intraocular lens: Secondary | ICD-10-CM | POA: Diagnosis not present

## 2019-12-31 DIAGNOSIS — B079 Viral wart, unspecified: Secondary | ICD-10-CM | POA: Diagnosis not present

## 2019-12-31 DIAGNOSIS — B078 Other viral warts: Secondary | ICD-10-CM | POA: Diagnosis not present

## 2019-12-31 DIAGNOSIS — D485 Neoplasm of uncertain behavior of skin: Secondary | ICD-10-CM | POA: Diagnosis not present

## 2019-12-31 DIAGNOSIS — L570 Actinic keratosis: Secondary | ICD-10-CM | POA: Diagnosis not present

## 2020-02-06 ENCOUNTER — Other Ambulatory Visit: Payer: Self-pay | Admitting: Endocrinology

## 2020-02-08 ENCOUNTER — Other Ambulatory Visit: Payer: Self-pay | Admitting: Internal Medicine

## 2020-02-27 ENCOUNTER — Other Ambulatory Visit: Payer: Self-pay | Admitting: Internal Medicine

## 2020-03-02 DIAGNOSIS — N1832 Chronic kidney disease, stage 3b: Secondary | ICD-10-CM | POA: Diagnosis not present

## 2020-03-02 DIAGNOSIS — D509 Iron deficiency anemia, unspecified: Secondary | ICD-10-CM | POA: Diagnosis not present

## 2020-03-09 DIAGNOSIS — N1832 Chronic kidney disease, stage 3b: Secondary | ICD-10-CM | POA: Diagnosis not present

## 2020-03-09 DIAGNOSIS — H8109 Meniere's disease, unspecified ear: Secondary | ICD-10-CM | POA: Diagnosis not present

## 2020-03-09 DIAGNOSIS — Q6102 Congenital multiple renal cysts: Secondary | ICD-10-CM | POA: Diagnosis not present

## 2020-03-09 DIAGNOSIS — D631 Anemia in chronic kidney disease: Secondary | ICD-10-CM | POA: Diagnosis not present

## 2020-03-12 ENCOUNTER — Other Ambulatory Visit: Payer: Self-pay | Admitting: Nephrology

## 2020-03-12 DIAGNOSIS — N1832 Chronic kidney disease, stage 3b: Secondary | ICD-10-CM

## 2020-03-16 DIAGNOSIS — H61303 Acquired stenosis of external ear canal, unspecified, bilateral: Secondary | ICD-10-CM | POA: Diagnosis not present

## 2020-03-16 DIAGNOSIS — H6123 Impacted cerumen, bilateral: Secondary | ICD-10-CM | POA: Diagnosis not present

## 2020-03-20 ENCOUNTER — Ambulatory Visit
Admission: RE | Admit: 2020-03-20 | Discharge: 2020-03-20 | Disposition: A | Discharge status: Home / Self Care | Payer: Medicare Other | Source: Ambulatory Visit | Attending: Nephrology | Admitting: Nephrology

## 2020-03-20 DIAGNOSIS — N281 Cyst of kidney, acquired: Secondary | ICD-10-CM | POA: Diagnosis not present

## 2020-03-20 DIAGNOSIS — N1832 Chronic kidney disease, stage 3b: Secondary | ICD-10-CM

## 2020-03-23 DIAGNOSIS — H35363 Drusen (degenerative) of macula, bilateral: Secondary | ICD-10-CM | POA: Diagnosis not present

## 2020-03-23 DIAGNOSIS — H353134 Nonexudative age-related macular degeneration, bilateral, advanced atrophic with subfoveal involvement: Secondary | ICD-10-CM | POA: Diagnosis not present

## 2020-03-23 DIAGNOSIS — H43811 Vitreous degeneration, right eye: Secondary | ICD-10-CM | POA: Diagnosis not present

## 2020-03-23 DIAGNOSIS — Z961 Presence of intraocular lens: Secondary | ICD-10-CM | POA: Diagnosis not present

## 2020-03-23 DIAGNOSIS — H35453 Secondary pigmentary degeneration, bilateral: Secondary | ICD-10-CM | POA: Diagnosis not present

## 2020-05-18 ENCOUNTER — Other Ambulatory Visit: Payer: Self-pay | Admitting: Allergy and Immunology

## 2020-05-20 ENCOUNTER — Ambulatory Visit (INDEPENDENT_AMBULATORY_CARE_PROVIDER_SITE_OTHER): Payer: Medicare Other | Admitting: Endocrinology

## 2020-05-20 ENCOUNTER — Other Ambulatory Visit: Payer: Self-pay

## 2020-05-20 VITALS — BP 126/76 | HR 75 | Ht 63.0 in | Wt 161.1 lb

## 2020-05-20 DIAGNOSIS — E0590 Thyrotoxicosis, unspecified without thyrotoxic crisis or storm: Secondary | ICD-10-CM

## 2020-05-20 LAB — T4, FREE: Free T4: 0.76 ng/dL (ref 0.60–1.60)

## 2020-05-20 LAB — TSH: TSH: 1.22 u[IU]/mL (ref 0.35–4.50)

## 2020-05-20 NOTE — Patient Instructions (Addendum)
Blood tests are requested for you today.  We'll let you know about the results.  If ever you have fever while taking methimazole, stop it and call us, even if the reason is obvious, because of the risk of a rare side-effect. It is best to never miss the medication.  However, if you do miss it, next best is to double up the next time.   Please come back for a follow-up appointment in 6 months.

## 2020-05-20 NOTE — Progress Notes (Signed)
Subjective:    Patient ID: Sabrina English, female    DOB: 29-Oct-1933, 87 y.o.   MRN: [id number]  HPI Pt returns for f/u of mild hyperthyroidism (dx'ed 2013; she was not on rx for this, until 8/20; she has never had thyroid imaging; even mildly low TSH is rx'ed, due to sxs and osteoporosis).  she feels well in general, except for tremor, which is mild.    Past Medical History:  Diagnosis Date  . Cancer (HCC)    melanoma l shoulder  . GERD (gastroesophageal reflux disease)   . Hyperlipidemia   . Macular degeneration   . Meniere's disease   . Osteoporosis    osteopenia    Past Surgical History:  Procedure Laterality Date  . AXILLARY NODE DISSECTION  1983  . BUNIONECTOMY    . CATARACT Bilateral   . MELANOMA EXCISION     left shoulder    Social History   Socioeconomic History  . Marital status: Widowed    Spouse name: Not on file  . Number of children: Not on file  . Years of education: Not on file  . Highest education level: Not on file  Occupational History  . Occupation: retired  Tobacco Use  . Smoking status: Never Smoker  . Smokeless tobacco: Never Used  Vaping Use  . Vaping Use: Never used  Substance and Sexual Activity  . Alcohol use: No    Alcohol/week: 0.0 standard drinks  . Drug use: No  . Sexual activity: Not Currently  Other Topics Concern  . Not on file  Social History Narrative  . Not on file   Social Determinants of Health   Financial Resource Strain: Not on file  Food Insecurity: Not on file  Transportation Needs: Not on file  Physical Activity: Not on file  Stress: Not on file  Social Connections: Not on file  Intimate Partner Violence: Not on file    Current Outpatient Medications on File Prior to Visit  Medication Sig Dispense Refill  . ALPRAZolam (XANAX) 0.25 MG tablet Take 1 tablet (0.25 mg total) by mouth at bedtime as needed. for sleep 30 tablet 5  . budesonide (RHINOCORT AQUA) 32 MCG/ACT nasal spray Place 1 spray into both  nostrils daily.    . calcium-vitamin D (OSCAL WITH D) 500-200 MG-UNIT per tablet Take 1 tablet by mouth daily.    . cyclobenzaprine (FLEXERIL) 5 MG tablet Take 1 tablet (5 mg total) by mouth 3 (three) times daily as needed for muscle spasms. 30 tablet 5  . dicyclomine (BENTYL) 20 MG tablet TAKE 1 TABLET (20 MG TOTAL) BY MOUTH EVERY 6 (SIX) HOURS. 360 tablet 1  . famotidine (PEPCID) 20 MG tablet TAKE 1 TABLET BY MOUTH EVERY DAY 90 tablet 1  . ipratropium (ATROVENT) 0.06 % nasal spray Use two sprays in each nostril every 6 hours if needed to dry up nose 15 mL 11  . meclizine (ANTIVERT) 25 MG tablet Take 1 tablet (25 mg total) by mouth 3 (three) times daily as needed. 30 tablet 11  . methimazole (TAPAZOLE) 5 MG tablet TAKE 1/2 TABLET BY MOUTH EVERY DAY 45 tablet 1  . Multiple Vitamins-Minerals (PRESERVISION AREDS 2 PO) Take by mouth 2 (two) times daily.    . simvastatin (ZOCOR) 20 MG tablet TAKE 1 TABLET BY MOUTH DAILY AT 6 PM. 90 tablet 2  . triamterene-hydrochlorothiazide (DYAZIDE) 37.5-25 MG capsule TAKE 1 CAPSULE BY MOUTH EVERY DAY 90 capsule 2  . Vitamins-Lipotropics (LIPOFLAVONOID PO) Take 1 tablet  by mouth 2 (two) times daily.     No current facility-administered medications on file prior to visit.    Allergies  Allergen Reactions  . Levaquin [Levofloxacin In D5w] Other (See Comments)    Lower extremity pain     Family History  Problem Relation Age of Onset  . Mental illness Mother   . Hyperlipidemia Mother   . Cancer Father   . Asthma Father   . Cancer Sister   . Hypothyroidism Brother   . Allergic rhinitis Neg Hx   . Angioedema Neg Hx   . Atopy Neg Hx   . Eczema Neg Hx   . Immunodeficiency Neg Hx   . Urticaria Neg Hx     BP 126/76   Pulse 75   Ht 5\' 3"  (1.6 m)   Wt 161 lb 2 oz (73.1 kg)   SpO2 95%   BMI 28.54 kg/m    Review of Systems Denies fever    Objective:   Physical Exam VITAL SIGNS:  See vs page GENERAL: no distress NECK: thyroid is slightly and  diffusely enlarged.     Lab Results  Component Value Date   TSH 1.22 05/20/2020      Assessment & Plan:  Hyperthyroidism: well-controlled.  Please continue the same methimazole

## 2020-06-23 ENCOUNTER — Other Ambulatory Visit: Payer: Self-pay

## 2020-06-23 ENCOUNTER — Other Ambulatory Visit: Payer: Medicare Other | Admitting: Internal Medicine

## 2020-06-23 DIAGNOSIS — N1831 Chronic kidney disease, stage 3a: Secondary | ICD-10-CM | POA: Diagnosis not present

## 2020-06-23 DIAGNOSIS — Z0000 Encounter for general adult medical examination without abnormal findings: Secondary | ICD-10-CM

## 2020-06-23 DIAGNOSIS — M8580 Other specified disorders of bone density and structure, unspecified site: Secondary | ICD-10-CM | POA: Diagnosis not present

## 2020-06-23 DIAGNOSIS — H8109 Meniere's disease, unspecified ear: Secondary | ICD-10-CM

## 2020-06-23 DIAGNOSIS — E039 Hypothyroidism, unspecified: Secondary | ICD-10-CM | POA: Diagnosis not present

## 2020-06-23 DIAGNOSIS — H903 Sensorineural hearing loss, bilateral: Secondary | ICD-10-CM

## 2020-06-23 DIAGNOSIS — E0590 Thyrotoxicosis, unspecified without thyrotoxic crisis or storm: Secondary | ICD-10-CM | POA: Diagnosis not present

## 2020-06-24 LAB — CBC WITH DIFFERENTIAL/PLATELET
Absolute Monocytes: 460 cells/uL (ref 200–950)
Basophils Absolute: 32 cells/uL (ref 0–200)
Basophils Relative: 0.7 %
Eosinophils Absolute: 101 cells/uL (ref 15–500)
Eosinophils Relative: 2.2 %
HCT: 41.2 % (ref 35.0–45.0)
Hemoglobin: 13.5 g/dL (ref 11.7–15.5)
Lymphs Abs: 1730 cells/uL (ref 850–3900)
MCH: 29 pg (ref 27.0–33.0)
MCHC: 32.8 g/dL (ref 32.0–36.0)
MCV: 88.4 fL (ref 80.0–100.0)
MPV: 9.7 fL (ref 7.5–12.5)
Monocytes Relative: 10 %
Neutro Abs: 2277 cells/uL (ref 1500–7800)
Neutrophils Relative %: 49.5 %
Platelets: 298 10*3/uL (ref 140–400)
RBC: 4.66 10*6/uL (ref 3.80–5.10)
RDW: 12.1 % (ref 11.0–15.0)
Total Lymphocyte: 37.6 %
WBC: 4.6 10*3/uL (ref 3.8–10.8)

## 2020-06-24 LAB — COMPLETE METABOLIC PANEL WITH GFR
AG Ratio: 1.9 (calc) (ref 1.0–2.5)
ALT: 23 U/L (ref 6–29)
AST: 26 U/L (ref 10–35)
Albumin: 4.1 g/dL (ref 3.6–5.1)
Alkaline phosphatase (APISO): 94 U/L (ref 37–153)
BUN/Creatinine Ratio: 19 (calc) (ref 6–22)
BUN: 26 mg/dL — ABNORMAL HIGH (ref 7–25)
CO2: 26 mmol/L (ref 20–32)
Calcium: 9.9 mg/dL (ref 8.6–10.4)
Chloride: 107 mmol/L (ref 98–110)
Creat: 1.38 mg/dL — ABNORMAL HIGH (ref 0.60–0.88)
GFR, Est African American: 40 mL/min/{1.73_m2} — ABNORMAL LOW (ref >=60)
GFR, Est Non African American: 35 mL/min/{1.73_m2} — ABNORMAL LOW (ref >=60)
Globulin: 2.2 g/dL (calc) (ref 1.9–3.7)
Glucose, Bld: 92 mg/dL (ref 65–99)
Potassium: 4 mmol/L (ref 3.5–5.3)
Sodium: 143 mmol/L (ref 135–146)
Total Bilirubin: 0.7 mg/dL (ref 0.2–1.2)
Total Protein: 6.3 g/dL (ref 6.1–8.1)

## 2020-06-24 LAB — LIPID PANEL
Cholesterol: 181 mg/dL (ref <200)
HDL: 79 mg/dL (ref >=50)
LDL Cholesterol (Calc): 83 mg/dL (calc)
Non-HDL Cholesterol (Calc): 102 mg/dL (calc) (ref <130)
Total CHOL/HDL Ratio: 2.3 (calc) (ref <5.0)
Triglycerides: 91 mg/dL (ref <150)

## 2020-06-24 LAB — TSH: TSH: 1.7 mIU/L (ref 0.40–4.50)

## 2020-06-25 ENCOUNTER — Ambulatory Visit (INDEPENDENT_AMBULATORY_CARE_PROVIDER_SITE_OTHER): Payer: Medicare Other | Admitting: Internal Medicine

## 2020-06-25 ENCOUNTER — Other Ambulatory Visit: Payer: Self-pay

## 2020-06-25 VITALS — BP 120/80 | HR 78 | Ht 61.0 in | Wt 156.0 lb

## 2020-06-25 DIAGNOSIS — H903 Sensorineural hearing loss, bilateral: Secondary | ICD-10-CM

## 2020-06-25 DIAGNOSIS — Z8719 Personal history of other diseases of the digestive system: Secondary | ICD-10-CM | POA: Diagnosis not present

## 2020-06-25 DIAGNOSIS — N1831 Chronic kidney disease, stage 3a: Secondary | ICD-10-CM | POA: Diagnosis not present

## 2020-06-25 DIAGNOSIS — Z85820 Personal history of malignant melanoma of skin: Secondary | ICD-10-CM | POA: Diagnosis not present

## 2020-06-25 DIAGNOSIS — E0590 Thyrotoxicosis, unspecified without thyrotoxic crisis or storm: Secondary | ICD-10-CM

## 2020-06-25 DIAGNOSIS — H8109 Meniere's disease, unspecified ear: Secondary | ICD-10-CM

## 2020-06-25 DIAGNOSIS — M8580 Other specified disorders of bone density and structure, unspecified site: Secondary | ICD-10-CM

## 2020-06-25 DIAGNOSIS — Z8659 Personal history of other mental and behavioral disorders: Secondary | ICD-10-CM

## 2020-06-25 DIAGNOSIS — H3530 Unspecified macular degeneration: Secondary | ICD-10-CM | POA: Diagnosis not present

## 2020-06-25 DIAGNOSIS — Z0000 Encounter for general adult medical examination without abnormal findings: Secondary | ICD-10-CM

## 2020-06-25 DIAGNOSIS — J302 Other seasonal allergic rhinitis: Secondary | ICD-10-CM | POA: Diagnosis not present

## 2020-06-25 DIAGNOSIS — K219 Gastro-esophageal reflux disease without esophagitis: Secondary | ICD-10-CM

## 2020-06-25 LAB — POCT URINALYSIS DIPSTICK
Appearance: NEGATIVE
Bilirubin, UA: NEGATIVE
Blood, UA: NEGATIVE
Glucose, UA: NEGATIVE
Ketones, UA: NEGATIVE
Leukocytes, UA: NEGATIVE
Nitrite, UA: NEGATIVE
Odor: NEGATIVE
Protein, UA: NEGATIVE
Spec Grav, UA: 1.015 (ref 1.010–1.025)
Urobilinogen, UA: 0.2 E.U./dL
pH, UA: 6.5 (ref 5.0–8.0)

## 2020-06-25 MED ORDER — ALPRAZOLAM 0.25 MG PO TABS: 0.2500 mg | ORAL_TABLET | Freq: Every evening | ORAL | 1 refills | Status: DC | PRN

## 2020-06-25 MED ORDER — METHYLPREDNISOLONE ACETATE 80 MG/ML IJ SUSP
80.0000 mg | Freq: Once | INTRAMUSCULAR | Status: AC
  Administered 2020-06-25: 80 mg via INTRAMUSCULAR

## 2020-06-25 MED ORDER — MECLIZINE HCL 25 MG PO TABS: 25.0000 mg | ORAL_TABLET | Freq: Three times a day (TID) | ORAL | 11 refills | Status: DC | PRN

## 2020-06-25 NOTE — Progress Notes (Addendum)
Subjective:    Patient ID: Sabrina English, female    DOB: Oct 09, 1933, 87 y.o.   MRN: [id number]  HPI 87 year old Female seen for Medicare wellness, health maintenance exam, and evaluation of medical issues.  Hx chronic kidney disease and is on iron supplement. Chronic kidney disease stage 3b and stable on yearly follow up.   Hx hyperthyroidism on methimazole per Dr. Tivis Ringer driving due to macular degeneration.  Had tetanus immunization in 2021.  History of GE reflux treated with PPI.  History of osteopenia.  History of chronic kidney disease with elevated creatinine.  History of Mnire's disease followed at [facility].  History of hearing loss.  Sometimes has esophageal spasm when she is eating food.  Takes Zocor for hyperlipidemia.  Takes Dyazide for Mnire's disease which is longstanding.  Has meclizine on hand for vertigo if needed with Mnire's disease.  Takes calcium and vitamin D supplements for osteopenia.  Remote history of C7 radiculopathy currently not an issue.  Left bunionectomy 1996.  She had melanoma removed from her left shoulder 1992 and received immunotherapy by Dr. Geroge Baseman at [facility].  Social history: She is a widow.  3 adult children, 2 daughters and 1 son.  She is retired and previously worked as an Human resources officer.  Does not smoke or consume alcohol.  She used to take Fosamax for osteopenia number of years ago but no longer does so.  History of bilateral sensorineural hearing loss.  Has bilateral impacted cerumen removed at [facility] due to stenosis of ear canals.  History of mild anxiety.  History of hyperthyroidism seen by Dr. Loanne Drilling and treated with Tapazole.  Family history: Father died at age 78 of prostate cancer.  Mother died at age 37 of Alzheimer's disease complications.  1 sister died of colon cancer at age 89.  96 brother in good health.  1 brother in fair health.  Daughter has  been diagnosed with pulmonary sarcoidosis Review of Systems See above-no new complaints    Objective:   Physical Exam Blood pressure 120/80 pulse 78 regular pulse oximetry 95% weight 156 pounds height 5 feet 1 inches BMI 29.48  Skin: Warm and dry.  No cervical adenopathy.  No carotid bruits.  No thyromegaly appreciated.  Chest is clear to auscultation.  Breasts are without masses.  Cardiac exam: Regular rate and rhythm normal S1 and S2 without murmurs.  Abdomen soft nondistended without hepatosplenomegaly masses or tenderness.  No lower extremity pitting edema.  Neuro is intact without focal deficits.  Affect thought and judgment are normal.       Assessment & Plan:  Longstanding history of Mnire's disease followed at [facility] and stable.  Has meclizine to take if needed  Remote history of melanoma left shoulder 1992 with no subsequent recurrence or other lesions  Left bunionectomy 1996  Remote history of C7 radiculopathy-currently not an issue  Osteopenia-takes calcium and vitamin D supplements  Hyperlipidemia treated with Zocor-lipid panel normal  History of mild anxiety-has Xanax to take if needed  Hyperthyroidism treated with Tapazole by Dr. Loanne Drilling  History of bilateral sensorineural hearing loss  GE reflux treated with Pepcid   History of macular degeneration and no longer driving  History of chronic kidney disease stage IIIa-stable with creatinine 1.38  Plan: Continue current medications and return in 1 year.   Subjective:   Patient presents for Medicare Annual/Subsequent preventive examination.  Review Past Medical/Family/Social:  See above   Risk Factors  Current exercise habits: Not a lot of exercise but active Dietary issues discussed: Low-fat low carbohydrate  Cardiac risk factors:  Depression Screen  (Note: if answer to either of the following is "Yes", a more complete depression screening is indicated)   Over the past  two weeks, have you felt down, depressed or hopeless? No  Over the past two weeks, have you felt little interest or pleasure in doing things? No Have you lost interest or pleasure in daily life? No Do you often feel hopeless? No Do you cry easily over simple problems? No   Activities of Daily Living  In your present state of health, do you have any difficulty performing the following activities?:   Driving? No longer drives Managing money? No  Feeding yourself? No  Getting from bed to chair? No  Climbing a flight of stairs? No  Preparing food and eating?: No  Bathing or showering? No  Getting dressed: No  Getting to the toilet? No  Using the toilet:No  Moving around from place to place: No  In the past year have you fallen or had a near fall?:No  Are you sexually active? No  Do you have more than one partner? No   Hearing Difficulties: No  Do you often ask people to speak up or repeat themselves?  Yes Do you experience ringing or noises in your ears?  Is Do you have difficulty understanding soft or whispered voices?  Yes Do you feel that you have a problem with memory? No Do you often misplace items? No    Home Safety:  Do you have a smoke alarm at your residence?  None Do you have grab bars in the bathroom?  No Do you have throw rugs in your house?  No do   Cognitive Testing  Alert? Yes Normal Appearance?Yes  Oriented to person? Yes Place? Yes  Time? Yes  Recall of three objects? Yes  Can perform simple calculations? Yes  Displays appropriate judgment?Yes  Can read the correct time from a watch face?Yes   List the Names of Other Physician/Practitioners you currently use:  See referral list for the physicians patient is currently seeing.  Dr. Ellison-endocrinologist   Review of Systems: See above   Objective:     General appearance: Appears stated age  Head: Normocephalic, without obvious abnormality, atraumatic  Eyes: conj clear, EOMi PEERLA  Ears: normal  TM's and external ear canals both ears  Nose: Nares normal. Septum midline. Mucosa normal. No drainage or sinus tenderness.  Throat: lips, mucosa, and tongue normal; teeth and gums normal  Neck: no adenopathy, no carotid bruit, no JVD, supple, symmetrical, trachea midline and thyroid not enlarged, symmetric, no tenderness/mass/nodules  No CVA tenderness.  Lungs: clear to auscultation bilaterally  Breasts: normal appearance, no masses or tenderness Heart: regular rate and rhythm, S1, S2 normal, no murmur, click, rub or gallop  Abdomen: soft, non-tender; bowel sounds normal; no masses, no organomegaly  Musculoskeletal: ROM normal in all joints, no crepitus, no deformity, Normal muscle strengthen. Back  is symmetric, no curvature. Skin: Skin color, texture, turgor normal. No rashes or lesions  Lymph nodes: Cervical, supraclavicular, and axillary nodes normal.  Neurologic: CN 2 -12 Normal, Normal symmetric reflexes. Normal coordination and gait  Psych: Alert & Oriented x 3, Mood appear stable.    Assessment:    Annual wellness medicare exam   Plan:    During the course of the visit the patient was educated  and counseled about appropriate screening and preventive services including:   Immunizations discussed     Patient Instructions (the written plan) was given to the patient.  Medicare Attestation  I have personally reviewed:  The patient's medical and social history  Their use of alcohol, tobacco or illicit drugs  Their current medications and supplements  The patient's functional ability including ADLs,fall risks, home safety risks, cognitive, and hearing and visual impairment  Diet and physical activities  Evidence for depression or mood disorders  The patient's weight, height, BMI, and visual acuity have been recorded in the chart. I have made referrals, counseling, and provided education to the patient based on review of the above and I have provided the patient with a written  personalized care plan for preventive services.

## 2020-07-01 ENCOUNTER — Other Ambulatory Visit: Payer: Self-pay

## 2020-07-01 DIAGNOSIS — E0590 Thyrotoxicosis, unspecified without thyrotoxic crisis or storm: Secondary | ICD-10-CM

## 2020-07-01 DIAGNOSIS — N1831 Chronic kidney disease, stage 3a: Secondary | ICD-10-CM

## 2020-07-01 DIAGNOSIS — M8580 Other specified disorders of bone density and structure, unspecified site: Secondary | ICD-10-CM

## 2020-07-10 ENCOUNTER — Encounter: Payer: Self-pay | Admitting: Internal Medicine

## 2020-07-10 DIAGNOSIS — Z1231 Encounter for screening mammogram for malignant neoplasm of breast: Secondary | ICD-10-CM | POA: Diagnosis not present

## 2020-07-10 DIAGNOSIS — M8589 Other specified disorders of bone density and structure, multiple sites: Secondary | ICD-10-CM | POA: Diagnosis not present

## 2020-07-10 DIAGNOSIS — Z780 Asymptomatic menopausal state: Secondary | ICD-10-CM | POA: Diagnosis not present

## 2020-07-10 LAB — HM MAMMOGRAPHY

## 2020-07-14 ENCOUNTER — Encounter: Payer: Self-pay | Admitting: Internal Medicine

## 2020-07-17 ENCOUNTER — Encounter: Payer: Self-pay | Admitting: Internal Medicine

## 2020-07-22 ENCOUNTER — Encounter: Payer: Self-pay | Admitting: Internal Medicine

## 2020-08-01 ENCOUNTER — Encounter: Payer: Self-pay | Admitting: Internal Medicine

## 2020-08-01 NOTE — Patient Instructions (Addendum)
It was a pleasure to see you today.  Continue current medications and follow-up in 1 year or as needed.  Have COVID booster in the Fall.

## 2020-08-07 ENCOUNTER — Other Ambulatory Visit: Payer: Self-pay | Admitting: Endocrinology

## 2020-09-21 DIAGNOSIS — H35453 Secondary pigmentary degeneration, bilateral: Secondary | ICD-10-CM | POA: Diagnosis not present

## 2020-09-21 DIAGNOSIS — H3562 Retinal hemorrhage, left eye: Secondary | ICD-10-CM | POA: Diagnosis not present

## 2020-09-21 DIAGNOSIS — H35363 Drusen (degenerative) of macula, bilateral: Secondary | ICD-10-CM | POA: Diagnosis not present

## 2020-09-21 DIAGNOSIS — H53413 Scotoma involving central area, bilateral: Secondary | ICD-10-CM | POA: Diagnosis not present

## 2020-09-21 DIAGNOSIS — Z961 Presence of intraocular lens: Secondary | ICD-10-CM | POA: Diagnosis not present

## 2020-09-21 DIAGNOSIS — H43811 Vitreous degeneration, right eye: Secondary | ICD-10-CM | POA: Diagnosis not present

## 2020-09-21 DIAGNOSIS — H353134 Nonexudative age-related macular degeneration, bilateral, advanced atrophic with subfoveal involvement: Secondary | ICD-10-CM | POA: Diagnosis not present

## 2020-09-25 DIAGNOSIS — Z23 Encounter for immunization: Secondary | ICD-10-CM | POA: Diagnosis not present

## 2020-09-28 DIAGNOSIS — Q161 Congenital absence, atresia and stricture of auditory canal (external): Secondary | ICD-10-CM | POA: Diagnosis not present

## 2020-09-28 DIAGNOSIS — H8109 Meniere's disease, unspecified ear: Secondary | ICD-10-CM | POA: Diagnosis not present

## 2020-09-28 DIAGNOSIS — H40013 Open angle with borderline findings, low risk, bilateral: Secondary | ICD-10-CM | POA: Diagnosis not present

## 2020-09-28 DIAGNOSIS — Z48810 Encounter for surgical aftercare following surgery on the sense organs: Secondary | ICD-10-CM | POA: Diagnosis not present

## 2020-09-28 DIAGNOSIS — H9193 Unspecified hearing loss, bilateral: Secondary | ICD-10-CM | POA: Diagnosis not present

## 2020-11-03 ENCOUNTER — Other Ambulatory Visit: Payer: Self-pay | Admitting: Internal Medicine

## 2020-11-15 ENCOUNTER — Telehealth: Payer: Self-pay | Admitting: Allergy and Immunology

## 2020-11-18 DIAGNOSIS — I788 Other diseases of capillaries: Secondary | ICD-10-CM | POA: Diagnosis not present

## 2020-11-18 DIAGNOSIS — D485 Neoplasm of uncertain behavior of skin: Secondary | ICD-10-CM | POA: Diagnosis not present

## 2020-11-18 DIAGNOSIS — C44519 Basal cell carcinoma of skin of other part of trunk: Secondary | ICD-10-CM | POA: Diagnosis not present

## 2020-11-18 DIAGNOSIS — L821 Other seborrheic keratosis: Secondary | ICD-10-CM | POA: Diagnosis not present

## 2020-11-19 ENCOUNTER — Other Ambulatory Visit: Payer: Self-pay | Admitting: Internal Medicine

## 2020-11-19 NOTE — Telephone Encounter (Signed)
Please read an excerpt from my very last note: I have informed her today that there is really no need for her to follow-up in this clinic on a regular basis but will be happy to see her back in this clinic should there be a significant problem that develops in the future.  She can have her medications refilled by her primary care doctor.  For legal reasons someone needs to see her once a year to refill her medicines.  It can be me or her primary care doctor.

## 2020-11-19 NOTE — Telephone Encounter (Signed)
Patient called wanting to know why her medication was denied if Dr Neldon Mc told her she didn't need to return back to our office & that he would just send in her refills when needed.   I informed the patient that her office note has return in 12 months from her last visit.   She requested I send a message to Dr Neldon Mc to confirm.  Please Advise

## 2020-11-20 ENCOUNTER — Telehealth: Payer: Self-pay | Admitting: Internal Medicine

## 2020-11-20 MED ORDER — FAMOTIDINE 20 MG PO TABS: 20.0000 mg | ORAL_TABLET | Freq: Every day | ORAL | 3 refills | Status: DC

## 2020-11-20 NOTE — Telephone Encounter (Signed)
Refilled #[phone] with 3 refills

## 2020-11-20 NOTE — Telephone Encounter (Signed)
Shenelle Klas [place] [phone]  Eola Alice called to see if you would take over below medication, she has been getting it from Dr Neldon Mc, and she is not going t be seeing him anymore, so he told her to ask and see if you could take it over. She only has about 5 more pills   famotidine (PEPCID) 20 MG tablet  [id number] [place], [place] - [address] Phone:  [phone]  Fax:  [phone]

## 2020-11-30 ENCOUNTER — Other Ambulatory Visit: Payer: Self-pay

## 2020-11-30 ENCOUNTER — Ambulatory Visit (INDEPENDENT_AMBULATORY_CARE_PROVIDER_SITE_OTHER): Payer: Medicare Other | Admitting: Endocrinology

## 2020-11-30 VITALS — BP 142/78 | HR 71 | Ht 61.0 in | Wt 162.2 lb

## 2020-11-30 DIAGNOSIS — E0590 Thyrotoxicosis, unspecified without thyrotoxic crisis or storm: Secondary | ICD-10-CM

## 2020-11-30 LAB — TSH: TSH: 1.23 u[IU]/mL (ref 0.35–5.50)

## 2020-11-30 LAB — T4, FREE: Free T4: 0.87 ng/dL (ref 0.60–1.60)

## 2020-11-30 NOTE — Patient Instructions (Signed)
Blood tests are requested for you today.  We'll let you know about the results.   If ever you have fever while taking methimazole, stop it and call us, even if the reason is obvious, because of the risk of a rare side-effect.  It is best to never miss the medication.  However, if you do miss it, next best is to double up the next time.   Please come back for a follow-up appointment in 6 months.

## 2020-11-30 NOTE — Progress Notes (Signed)
Subjective:    Patient ID: Sabrina English, female    DOB: Aug 25, 1933, 87 y.o.   MRN: [id number]  HPI Pt returns for f/u of mild hyperthyroidism (dx'ed 2013; she was not on rx for this, until 8/20; she has never had thyroid imaging; even mildly low TSH is rx'ed, due to sxs and osteoporosis).  she feels well in general, except for weight gain.    Past Medical History:  Diagnosis Date   Cancer ([place])    melanoma l shoulder   GERD (gastroesophageal reflux disease)    Hyperlipidemia    Macular degeneration    Meniere's disease    Osteoporosis    osteopenia    Past Surgical History:  Procedure Laterality Date   AXILLARY NODE DISSECTION  1983   BUNIONECTOMY     CATARACT Bilateral    MELANOMA EXCISION     left shoulder    Social History   Socioeconomic History   Marital status: Widowed    Spouse name: Not on file   Number of children: Not on file   Years of education: Not on file   Highest education level: Not on file  Occupational History   Occupation: retired  Tobacco Use   Smoking status: Never   Smokeless tobacco: Never  Vaping Use   Vaping Use: Never used  Substance and Sexual Activity   Alcohol use: No    Alcohol/week: 0.0 standard drinks   Drug use: No   Sexual activity: Not Currently  Other Topics Concern   Not on file  Social History Narrative   Not on file   Social Determinants of Health   Financial Resource Strain: Not on file  Food Insecurity: Not on file  Transportation Needs: Not on file  Physical Activity: Not on file  Stress: Not on file  Social Connections: Not on file  Intimate Partner Violence: Not on file    Current Outpatient Medications on File Prior to Visit  Medication Sig Dispense Refill   ALPRAZolam (XANAX) 0.25 MG tablet Take 1 tablet (0.25 mg total) by mouth at bedtime as needed. for sleep 90 tablet 1   budesonide (RHINOCORT AQUA) 32 MCG/ACT nasal spray Place 1 spray into both nostrils daily.     calcium-vitamin D (OSCAL WITH  D) 500-200 MG-UNIT per tablet Take 1 tablet by mouth daily.     cyclobenzaprine (FLEXERIL) 5 MG tablet Take 1 tablet (5 mg total) by mouth 3 (three) times daily as needed for muscle spasms. 30 tablet 5   dicyclomine (BENTYL) 20 MG tablet TAKE 1 TABLET (20 MG TOTAL) BY MOUTH EVERY 6 (SIX) HOURS. 360 tablet 1   famotidine (PEPCID) 20 MG tablet Take 1 tablet (20 mg total) by mouth daily. 90 tablet 3   ipratropium (ATROVENT) 0.06 % nasal spray Use two sprays in each nostril every 6 hours if needed to dry up nose 15 mL 11   meclizine (ANTIVERT) 25 MG tablet Take 1 tablet (25 mg total) by mouth 3 (three) times daily as needed. 30 tablet 11   methimazole (TAPAZOLE) 5 MG tablet TAKE 1/2 TABLET BY MOUTH EVERY DAY 45 tablet 1   Multiple Vitamins-Minerals (PRESERVISION AREDS 2 PO) Take by mouth 2 (two) times daily.     simvastatin (ZOCOR) 20 MG tablet TAKE 1 TABLET BY MOUTH DAILY AT 6 PM. 90 tablet 2   triamterene-hydrochlorothiazide (DYAZIDE) 37.5-25 MG capsule TAKE 1 CAPSULE BY MOUTH EVERY DAY 90 capsule 2   Vitamins-Lipotropics (LIPOFLAVONOID PO) Take 1 tablet by mouth  2 (two) times daily.     No current facility-administered medications on file prior to visit.    Allergies  Allergen Reactions   Levaquin [Levofloxacin In D5w] Other (See Comments)    Lower extremity pain     Family History  Problem Relation Age of Onset   Mental illness Mother    Hyperlipidemia Mother    Cancer Father    Asthma Father    Cancer Sister    Hypothyroidism Brother    Allergic rhinitis Neg Hx    Angioedema Neg Hx    Atopy Neg Hx    Eczema Neg Hx    Immunodeficiency Neg Hx    Urticaria Neg Hx     BP (!) 142/78 (BP Location: Left Arm, Patient Position: Sitting, Cuff Size: Normal)   Pulse 71   Ht 5\' 1"  (1.549 m)   Wt 162 lb 3.2 oz (73.6 kg)   SpO2 97%   BMI 30.65 kg/m    Review of Systems Denies fever    Objective:   Physical Exam VITAL SIGNS:  See vs page GENERAL: no distress NECK: There is no  palpable thyroid enlargement.  No thyroid nodule is palpable.  No palpable lymphadenopathy at the anterior neck.   Lab Results  Component Value Date   TSH 1.23 11/30/2020      Assessment & Plan:  Hyperthyroidism: well-controlled.  Please continue the same methimazole.

## 2020-12-21 DIAGNOSIS — H353113 Nonexudative age-related macular degeneration, right eye, advanced atrophic without subfoveal involvement: Secondary | ICD-10-CM | POA: Diagnosis not present

## 2020-12-21 DIAGNOSIS — H401131 Primary open-angle glaucoma, bilateral, mild stage: Secondary | ICD-10-CM | POA: Diagnosis not present

## 2021-01-08 IMAGING — US US RENAL
1 series · 14 of 25 positions shown · non-contrast
Comparison: None.

CLINICAL DATA: 85-year-old female with chronic kidney disease.

EXAM:
RENAL / URINARY TRACT ULTRASOUND COMPLETE

[Series 1: us renal · 0.23mm/px · 14 of 54 slices shown]
[im 1/54]
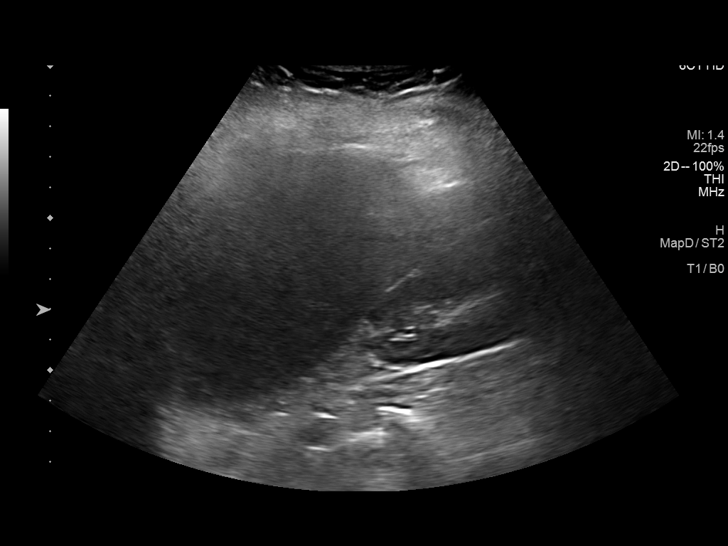
[im 5/54]
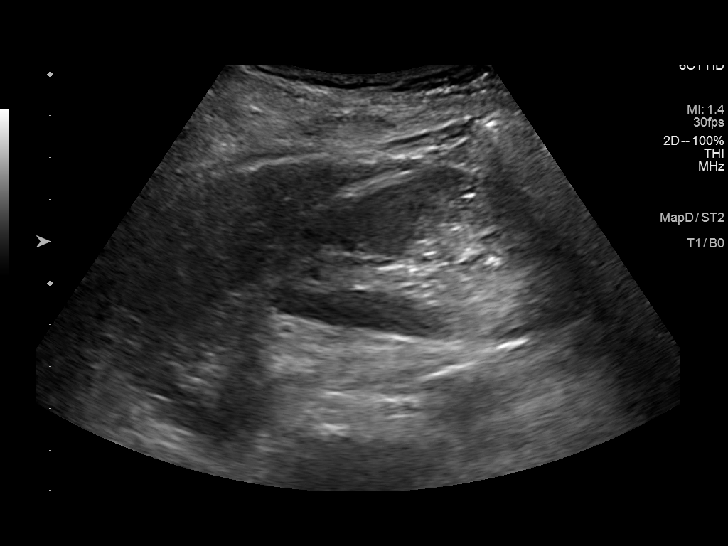
[im 9/54]
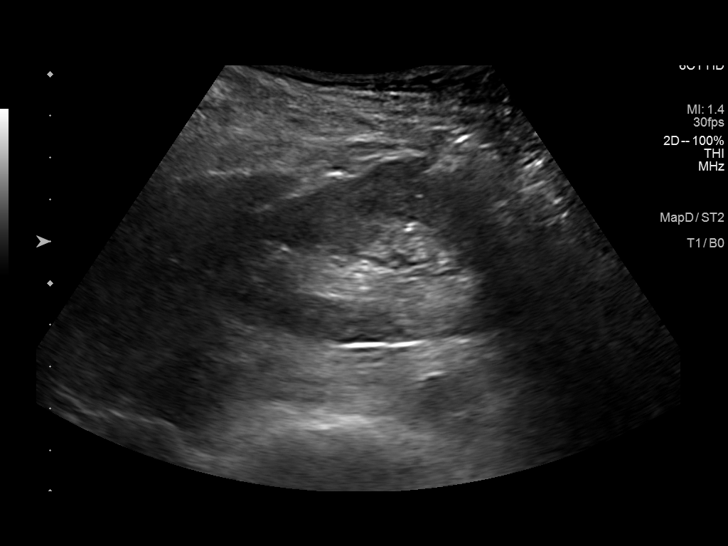
[im 14/54]
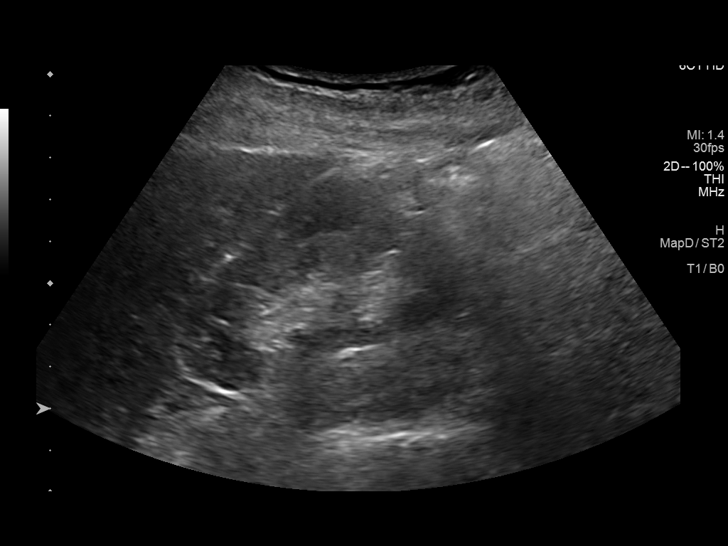
[im 18/54]
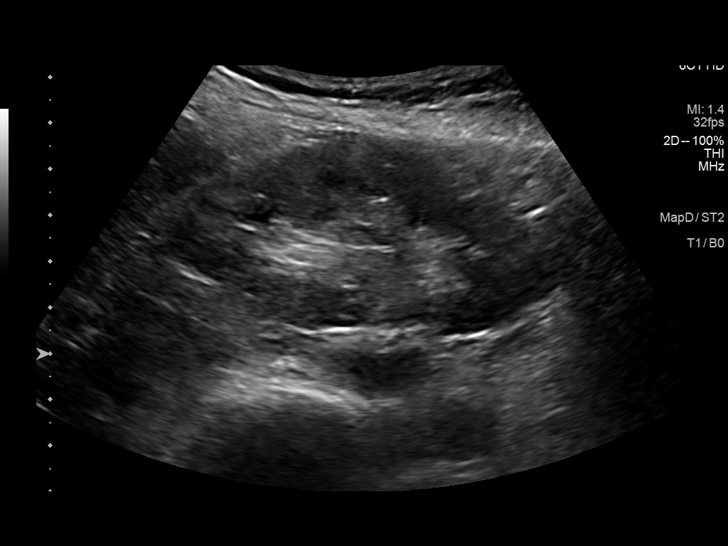
[im 20/54]
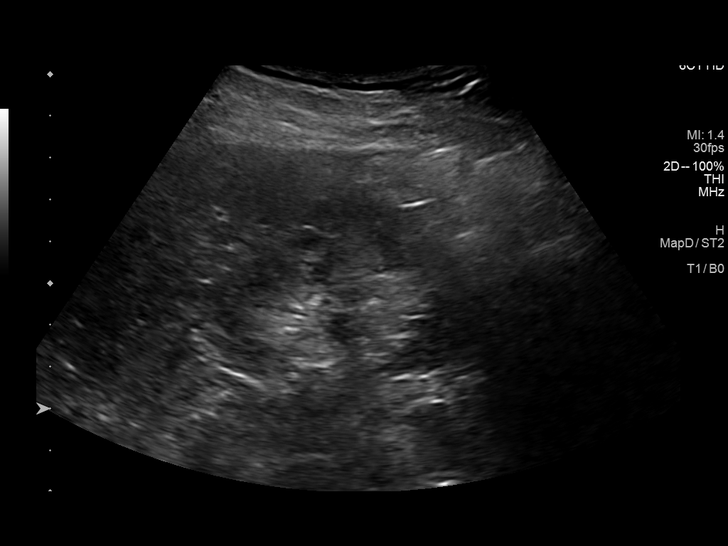
[im 25/54]
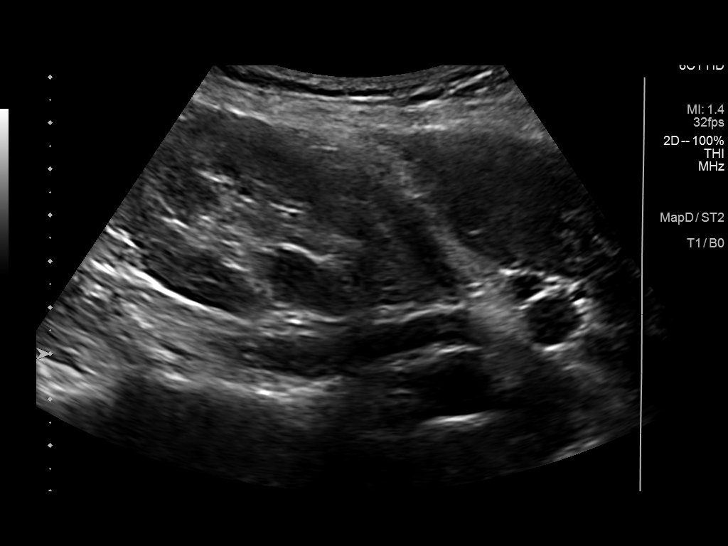
[im 29/54]
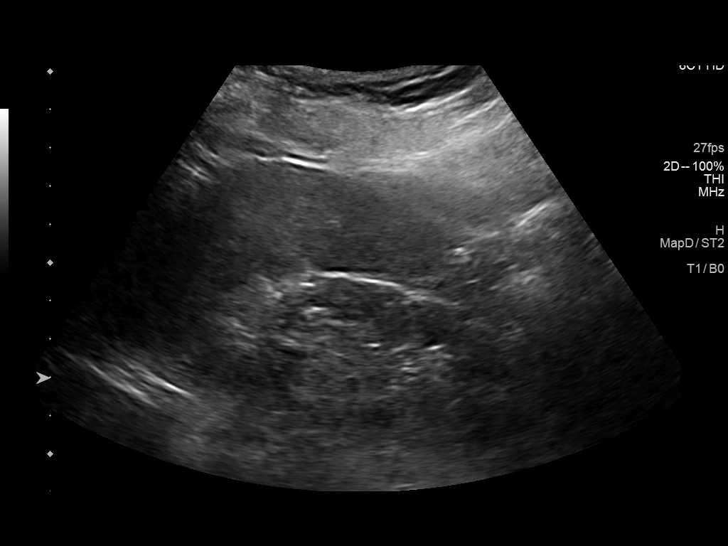
[im 34/54]
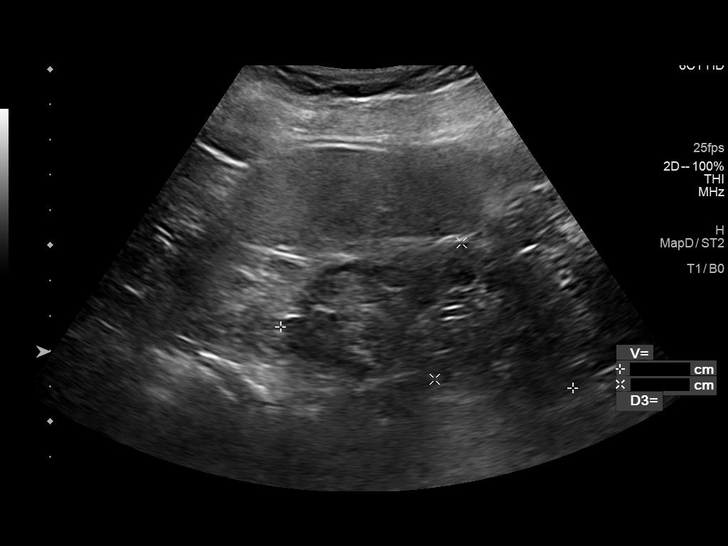
[im 36/54]
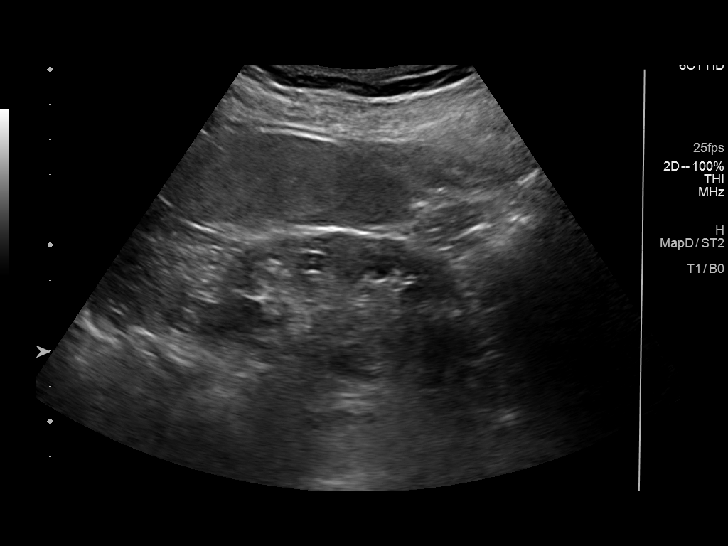
[im 40/54]
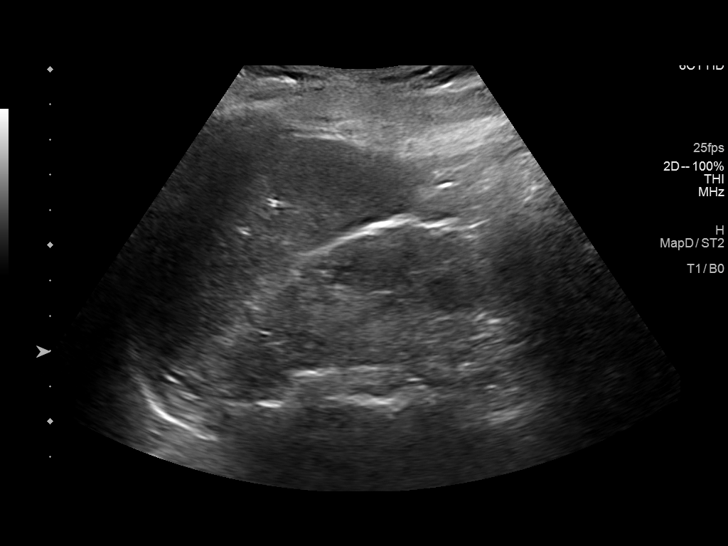
[im 45/54]
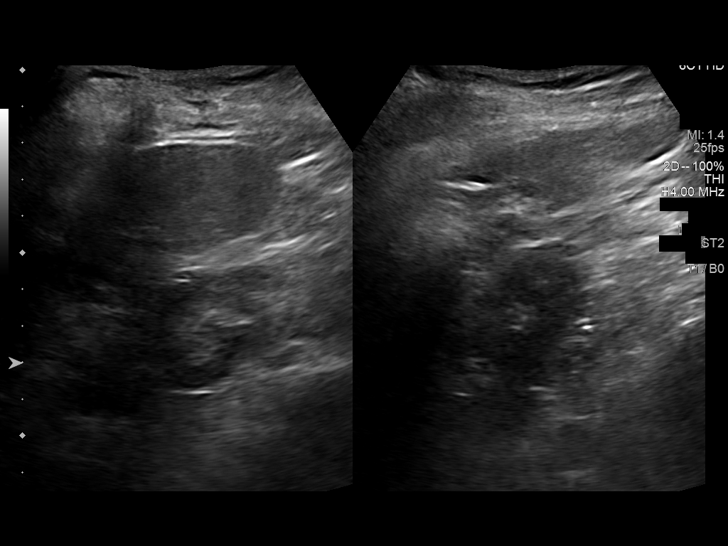
[im 49/54]
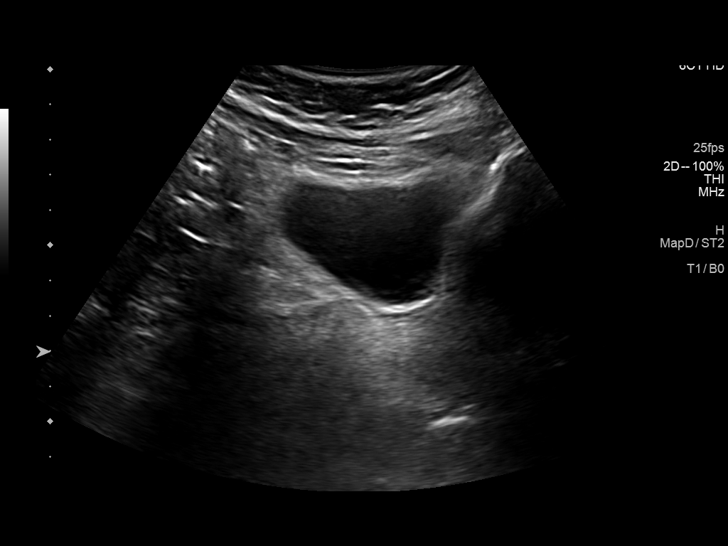
[im 54/54]
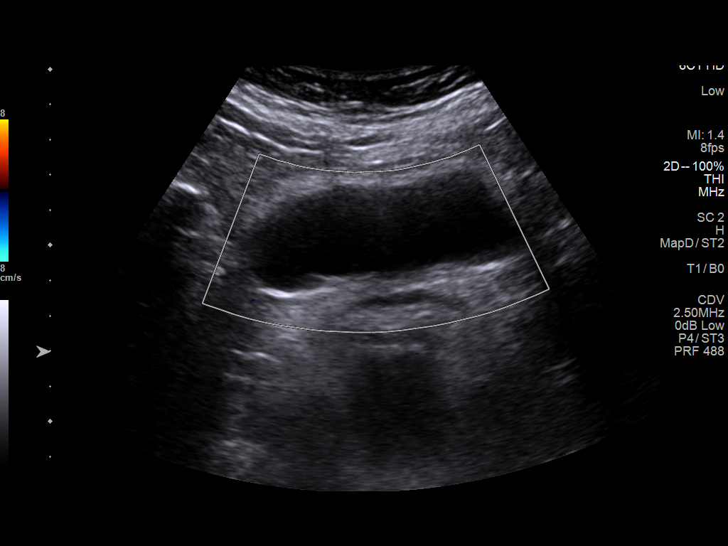

[14 of 25 positions shown; findings below may reference images not displayed]

FINDINGS: Evaluation is limited due to overlying bowel gas.

Right Kidney:

Renal measurements: 8.2 x 4.1 x 4.2 cm = volume: 74 mL. Minimally
increased echogenicity. No hydronephrosis or shadowing stone. There
is a 9 x 6 x 7 mm hypoechoic structure in the interpolar aspect of
the right kidney, likely a minimally complex cyst. Attention on
follow-up imaging recommended.

Left Kidney:

Renal measurements: 8.5 x 4.0 x 3.8 cm = volume: 66 mL. There is
mild parenchyma atrophy and cortical thinning. Mild increased
echogenicity. No hydronephrosis or shadowing stone.

Bladder:

Appears normal for degree of bladder distention.

Other:

None.
IMPRESSION: 1. Mildly echogenic kidneys in keeping with chronic kidney disease.
No hydronephrosis or shadowing stone.
2. Mild left renal parenchyma atrophy.
3. Probable minimally complex right renal cyst.

## 2021-01-26 DIAGNOSIS — H401131 Primary open-angle glaucoma, bilateral, mild stage: Secondary | ICD-10-CM | POA: Diagnosis not present

## 2021-02-04 ENCOUNTER — Other Ambulatory Visit: Payer: Self-pay | Admitting: Endocrinology

## 2021-03-08 DIAGNOSIS — N189 Chronic kidney disease, unspecified: Secondary | ICD-10-CM | POA: Diagnosis not present

## 2021-03-08 DIAGNOSIS — N1832 Chronic kidney disease, stage 3b: Secondary | ICD-10-CM | POA: Diagnosis not present

## 2021-03-15 DIAGNOSIS — H353134 Nonexudative age-related macular degeneration, bilateral, advanced atrophic with subfoveal involvement: Secondary | ICD-10-CM | POA: Diagnosis not present

## 2021-03-15 DIAGNOSIS — H35453 Secondary pigmentary degeneration, bilateral: Secondary | ICD-10-CM | POA: Diagnosis not present

## 2021-03-15 DIAGNOSIS — H3562 Retinal hemorrhage, left eye: Secondary | ICD-10-CM | POA: Diagnosis not present

## 2021-03-15 DIAGNOSIS — Z961 Presence of intraocular lens: Secondary | ICD-10-CM | POA: Diagnosis not present

## 2021-03-15 DIAGNOSIS — H43811 Vitreous degeneration, right eye: Secondary | ICD-10-CM | POA: Diagnosis not present

## 2021-03-15 DIAGNOSIS — H35363 Drusen (degenerative) of macula, bilateral: Secondary | ICD-10-CM | POA: Diagnosis not present

## 2021-03-16 DIAGNOSIS — N1832 Chronic kidney disease, stage 3b: Secondary | ICD-10-CM | POA: Diagnosis not present

## 2021-03-16 DIAGNOSIS — Q6102 Congenital multiple renal cysts: Secondary | ICD-10-CM | POA: Diagnosis not present

## 2021-03-16 DIAGNOSIS — D631 Anemia in chronic kidney disease: Secondary | ICD-10-CM | POA: Diagnosis not present

## 2021-03-16 DIAGNOSIS — H8109 Meniere's disease, unspecified ear: Secondary | ICD-10-CM | POA: Diagnosis not present

## 2021-03-29 DIAGNOSIS — H8109 Meniere's disease, unspecified ear: Secondary | ICD-10-CM | POA: Diagnosis not present

## 2021-03-29 DIAGNOSIS — H903 Sensorineural hearing loss, bilateral: Secondary | ICD-10-CM | POA: Diagnosis not present

## 2021-03-29 DIAGNOSIS — H9042 Sensorineural hearing loss, unilateral, left ear, with unrestricted hearing on the contralateral side: Secondary | ICD-10-CM | POA: Diagnosis not present

## 2021-04-06 ENCOUNTER — Ambulatory Visit (INDEPENDENT_AMBULATORY_CARE_PROVIDER_SITE_OTHER): Payer: Medicare Other | Admitting: Endocrinology

## 2021-04-06 ENCOUNTER — Encounter: Payer: Self-pay | Admitting: Endocrinology

## 2021-04-06 ENCOUNTER — Ambulatory Visit: Payer: Medicare Other | Admitting: Endocrinology

## 2021-04-06 VITALS — BP 130/72 | HR 68 | Ht 61.0 in | Wt 165.0 lb

## 2021-04-06 DIAGNOSIS — E0590 Thyrotoxicosis, unspecified without thyrotoxic crisis or storm: Secondary | ICD-10-CM

## 2021-04-06 LAB — T4, FREE: Free T4: 0.89 ng/dL (ref 0.60–1.60)

## 2021-04-06 LAB — TSH: TSH: 1.77 u[IU]/mL (ref 0.35–5.50)

## 2021-04-06 MED ORDER — METHIMAZOLE 5 MG PO TABS: 2.5000 mg | ORAL_TABLET | Freq: Every day | ORAL | 3 refills | Status: DC

## 2021-04-06 NOTE — Patient Instructions (Addendum)
Blood tests are requested for you today.  We'll let you know about the results.    ?If ever you have fever while taking methimazole, stop it and call us, even if the reason is obvious, because of the risk of a rare side-effect.   ?It is best to never miss the medication.  However, if you do miss it, next best is to double up the next time.   ?Please come back for a follow-up appointment in 6 months.   ? ?

## 2021-04-06 NOTE — Progress Notes (Signed)
? ?Subjective:  ? ? Patient ID: Sabrina English, female    DOB: 1933/09/09, 88 y.o.   MRN: [id number] ? ?HPI ?Pt returns for f/u of mild hyperthyroidism (dx'ed 2013; she was not on rx for this, until 8/20; she has never had thyroid imaging; even mildly low TSH is rx'ed, due to sxs and osteoporosis).  she feels well in general, except for weight gain.    ?Past Medical History:  ?Diagnosis Date  ? Cancer [facility])   ? melanoma l shoulder  ? GERD (gastroesophageal reflux disease)   ? Hyperlipidemia   ? Macular degeneration   ? Meniere's disease   ? Osteoporosis   ? osteopenia  ? ? ?Past Surgical History:  ?Procedure Laterality Date  ? [place]  ? BUNIONECTOMY    ? CATARACT Bilateral   ? MELANOMA EXCISION    ? left shoulder  ? ? ?Social History  ? ?Socioeconomic History  ? Marital status: Widowed  ?  Spouse name: Not on file  ? Number of children: Not on file  ? Years of education: Not on file  ? Highest education level: Not on file  ?Occupational History  ? Occupation: retired  ?Tobacco Use  ? Smoking status: Never  ? Smokeless tobacco: Never  ?Vaping Use  ? Vaping Use: Never used  ?Substance and Sexual Activity  ? Alcohol use: No  ?  Alcohol/week: 0.0 standard drinks  ? Drug use: No  ? Sexual activity: Not Currently  ?Other Topics Concern  ? Not on file  ?Social History Narrative  ? Not on file  ? ?Social Determinants of Health  ? ?Financial Resource Strain: Not on file  ?Food Insecurity: Not on file  ?Transportation Needs: Not on file  ?Physical Activity: Not on file  ?Stress: Not on file  ?Social Connections: Not on file  ?Intimate Partner Violence: Not on file  ? ? ?Current Outpatient Medications on File Prior to Visit  ?Medication Sig Dispense Refill  ? ALPRAZolam (XANAX) 0.25 MG tablet Take 1 tablet (0.25 mg total) by mouth at bedtime as needed. for sleep 90 tablet 1  ? budesonide (RHINOCORT AQUA) 32 MCG/ACT nasal spray Place 1 spray into both nostrils daily.    ? calcium-vitamin D (OSCAL WITH  D) 500-200 MG-UNIT per tablet Take 1 tablet by mouth daily.    ? cyclobenzaprine (FLEXERIL) 5 MG tablet Take 1 tablet (5 mg total) by mouth 3 (three) times daily as needed for muscle spasms. 30 tablet 5  ? dicyclomine (BENTYL) 20 MG tablet TAKE 1 TABLET (20 MG TOTAL) BY MOUTH EVERY 6 (SIX) HOURS. 360 tablet 1  ? famotidine (PEPCID) 20 MG tablet Take 1 tablet (20 mg total) by mouth daily. 90 tablet 3  ? ipratropium (ATROVENT) 0.06 % nasal spray Use two sprays in each nostril every 6 hours if needed to dry up nose 15 mL 11  ? meclizine (ANTIVERT) 25 MG tablet Take 1 tablet (25 mg total) by mouth 3 (three) times daily as needed. 30 tablet 11  ? Multiple Vitamins-Minerals (PRESERVISION AREDS 2 PO) Take by mouth 2 (two) times daily.    ? simvastatin (ZOCOR) 20 MG tablet TAKE 1 TABLET BY MOUTH DAILY AT 6 PM. 90 tablet 2  ? triamterene-hydrochlorothiazide (DYAZIDE) 37.5-25 MG capsule TAKE 1 CAPSULE BY MOUTH EVERY DAY 90 capsule 2  ? Vitamins-Lipotropics (LIPOFLAVONOID PO) Take 1 tablet by mouth 2 (two) times daily.    ? latanoprost (XALATAN) 0.005 % ophthalmic solution 1 drop at  bedtime.    ? ?No current facility-administered medications on file prior to visit.  ? ? ?Allergies  ?Allergen Reactions  ? Levaquin [Levofloxacin In D5w] Other (See Comments)  ?  Lower extremity pain   ? ? ?Family History  ?Problem Relation Age of Onset  ? Mental illness Mother   ? Hyperlipidemia Mother   ? Cancer Father   ? Asthma Father   ? Cancer Sister   ? Hypothyroidism Brother   ? Allergic rhinitis Neg Hx   ? Angioedema Neg Hx   ? Atopy Neg Hx   ? Eczema Neg Hx   ? Immunodeficiency Neg Hx   ? Urticaria Neg Hx   ? ? ?BP 130/72 (BP Location: Left Arm, Patient Position: Sitting, Cuff Size: Normal)   Pulse 68   Ht '5\' 1"'$  (1.549 m)   Wt 165 lb (74.8 kg)   SpO2 95%   BMI 31.18 kg/m?  ? ? ?Review of Systems ?Denies fever.   ?   ?Objective:  ? Physical Exam ?VITAL SIGNS:  See vs page.   ?GENERAL: no distress.   ?NECK: There is no palpable  thyroid enlargement.  No thyroid nodule is palpable.  No palpable lymphadenopathy at the anterior neck.   ? ? ?Lab Results  ?Component Value Date  ? TSH 1.77 04/06/2021  ? ?   ?Assessment & Plan:  ?Hyperthyroidism: well-controlled.  Please continue the same methimazole. ? ?

## 2021-04-26 ENCOUNTER — Ambulatory Visit
Admission: RE | Admit: 2021-04-26 | Discharge: 2021-04-26 | Disposition: A | Discharge status: Home / Self Care | Payer: Medicare Other | Source: Ambulatory Visit | Attending: Internal Medicine | Admitting: Internal Medicine

## 2021-04-26 ENCOUNTER — Encounter: Payer: Self-pay | Admitting: Internal Medicine

## 2021-04-26 ENCOUNTER — Ambulatory Visit (INDEPENDENT_AMBULATORY_CARE_PROVIDER_SITE_OTHER): Payer: Medicare Other | Admitting: Internal Medicine

## 2021-04-26 ENCOUNTER — Telehealth: Payer: Self-pay

## 2021-04-26 VITALS — BP 142/78 | HR 61 | Temp 98.0°F | Ht 61.0 in | Wt 163.5 lb

## 2021-04-26 DIAGNOSIS — R110 Nausea: Secondary | ICD-10-CM | POA: Diagnosis not present

## 2021-04-26 DIAGNOSIS — Z85820 Personal history of malignant melanoma of skin: Secondary | ICD-10-CM | POA: Diagnosis not present

## 2021-04-26 DIAGNOSIS — R1033 Periumbilical pain: Secondary | ICD-10-CM | POA: Diagnosis not present

## 2021-04-26 DIAGNOSIS — R5383 Other fatigue: Secondary | ICD-10-CM | POA: Diagnosis not present

## 2021-04-26 DIAGNOSIS — R10829 Rebound abdominal tenderness, unspecified site: Secondary | ICD-10-CM

## 2021-04-26 DIAGNOSIS — R1032 Left lower quadrant pain: Secondary | ICD-10-CM

## 2021-04-26 DIAGNOSIS — R140 Abdominal distension (gaseous): Secondary | ICD-10-CM | POA: Diagnosis not present

## 2021-04-26 LAB — POCT URINALYSIS DIPSTICK
Bilirubin, UA: NEGATIVE
Blood, UA: NEGATIVE
Glucose, UA: NEGATIVE
Ketones, UA: NEGATIVE
Leukocytes, UA: NEGATIVE
Nitrite, UA: NEGATIVE
Protein, UA: NEGATIVE
Spec Grav, UA: 1.02 (ref 1.010–1.025)
Urobilinogen, UA: 0.2 E.U./dL
pH, UA: 5 (ref 5.0–8.0)

## 2021-04-26 MED ORDER — IOPAMIDOL (ISOVUE-300) INJECTION 61%
100.0000 mL | Freq: Once | INTRAVENOUS | Status: AC | PRN
  Administered 2021-04-26: 80 mL via INTRAVENOUS

## 2021-04-26 NOTE — Progress Notes (Signed)
? ?  Subjective:  ? ? Patient ID: Sabrina English, female    DOB: 09-12-1933, 88 y.o.   MRN: [id number] ? ? ?        88 year old Female, longstanding patient in this practice with bilateral sensorineural hearing loss and Chronic kidney disease stage IIIb followed yearly by Nephrology as well as anxiety seen today with abdominal pain. ? ?HPI Patient says about 2 weeks ago, she had onset of abdominal discomfort above umbilicus.  Denies vomiting, diarrhea, constipation.  Did not seem to be affected by what she ate.  Denies dysuria or hematuria. ? ?Patient resides alone.  Her daughter is here with her today and is supportive.  She has a history of mild hyperthyroidism and is on methimazole per Dr. Loanne Drilling.  History of macular degeneration.  History of GE reflux treated with PPI.  History of osteopenia.  History of M?ni?re's disease and hearing loss.  Takes Zocor for hyperlipidemia.  Used to take Fosamax for osteopenia number of years ago but no longer does so.  History of mild anxiety. ? ? ? ?Review of Systems denies constipation ?A couple o days last week felt nauseated but took meds on empty stomach at that time. ? ?Had CT of abdomen 2004 after episode of nausea and abdominal pain.  Had a 4 mm right hepatic cyst and degenerative disease in lumbosacral spine. ? ?Had renal ultrasound in March 2022 showing small cyst in right kidney.  No obstruction of the kidneys. ? ?   ?Objective:  ? Physical Exam ?Patient is anxious.  Blood pressure elevated at 142/78 pulse 61 regular temperature 98 degrees by ear thermometer.  Pulse oximetry 98% on room air weight 163 pounds 8 ounces BMI 30.89 ? ?Skin: Warm and dry.  Chest is clear to auscultation.  Cardiac exam: Regular rate and rhythm.  Abdomen is soft nondistended without hepatosplenomegaly.  She is tender in the left lower quadrant with some mild rebound tenderness. ? ?Dipstick UA is normal.  CBC and c-Met drawn and are pending. ? ?   ?Assessment & Plan:  ?Left lower quadrant  pain present for over 2 weeks and appears to have some rebound tenderness today.  Rule out diverticulitis or mass lesion.  She is going today to have CT of abdomen and pelvis.  She is to stay with  liquids for now and I will be back in touch with her.  Labs drawn and sent stat to the lab. ? ?

## 2021-04-26 NOTE — Telephone Encounter (Signed)
Patient states that She has been having pain that runs across her stomach at her belly button and settles in her left side. No fever no changes in bowel. She would like an appointment. ?

## 2021-04-26 NOTE — Telephone Encounter (Signed)
Scheduled

## 2021-04-26 NOTE — Patient Instructions (Signed)
Please proceed to x-ray for CT of abdomen and pelvis due to concern for rebound tenderness.  Labs drawn and pending.  We will be back in touch with you.  Stay with liquids for now. ?

## 2021-04-27 LAB — CBC WITH DIFFERENTIAL/PLATELET
Absolute Monocytes: 410 cells/uL (ref 200–950)
Basophils Absolute: 13 cells/uL (ref 0–200)
Basophils Relative: 0.2 %
Eosinophils Absolute: 91 cells/uL (ref 15–500)
Eosinophils Relative: 1.4 %
HCT: 41.4 % (ref 35.0–45.0)
Hemoglobin: 13.7 g/dL (ref 11.7–15.5)
Lymphs Abs: 1554 cells/uL (ref 850–3900)
MCH: 29.5 pg (ref 27.0–33.0)
MCHC: 33.1 g/dL (ref 32.0–36.0)
MCV: 89 fL (ref 80.0–100.0)
MPV: 10 fL (ref 7.5–12.5)
Monocytes Relative: 6.3 %
Neutro Abs: 4433 cells/uL (ref 1500–7800)
Neutrophils Relative %: 68.2 %
Platelets: 277 10*3/uL (ref 140–400)
RBC: 4.65 10*6/uL (ref 3.80–5.10)
RDW: 12.1 % (ref 11.0–15.0)
Total Lymphocyte: 23.9 %
WBC: 6.5 10*3/uL (ref 3.8–10.8)

## 2021-04-27 LAB — COMPLETE METABOLIC PANEL WITH GFR
AG Ratio: 1.8 (calc) (ref 1.0–2.5)
ALT: 20 U/L (ref 6–29)
AST: 22 U/L (ref 10–35)
Albumin: 4.3 g/dL (ref 3.6–5.1)
Alkaline phosphatase (APISO): 105 U/L (ref 37–153)
BUN/Creatinine Ratio: 22 (calc) (ref 6–22)
BUN: 30 mg/dL — ABNORMAL HIGH (ref 7–25)
CO2: 29 mmol/L (ref 20–32)
Calcium: 10.2 mg/dL (ref 8.6–10.4)
Chloride: 105 mmol/L (ref 98–110)
Creat: 1.36 mg/dL — ABNORMAL HIGH (ref 0.60–0.95)
Globulin: 2.4 g/dL (calc) (ref 1.9–3.7)
Glucose, Bld: 97 mg/dL (ref 65–99)
Potassium: 4.2 mmol/L (ref 3.5–5.3)
Sodium: 143 mmol/L (ref 135–146)
Total Bilirubin: 0.8 mg/dL (ref 0.2–1.2)
Total Protein: 6.7 g/dL (ref 6.1–8.1)
eGFR: 38 mL/min/{1.73_m2} — ABNORMAL LOW (ref >=60)

## 2021-05-11 DIAGNOSIS — L821 Other seborrheic keratosis: Secondary | ICD-10-CM | POA: Diagnosis not present

## 2021-05-11 DIAGNOSIS — D1801 Hemangioma of skin and subcutaneous tissue: Secondary | ICD-10-CM | POA: Diagnosis not present

## 2021-05-11 DIAGNOSIS — Z85828 Personal history of other malignant neoplasm of skin: Secondary | ICD-10-CM | POA: Diagnosis not present

## 2021-05-11 DIAGNOSIS — D2261 Melanocytic nevi of right upper limb, including shoulder: Secondary | ICD-10-CM | POA: Diagnosis not present

## 2021-05-11 DIAGNOSIS — D225 Melanocytic nevi of trunk: Secondary | ICD-10-CM | POA: Diagnosis not present

## 2021-05-11 DIAGNOSIS — D485 Neoplasm of uncertain behavior of skin: Secondary | ICD-10-CM | POA: Diagnosis not present

## 2021-05-11 DIAGNOSIS — Z85820 Personal history of malignant melanoma of skin: Secondary | ICD-10-CM | POA: Diagnosis not present

## 2021-05-11 DIAGNOSIS — D2262 Melanocytic nevi of left upper limb, including shoulder: Secondary | ICD-10-CM | POA: Diagnosis not present

## 2021-05-11 DIAGNOSIS — L812 Freckles: Secondary | ICD-10-CM | POA: Diagnosis not present

## 2021-05-11 DIAGNOSIS — C44319 Basal cell carcinoma of skin of other parts of face: Secondary | ICD-10-CM | POA: Diagnosis not present

## 2021-05-13 ENCOUNTER — Encounter: Payer: Self-pay | Admitting: Physician Assistant

## 2021-05-13 NOTE — Telephone Encounter (Signed)
Sabrina English has called to say she is still having middle to left lower abdomen pain., no fever, some nausea at times. What can she do? ?

## 2021-05-13 NOTE — Telephone Encounter (Signed)
Called and let patient know that she has an appointment with GI on 06/02/2021, she verbalized understanding. I also let her know they may call with something sooner, if pain gets worse she can always go to emergency room. ?

## 2021-05-25 ENCOUNTER — Other Ambulatory Visit: Payer: Self-pay | Admitting: Internal Medicine

## 2021-06-01 ENCOUNTER — Ambulatory Visit: Payer: Medicare Other | Admitting: Endocrinology

## 2021-06-02 ENCOUNTER — Encounter: Payer: Self-pay | Admitting: Physician Assistant

## 2021-06-02 ENCOUNTER — Ambulatory Visit (INDEPENDENT_AMBULATORY_CARE_PROVIDER_SITE_OTHER): Payer: Medicare Other | Admitting: Physician Assistant

## 2021-06-02 ENCOUNTER — Ambulatory Visit: Payer: Medicare Other | Admitting: Endocrinology

## 2021-06-02 VITALS — BP 126/78 | HR 63 | Ht 63.0 in | Wt 163.8 lb

## 2021-06-02 DIAGNOSIS — R142 Eructation: Secondary | ICD-10-CM

## 2021-06-02 DIAGNOSIS — K5900 Constipation, unspecified: Secondary | ICD-10-CM | POA: Diagnosis not present

## 2021-06-02 DIAGNOSIS — R109 Unspecified abdominal pain: Secondary | ICD-10-CM | POA: Diagnosis not present

## 2021-06-02 MED ORDER — FAMOTIDINE 40 MG PO TABS: 40.0000 mg | ORAL_TABLET | Freq: Two times a day (BID) | ORAL | 5 refills | Status: DC

## 2021-06-02 MED ORDER — DICYCLOMINE HCL 20 MG PO TABS: 20.0000 mg | ORAL_TABLET | Freq: Four times a day (QID) | ORAL | 2 refills | Status: DC

## 2021-06-02 NOTE — Progress Notes (Signed)
Chief Complaint: Abdominal pain  HPI:  Sabrina English is an 88 year old female, known to Dr. Loletha Carrow, with a past medical history as listed below including GERD and osteoporosis, who was referred to me by Elby Showers, MD for a complaint of abdominal pain.    09/03/2015 patient seen in clinic by Dr. Jearld Adjutant for heme positive stool.  At that time scheduled for an EGD with possible dilation and colonoscopy.    09/24/2015 colonoscopy with diverticulosis in the sigmoid colon otherwise normal.  EGD with tortuous esophagus, small hiatal hernia normal and mild Schatzki's ring.    04/26/2021 CT of the abdomen pelvis with contrast for left lower quadrant pain with cortical atrophy and scarring of the left kidney some subsegmental atelectasis in the left lung base and no acute intra-abdominal or intrapelvic abnormalities.    04/26/2021 CMP with creatinine 1.36 and BUN 30 and otherwise normal.  CBC normal.    Today, the patient presents to clinic accompanied by her daughter and they explain that over the past 7 to 8 weeks she has been dealing with abdominal pain which was initially was periumbilical and moved to her left lower quadrant and now has settled back again periumbilical/epigastric.  Tells me that she has had some episodes of constipation over this period of time has had to use occasional stool softeners, the last last week but tells me that she will have a bowel movement every day now though it is still just "little bits", sometimes the pain increases with eating and other times it does not.  She has also been belching a lot recently.  Tells me the pain is typically sharp and quick, but is there most days.  She has not tried her Dicyclomine for this pain.  She is on Famotidine 40 mg once a day after being changed from a PPI to years ago by her kidney doctor.  Denies any overt heartburn or reflux symptoms.    Denies fever, chills, weight loss, blood in her stool or symptoms that awaken her from  sleep.  Past Medical History:  Diagnosis Date   Cancer ([place])    melanoma l shoulder   GERD (gastroesophageal reflux disease)    Hyperlipidemia    Macular degeneration    Melanoma ([place])    Meniere's disease    Osteoporosis    osteopenia   Thyroid disease     Past Surgical History:  Procedure Laterality Date   AXILLARY NODE DISSECTION  1983   BUNIONECTOMY     CATARACT Bilateral    MELANOMA EXCISION     left shoulder    Current Outpatient Medications  Medication Sig Dispense Refill   calcium-vitamin D (OSCAL WITH D) 500-200 MG-UNIT per tablet Take 1 tablet by mouth daily.     cholecalciferol (VITAMIN D3) 25 MCG (1000 UNIT) tablet Take 1,000 Units by mouth daily.     dicyclomine (BENTYL) 20 MG tablet TAKE 1 TABLET (20 MG TOTAL) BY MOUTH EVERY 6 (SIX) HOURS. 360 tablet 1   famotidine (PEPCID) 20 MG tablet Take 1 tablet (20 mg total) by mouth daily. 90 tablet 3   latanoprost (XALATAN) 0.005 % ophthalmic solution Place 1 drop into both eyes nightly.     meclizine (ANTIVERT) 25 MG tablet Take 1 tablet (25 mg total) by mouth 3 (three) times daily as needed. 30 tablet 11   methimazole (TAPAZOLE) 5 MG tablet Take 0.5 tablets (2.5 mg total) by mouth daily. 45 tablet 3   simvastatin (ZOCOR) 20 MG tablet TAKE  1 TABLET BY MOUTH DAILY AT 6 PM. 90 tablet 2   triamterene-hydrochlorothiazide (DYAZIDE) 37.5-25 MG capsule TAKE 1 CAPSULE BY MOUTH EVERY DAY 90 capsule 2   Vitamins-Lipotropics (LIPOFLAVONOID PO) Take 1 tablet by mouth 2 (two) times daily.     ALPRAZolam (XANAX) 0.25 MG tablet Take 1 tablet (0.25 mg total) by mouth at bedtime as needed. for sleep (Patient not taking: Reported on 06/02/2021) 90 tablet 1   cyclobenzaprine (FLEXERIL) 5 MG tablet Take 1 tablet (5 mg total) by mouth 3 (three) times daily as needed for muscle spasms. (Patient not taking: Reported on 06/02/2021) 30 tablet 5   ipratropium (ATROVENT) 0.06 % nasal spray Use two sprays in each nostril every 6 hours if needed to dry  up nose (Patient not taking: Reported on 06/02/2021) 15 mL 11   No current facility-administered medications for this visit.    Allergies as of 06/02/2021 - Review Complete 06/02/2021  Allergen Reaction Noted   Levaquin [levofloxacin in d5w] Other (See Comments) 12/09/2016    Family History  Problem Relation Age of Onset   Mental illness Mother    Hyperlipidemia Mother    Cancer Father    Asthma Father    Cancer Sister    Hypothyroidism Brother    Allergic rhinitis Neg Hx    Angioedema Neg Hx    Atopy Neg Hx    Eczema Neg Hx    Immunodeficiency Neg Hx    Urticaria Neg Hx     Social History   Socioeconomic History   Marital status: Widowed    Spouse name: Not on file   Number of children: Not on file   Years of education: Not on file   Highest education level: Not on file  Occupational History   Occupation: retired  Tobacco Use   Smoking status: Never   Smokeless tobacco: Never  Vaping Use   Vaping Use: Never used  Substance and Sexual Activity   Alcohol use: No    Alcohol/week: 0.0 standard drinks   Drug use: No   Sexual activity: Not Currently  Other Topics Concern   Not on file  Social History Narrative   Not on file   Social Determinants of Health   Financial Resource Strain: Not on file  Food Insecurity: Not on file  Transportation Needs: Not on file  Physical Activity: Not on file  Stress: Not on file  Social Connections: Not on file  Intimate Partner Violence: Not on file    Review of Systems:    Constitutional: No weight loss, fever or chills Skin: No rash  Cardiovascular: No chest pain   Respiratory: No SOB Gastrointestinal: See HPI and otherwise negative Genitourinary: No dysuria  Neurological: No headache, dizziness or syncope Musculoskeletal: No new muscle or joint pain Hematologic: No bleeding  Psychiatric: No history of depression or anxiety   Physical Exam:  Vital signs: BP 126/78   Pulse 63   Ht '5\' 3"'$  (1.6 m)   Wt 163 lb  12.8 oz (74.3 kg)   SpO2 97%   BMI 29.02 kg/m    Constitutional:   Pleasant Elderly Caucasian female appears to be in NAD, Well developed, Well nourished, alert and cooperative Head:  Normocephalic and atraumatic. Eyes:   PEERL, EOMI. No icterus. Conjunctiva pink. Ears:  Normal auditory acuity. Neck:  Supple Throat: Oral cavity and pharynx without inflammation, swelling or lesion.  Respiratory: Respirations even and unlabored. Lungs clear to auscultation bilaterally.   No wheezes, crackles, or rhonchi.  Cardiovascular:  Normal S1, S2. No MRG. Regular rate and rhythm. No peripheral edema, cyanosis or pallor.  Gastrointestinal:  Soft, nondistended, mild epigastric ttp and LLQ ttp No rebound or guarding. Normal bowel sounds. No appreciable masses or hepatomegaly. Rectal:  Not performed.  Msk:  Symmetrical without gross deformities. Without edema, no deformity or joint abnormality.  Neurologic:  Alert and  oriented x4;  grossly normal neurologically.  Skin:   Dry and intact without significant lesions or rashes. Psychiatric: Demonstrates good judgement and reason without abnormal affect or behaviors.  RELEVANT LABS AND IMAGING: CBC    Component Value Date/Time   WBC 6.5 04/26/2021 1219   RBC 4.65 04/26/2021 1219   HGB 13.7 04/26/2021 1219   HCT 41.4 04/26/2021 1219   PLT 277 04/26/2021 1219   MCV 89.0 04/26/2021 1219   MCV 84.8 05/07/2014 1115   MCH 29.5 04/26/2021 1219   MCHC 33.1 04/26/2021 1219   RDW 12.1 04/26/2021 1219   LYMPHSABS 1,554 04/26/2021 1219   MONOABS 0.5 02/27/2019 1351   EOSABS 91 04/26/2021 1219   BASOSABS 13 04/26/2021 1219    CMP     Component Value Date/Time   NA 143 04/26/2021 1219   K 4.2 04/26/2021 1219   CL 105 04/26/2021 1219   CO2 29 04/26/2021 1219   GLUCOSE 97 04/26/2021 1219   BUN 30 (H) 04/26/2021 1219   CREATININE 1.36 (H) 04/26/2021 1219   CALCIUM 10.2 04/26/2021 1219   PROT 6.7 04/26/2021 1219   ALBUMIN 3.8 06/07/2016 1107   AST 22  04/26/2021 1219   ALT 20 04/26/2021 1219   ALKPHOS 69 06/07/2016 1107   BILITOT 0.8 04/26/2021 1219   GFRNONAA 35 (L) 06/23/2020 1111   GFRAA 40 (L) 06/23/2020 1111    Assessment: 1.  Abdominal pain: Seems to move and is sharp in nature; likely related to below vs IBS 2.  GERD: Some increase in eructations lately with symptoms as above and below; consider gastritis versus relation to below 3.  Constipation: Over the past few weeks, now with only small bits of stool, likely contributing to left lower quadrant discomfort given recent normal CT and labs  Plan: 1.  Given recent work-up including imaging and labs, we will start with some little changes to see if they are helpful.  Patient does express the pain is 90% better already over the past couple of weeks. 2.  Recommend the patient start a fiber supplement such as Benefiber once daily 3.  Recommend the patient start MiraLAX once daily, titrated to a more full normal bowel movement once daily 4.  Encouraged the patient increase water intake to the 6-eight 8 ounce glasses of water per day. 5.  Patient has Dicyclomine at home, refilled this for her, 20 mg every 4-6 hours as needed for pain #30 with 2 refills.  Encouraged her to try this when she has increases in pain to see if it is helpful. 6.  Also increase Famotidine to 40 mg twice a day, every morning and nightly #60 with 5 refills. 7.  Patient to follow with me in 1 to 2 months or sooner if necessary.  Ellouise Newer, PA-C [facility] 06/02/2021, 10:33 AM  Cc: Elby Showers, MD

## 2021-06-02 NOTE — Progress Notes (Signed)
____________________________________________________________  Attending physician addendum:  Thank you for sending this case to me. I have reviewed the entire note and agree with the plan with the following changes:  I think her abdominal pain is chronic constipation due to left-sided diverticulosis.  No diverticulitis apparent on recent CT scan. Agree with adding fiber and MiraLAX.  If that is not especially helpful, then stop the fiber and continue MiraLAX. In addition, I generally avoid using dicyclomine in patients with constipation at this age due to the risk of anticholinergic side effects and perhaps worsening the constipation.  Wilfrid Lund, MD  ____________________________________________________________

## 2021-06-02 NOTE — Patient Instructions (Addendum)
We have sent the following medications to your pharmacy for you to pick up at your convenience: Famotidine 40 mg twice daily.  Dicyclomine 20 mg as needed for pain.  Start Benefiber 2 teaspoons in 8 ounces of liquid daily.  Start Miralax 1 capful daily in 8 ounces of liquid.  If you are age 86 or older, your body mass index should be between 23-30. Your Body mass index is 29.02 kg/m. If this is out of the aforementioned range listed, please consider follow up with your Primary Care Provider.  If you are age 19 or younger, your body mass index should be between 19-25. Your Body mass index is 29.02 kg/m. If this is out of the aformentioned range listed, please consider follow up with your Primary Care Provider.   ________________________________________________________  The [facility] providers would like to encourage you to use [facility] to communicate with providers for non-urgent requests or questions.  Due to long hold times on the telephone, sending your provider a message by [facility] may be a faster and more efficient way to get a response.  Please allow 48 business hours for a response.  Please remember that this is for non-urgent requests.  _______________________________________________________

## 2021-06-22 ENCOUNTER — Other Ambulatory Visit: Payer: Medicare Other

## 2021-06-22 DIAGNOSIS — E0590 Thyrotoxicosis, unspecified without thyrotoxic crisis or storm: Secondary | ICD-10-CM | POA: Diagnosis not present

## 2021-06-22 DIAGNOSIS — N1831 Chronic kidney disease, stage 3a: Secondary | ICD-10-CM

## 2021-06-22 DIAGNOSIS — E7849 Other hyperlipidemia: Secondary | ICD-10-CM | POA: Diagnosis not present

## 2021-06-23 LAB — TSH: TSH: 1.08 mIU/L (ref 0.40–4.50)

## 2021-06-23 LAB — CBC WITH DIFFERENTIAL/PLATELET
Absolute Monocytes: 460 cells/uL (ref 200–950)
Basophils Absolute: 19 cells/uL (ref 0–200)
Basophils Relative: 0.5 %
Eosinophils Absolute: 99 cells/uL (ref 15–500)
Eosinophils Relative: 2.6 %
HCT: 39.7 % (ref 35.0–45.0)
Hemoglobin: 13 g/dL (ref 11.7–15.5)
Lymphs Abs: 1554 cells/uL (ref 850–3900)
MCH: 29.2 pg (ref 27.0–33.0)
MCHC: 32.7 g/dL (ref 32.0–36.0)
MCV: 89.2 fL (ref 80.0–100.0)
MPV: 9.9 fL (ref 7.5–12.5)
Monocytes Relative: 12.1 %
Neutro Abs: 1668 cells/uL (ref 1500–7800)
Neutrophils Relative %: 43.9 %
Platelets: 251 10*3/uL (ref 140–400)
RBC: 4.45 10*6/uL (ref 3.80–5.10)
RDW: 12.1 % (ref 11.0–15.0)
Total Lymphocyte: 40.9 %
WBC: 3.8 10*3/uL (ref 3.8–10.8)

## 2021-06-23 LAB — COMPLETE METABOLIC PANEL WITH GFR
AG Ratio: 1.7 (calc) (ref 1.0–2.5)
ALT: 20 U/L (ref 6–29)
AST: 22 U/L (ref 10–35)
Albumin: 3.8 g/dL (ref 3.6–5.1)
Alkaline phosphatase (APISO): 87 U/L (ref 37–153)
BUN/Creatinine Ratio: 15 (calc) (ref 6–22)
BUN: 22 mg/dL (ref 7–25)
CO2: 26 mmol/L (ref 20–32)
Calcium: 9.7 mg/dL (ref 8.6–10.4)
Chloride: 105 mmol/L (ref 98–110)
Creat: 1.5 mg/dL — ABNORMAL HIGH (ref 0.60–0.95)
Globulin: 2.2 g/dL (calc) (ref 1.9–3.7)
Glucose, Bld: 93 mg/dL (ref 65–99)
Potassium: 4 mmol/L (ref 3.5–5.3)
Sodium: 143 mmol/L (ref 135–146)
Total Bilirubin: 0.7 mg/dL (ref 0.2–1.2)
Total Protein: 6 g/dL — ABNORMAL LOW (ref 6.1–8.1)
eGFR: 34 mL/min/{1.73_m2} — ABNORMAL LOW (ref >=60)

## 2021-06-23 LAB — LIPID PANEL
Cholesterol: 182 mg/dL (ref <200)
HDL: 92 mg/dL (ref >=50)
LDL Cholesterol (Calc): 71 mg/dL (calc)
Non-HDL Cholesterol (Calc): 90 mg/dL (calc) (ref <130)
Total CHOL/HDL Ratio: 2 (calc) (ref <5.0)
Triglycerides: 107 mg/dL (ref <150)

## 2021-06-28 ENCOUNTER — Ambulatory Visit (INDEPENDENT_AMBULATORY_CARE_PROVIDER_SITE_OTHER): Payer: Medicare Other | Admitting: Internal Medicine

## 2021-06-28 ENCOUNTER — Encounter: Payer: Self-pay | Admitting: Internal Medicine

## 2021-06-28 VITALS — BP 142/88 | HR 64 | Temp 97.6°F | Ht 62.5 in | Wt 163.8 lb

## 2021-06-28 DIAGNOSIS — Z8659 Personal history of other mental and behavioral disorders: Secondary | ICD-10-CM | POA: Diagnosis not present

## 2021-06-28 DIAGNOSIS — N1832 Chronic kidney disease, stage 3b: Secondary | ICD-10-CM | POA: Diagnosis not present

## 2021-06-28 DIAGNOSIS — Z85820 Personal history of malignant melanoma of skin: Secondary | ICD-10-CM

## 2021-06-28 DIAGNOSIS — M8580 Other specified disorders of bone density and structure, unspecified site: Secondary | ICD-10-CM | POA: Diagnosis not present

## 2021-06-28 DIAGNOSIS — J302 Other seasonal allergic rhinitis: Secondary | ICD-10-CM | POA: Diagnosis not present

## 2021-06-28 DIAGNOSIS — Z8719 Personal history of other diseases of the digestive system: Secondary | ICD-10-CM | POA: Diagnosis not present

## 2021-06-28 DIAGNOSIS — H903 Sensorineural hearing loss, bilateral: Secondary | ICD-10-CM

## 2021-06-28 DIAGNOSIS — Z0000 Encounter for general adult medical examination without abnormal findings: Secondary | ICD-10-CM

## 2021-06-28 DIAGNOSIS — H3530 Unspecified macular degeneration: Secondary | ICD-10-CM | POA: Diagnosis not present

## 2021-06-28 DIAGNOSIS — H8109 Meniere's disease, unspecified ear: Secondary | ICD-10-CM

## 2021-06-28 LAB — POCT URINALYSIS DIPSTICK
Bilirubin, UA: NEGATIVE
Blood, UA: NEGATIVE
Glucose, UA: NEGATIVE
Ketones, UA: NEGATIVE
Leukocytes, UA: NEGATIVE
Nitrite, UA: NEGATIVE
Protein, UA: NEGATIVE
Spec Grav, UA: 1.015 (ref 1.010–1.025)
Urobilinogen, UA: 0.2 E.U./dL
pH, UA: 5 (ref 5.0–8.0)

## 2021-06-28 MED ORDER — CYCLOBENZAPRINE HCL 10 MG PO TABS: 10.0000 mg | ORAL_TABLET | Freq: Every day | ORAL | 1 refills | Status: DC

## 2021-06-28 NOTE — Progress Notes (Signed)
Subjective:    Patient ID: Sabrina English, female    DOB: 12-13-1933, 88 y.o.   MRN: [id number]  HPI  88 year old Female seen for Medicare wellness, health maintenance exam and evaluation of medical issues.  Longstanding history of Mnire's disease followed at [facility]  Takes Dyazide.  Has meclizine on hand if needed but seldom has to take any.  Remote history of C7 radiculopathy which is currently not an issue.  Left bunionectomy 1996.  She had melanoma removed from her left shoulder 1992 and received immunotherapy by Dr. Geroge Baseman at [facility].  Takes Zocor for hyperlipidemia.  Has taken Fosamax for osteopenia number of years ago but no longer takes this.  Has bilateral impacted cerumen removed at [facility] due to stenosis of ear canals.  History of mild anxiety.  Social history: She is a widow.  She has 3 adult children, 2 daughters and 1 son.  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  She does not smoke or consume alcohol.  Family history: Father died at age 17 of prostate cancer.  Mother died at age 39 of Alzheimer's disease complications.  1 sister died of colon cancer at age 13.  104 brother in good health.  1 brother in fair health.  Daughter has been diagnosed with pulmonary sarcoidosis.   She is due for several vaccines that can be handled through her local pharamcy. Recommend annual flu vaccine in the Fall. Recommend Pneumococcal 20 vaccine.  Recently got a hearing aid and is adjusting well to it.  History of bilateral hearing loss.  Dr. Loletha Carrow says she does not have to undergo any more screening for colon cancer and she is pleased about that.  History of chronic kidney disease stage IIIb.  Followed by Nephrology.  Fasting labs showed her creatinine to be 1.50 but when she is well-hydrated the creatinine is more like 1.36.  Lipid panel, CBC and TSH are all normal.  Sees  Dr. Ellie Lunch for primary open-angle glaucoma.  Uses Xalatan eyedrops.  Followed by Endocrinology for hyperthyroidism and is stable.  Takes Tapazole for this.  History of hyperlipidemia treated with simvastatin 20 mg daily.        Review of Systems mammogram scheduled for July. Immunization reviewed and recommendations made.  Today feels well and has no complaints     Objective:   Physical Exam She is slightly anxious and blood pressure I think is elevated due to anxiety at 142/88 pulse 64 regular pulse oximetry 97% weight 163 pounds 12 ounces BMI 29.47  Skin: Warm and dry.  Nodes none.  TMs clear.  Neck supple.  No carotid bruits.  Chest clear.  Cardiac exam: Regular rate and rhythm without ectopy or murmur.  Abdomen soft nondistended without hepatosplenomegaly masses or tenderness.  No lower extremity pitting edema.  Brief neurological exam is intact without gross focal deficits.     Assessment & Plan:  Hyperthyroidism treated by Dr. Loanne Drilling with Tapazole and stable  Chronic kidney disease stage IIIb-seen at [facility] in stable creatinine is stable at 1.50 and varies between 1.34 and 1.51.  I think it varies with hydration.  History of melanoma treated with immunotherapy at [facility] in the 1990s without recurrence  Bilateral hearing loss and recently got hearing aid  History of Mnire's disease treated with Dyazide.  Takes meclizine if needed for dizziness.  Anxiety treated with low-dose Xanax  History  of allergic rhinitis seen by Dr. Neldon Mc  Irritable bowel treated with Bentyl;  Glaucoma treated with Xalatan  Hyperlipidemia treated with Zocor and lipids are normal.  GE reflux treated with famotidine  History of irritable bowel syndrome treated with Bentyl he will

## 2021-07-19 DIAGNOSIS — Z1231 Encounter for screening mammogram for malignant neoplasm of breast: Secondary | ICD-10-CM | POA: Diagnosis not present

## 2021-07-25 NOTE — Patient Instructions (Addendum)
Says she would like refill on Flexeril to take at bedtime.  This was provided.  Continue current medications and follow-up in 1 year or as needed.  Labs are stable.

## 2021-07-28 ENCOUNTER — Ambulatory Visit (INDEPENDENT_AMBULATORY_CARE_PROVIDER_SITE_OTHER): Payer: Medicare Other | Admitting: Physician Assistant

## 2021-07-28 ENCOUNTER — Encounter: Payer: Self-pay | Admitting: Physician Assistant

## 2021-07-28 VITALS — BP 126/66 | HR 59 | Ht 63.0 in | Wt 164.6 lb

## 2021-07-28 DIAGNOSIS — R109 Unspecified abdominal pain: Secondary | ICD-10-CM | POA: Diagnosis not present

## 2021-07-28 DIAGNOSIS — K581 Irritable bowel syndrome with constipation: Secondary | ICD-10-CM

## 2021-07-28 NOTE — Progress Notes (Addendum)
Chief Complaint: Follow-up abdominal pain  HPI:    Mrs. Sabel is an 88 year old Caucasian female, known to Dr. Loletha Carrow, with a past medical history as listed below including GERD and osteoporosis, who returns to clinic today for follow-up of her abdominal pain.      09/03/2015 patient seen in clinic by Dr. Loletha Carrow for heme positive stool.  At that time scheduled for an EGD with possible dilation and colonoscopy.    09/24/2015 colonoscopy with diverticulosis in the sigmoid colon otherwise normal.  EGD with tortuous esophagus, small hiatal hernia normal and mild Schatzki's ring.    04/26/2021 CT of the abdomen pelvis with contrast for left lower quadrant pain with cortical atrophy and scarring of the left kidney some subsegmental atelectasis in the left lung base and no acute intra-abdominal or intrapelvic abnormalities.    04/26/2021 CMP with creatinine 1.36 and BUN 30 and otherwise normal.  CBC normal.    06/02/2021 patient seen in clinic with her daughter and describes 78 weeks of abdominal pain which is initially periumbilical and moved to the left lower quadrant and settled back in her periumbilical area.  Described episodes of constipation over that time.  As well as a lot of belching.  We discussed recent work-up including imaging and labs and start a fiber supplement as well as MiraLAX daily.  Also encouraged water intake and refilled her Dicyclomine to use 20 mg every 4-6 hours as needed for pain.  Also increase Famotidine to twice daily.  Dr. Loletha Carrow commented that he thought her abdominal pain is due to chronic constipation and left-sided diverticulosis.    Today, the patient tells me that since being seen in clinic last she has had 4 episodes of diarrhea which typically last for about a half a day, she is not exactly sure what brings these episodes on.  She has been using her MiraLAX once a day and Benefiber once a day in the morning as well as Dicyclomine 20 mg twice a day.  Tells me in general  she is feeling some better with no further sharp left-sided abdominal pains.  Describes that she is not sure what brings on these episodes of diarrhea but on other days her stool is soft and formed.  Does describe that she went to the beach for a week and did not take her MiraLAX or Benefiber and only had 1 episode of diarrhea while there after eating out.    Denies fever, chills, weight loss or blood in her stool.  Past Medical History:  Diagnosis Date   Cancer ([place])    melanoma l shoulder   GERD (gastroesophageal reflux disease)    Hyperlipidemia    Macular degeneration    Melanoma ([place])    Meniere's disease    Osteoporosis    osteopenia   Thyroid disease     Past Surgical History:  Procedure Laterality Date   AXILLARY NODE DISSECTION  1983   BUNIONECTOMY     CATARACT Bilateral    MELANOMA EXCISION     left shoulder    Current Outpatient Medications  Medication Sig Dispense Refill   ALPRAZolam (XANAX) 0.25 MG tablet Take 1 tablet (0.25 mg total) by mouth at bedtime as needed. for sleep 90 tablet 1   calcium-vitamin D (OSCAL WITH D) 500-200 MG-UNIT per tablet Take 1 tablet by mouth daily.     cholecalciferol (VITAMIN D3) 25 MCG (1000 UNIT) tablet Take 1,000 Units by mouth daily.     cyclobenzaprine (FLEXERIL) 10 MG tablet  Take 1 tablet (10 mg total) by mouth at bedtime. 30 tablet 1   dicyclomine (BENTYL) 20 MG tablet Take 1 tablet (20 mg total) by mouth every 6 (six) hours. 30 tablet 2   famotidine (PEPCID) 40 MG tablet Take 1 tablet (40 mg total) by mouth 2 (two) times daily. 60 tablet 5   ipratropium (ATROVENT) 0.06 % nasal spray Use two sprays in each nostril every 6 hours if needed to dry up nose 15 mL 11   latanoprost (XALATAN) 0.005 % ophthalmic solution Place 1 drop into both eyes nightly.     meclizine (ANTIVERT) 25 MG tablet Take 1 tablet (25 mg total) by mouth 3 (three) times daily as needed. 30 tablet 11   methimazole (TAPAZOLE) 5 MG tablet Take 0.5 tablets (2.5 mg  total) by mouth daily. 45 tablet 3   simvastatin (ZOCOR) 20 MG tablet TAKE 1 TABLET BY MOUTH DAILY AT 6 PM. 90 tablet 2   triamterene-hydrochlorothiazide (DYAZIDE) 37.5-25 MG capsule TAKE 1 CAPSULE BY MOUTH EVERY DAY 90 capsule 2   Vitamins-Lipotropics (LIPOFLAVONOID PO) Take 1 tablet by mouth 2 (two) times daily.     No current facility-administered medications for this visit.    Allergies as of 07/28/2021 - Review Complete 06/28/2021  Allergen Reaction Noted   Levaquin [levofloxacin in d5w] Other (See Comments) 12/09/2016    Family History  Problem Relation Age of Onset   Mental illness Mother    Hyperlipidemia Mother    Cancer Father    Asthma Father    Cancer Sister    Hypothyroidism Brother    Allergic rhinitis Neg Hx    Angioedema Neg Hx    Atopy Neg Hx    Eczema Neg Hx    Immunodeficiency Neg Hx    Urticaria Neg Hx     Social History   Socioeconomic History   Marital status: Widowed    Spouse name: Not on file   Number of children: 3   Years of education: Not on file   Highest education level: Not on file  Occupational History   Occupation: retired  Tobacco Use   Smoking status: Never   Smokeless tobacco: Never  Vaping Use   Vaping Use: Never used  Substance and Sexual Activity   Alcohol use: No    Alcohol/week: 0.0 standard drinks of alcohol   Drug use: No   Sexual activity: Not Currently  Other Topics Concern   Not on file  Social History Narrative   Not on file   Social Determinants of Health   Financial Resource Strain: Low Risk  (06/18/2018)   Overall Financial Resource Strain (CARDIA)    Difficulty of Paying Living Expenses: Not hard at all  Food Insecurity: No Food Insecurity (06/18/2018)   Hunger Vital Sign    Worried About Running Out of Food in the Last Year: Never true    [place] in the Last Year: Never true  Transportation Needs: No Transportation Needs (06/18/2018)   PRAPARE - Hydrologist (Medical):  No    Lack of Transportation (Non-Medical): No  Physical Activity: Insufficiently Active (06/18/2018)   Exercise Vital Sign    Days of Exercise per Week: 3 days    Minutes of Exercise per Session: 30 min  Stress: No Stress Concern Present (06/18/2018)   [place]    Feeling of Stress : Not at all  Social Connections: Moderately Isolated (06/18/2018)  Social Licensed conveyancer [NHANES]    Frequency of Communication with Friends and Family: Once a week    Frequency of Social Gatherings with Friends and Family: Once a week    Attends Religious Services: 1 to 4 times per year    Active Member of [employer] or Organizations: Yes    Attends Archivist Meetings: 1 to 4 times per year    Marital Status: Widowed  Intimate Partner Violence: Not At Risk (06/18/2018)   Humiliation, Afraid, Rape, and Kick questionnaire    Fear of Current or Ex-Partner: No    Emotionally Abused: No    Physically Abused: No    Sexually Abused: No    Review of Systems:    Constitutional: No weight loss, fever or chills Cardiovascular: No chest pain Respiratory: No SOB  Gastrointestinal: See HPI and otherwise negative   Physical Exam:  Vital signs: BP 126/66   Pulse (!) 59   Ht '5\' 3"'$  (1.6 m)   Wt 164 lb 9.6 oz (74.7 kg)   SpO2 99%   BMI 29.16 kg/m    Constitutional:   Pleasant Elderly Caucasian female appears to be in NAD, Well developed, Well nourished, alert and cooperative Respiratory: Respirations even and unlabored. Lungs clear to auscultation bilaterally.   No wheezes, crackles, or rhonchi.  Cardiovascular: Normal S1, S2. No MRG. Regular rate and rhythm. No peripheral edema, cyanosis or pallor.  Gastrointestinal:  Soft, nondistended, moderate left sided abdominal ttp to only light palpation, No rebound or guarding. Normal bowel sounds. No appreciable masses or hepatomegaly. Rectal:  Not performed.  Psychiatric:  Demonstrates good judgement and reason without abnormal affect or behaviors.  RELEVANT LABS AND IMAGING: CBC    Component Value Date/Time   WBC 3.8 06/22/2021 0933   RBC 4.45 06/22/2021 0933   HGB 13.0 06/22/2021 0933   HCT 39.7 06/22/2021 0933   PLT 251 06/22/2021 0933   MCV 89.2 06/22/2021 0933   MCV 84.8 05/07/2014 1115   MCH 29.2 06/22/2021 0933   MCHC 32.7 06/22/2021 0933   RDW 12.1 06/22/2021 0933   LYMPHSABS 1,554 06/22/2021 0933   MONOABS 0.5 02/27/2019 1351   EOSABS 99 06/22/2021 0933   BASOSABS 19 06/22/2021 0933    CMP     Component Value Date/Time   NA 143 06/22/2021 0933   K 4.0 06/22/2021 0933   CL 105 06/22/2021 0933   CO2 26 06/22/2021 0933   GLUCOSE 93 06/22/2021 0933   BUN 22 06/22/2021 0933   CREATININE 1.50 (H) 06/22/2021 0933   CALCIUM 9.7 06/22/2021 0933   PROT 6.0 (L) 06/22/2021 0933   ALBUMIN 3.8 06/07/2016 1107   AST 22 06/22/2021 0933   ALT 20 06/22/2021 0933   ALKPHOS 69 06/07/2016 1107   BILITOT 0.7 06/22/2021 0933   GFRNONAA 35 (L) 06/23/2020 1111   GFRAA 40 (L) 06/23/2020 1111    Assessment: 1.  Left-sided abdominal pain: No further sharp pains but does continue to be tender on the left side of her abdomen to light palpation; consider musculoskeletal etiology versus abdominal wall 2.  Constipation: Better with Benefiber and MiraLAX  Plan: 1.  Provided the patient with a copy of the low FODMAP diet so that she can review.  We discussed this in detail.  She is going to see if she can identify any trigger foods that bring on episodes of diarrhea. 2.  For now patient will continue to use the Dicyclomine, Benefiber and MiraLAX to adjust her bowel habits  and help with her left-sided pain.  We discussed how she can titrate these or use them in the future and what these products to do so that she can experiment a little at home to see if anything changes when she tries different regimens. 3.  Did explain that her pain may not to be GI, she has  had CT which does not show GI source and we are now treating her irritable bowel.  Recommend she talk about this further with her PCP.  It is very superficial on exam, may be musculoskeletal? 4.  Did briefly discuss Dicyclomine.  She seems to be doing very well on this medication.  Explained that ideally she would use this as needed as it can have some side effects in her age group. 5.  Patient will call if she has any questions or concerns in the future or if her symptoms become unmanageable.  Ellouise Newer, PA-C [facility] 07/28/2021, 10:34 AM  Cc: Elby Showers, MD

## 2021-07-28 NOTE — Patient Instructions (Signed)
Please follow a low "FODMAP" diet as attached to this after visit summary.  Follow up with gastroenterology as needed.  If you are age 86 or older, your body mass index should be between 23-30. Your Body mass index is 29.16 kg/m. If this is out of the aforementioned range listed, please consider follow up with your Primary Care Provider.  If you are age 49 or younger, your body mass index should be between 19-25. Your Body mass index is 29.16 kg/m. If this is out of the aformentioned range listed, please consider follow up with your Primary Care Provider.   ________________________________________________________  The [facility] providers would like to encourage you to use [facility] to communicate with providers for non-urgent requests or questions.  Due to long hold times on the telephone, sending your provider a message by [facility] may be a faster and more efficient way to get a response.  Please allow 48 business hours for a response.  Please remember that this is for non-urgent requests.  _______________________________________________________ Due to recent changes in healthcare laws, you may see the results of your imaging and laboratory studies on MyChart before your provider has had a chance to review them.  We understand that in some cases there may be results that are confusing or concerning to you. Not all laboratory results come back in the same time frame and the provider may be waiting for multiple results in order to interpret others.  Please give [place] 48 hours in order for your provider to thoroughly review all the results before contacting the office for clarification of your results.

## 2021-07-28 NOTE — Progress Notes (Signed)
Forwarding this note to Dr. Loletha Carrow, her primary gastroenterologist.

## 2021-07-29 ENCOUNTER — Ambulatory Visit: Payer: Medicare Other | Admitting: Physician Assistant

## 2021-08-02 DIAGNOSIS — H401131 Primary open-angle glaucoma, bilateral, mild stage: Secondary | ICD-10-CM | POA: Diagnosis not present

## 2021-08-02 NOTE — Progress Notes (Signed)
____________________________________________________________  Attending physician addendum:  Thank you for sending this case to me. I have reviewed the entire note and agree with the plan.  Given the reported symptoms, exam findings and testing thus far, perhaps there is a musculoskeletal component to this pain.  Could try stick-on heat or lidocaine patches (NSAID cream such as Voltaren should be avoided if possible at this age).  Wilfrid Lund, MD  ____________________________________________________________

## 2021-09-08 DIAGNOSIS — H61301 Acquired stenosis of right external ear canal, unspecified: Secondary | ICD-10-CM | POA: Diagnosis not present

## 2021-09-08 DIAGNOSIS — H6121 Impacted cerumen, right ear: Secondary | ICD-10-CM | POA: Diagnosis not present

## 2021-09-08 DIAGNOSIS — H903 Sensorineural hearing loss, bilateral: Secondary | ICD-10-CM | POA: Diagnosis not present

## 2021-09-08 DIAGNOSIS — Z8669 Personal history of other diseases of the nervous system and sense organs: Secondary | ICD-10-CM | POA: Diagnosis not present

## 2021-09-10 DIAGNOSIS — Z23 Encounter for immunization: Secondary | ICD-10-CM | POA: Diagnosis not present

## 2021-09-15 DIAGNOSIS — H35453 Secondary pigmentary degeneration, bilateral: Secondary | ICD-10-CM | POA: Diagnosis not present

## 2021-09-15 DIAGNOSIS — H353134 Nonexudative age-related macular degeneration, bilateral, advanced atrophic with subfoveal involvement: Secondary | ICD-10-CM | POA: Diagnosis not present

## 2021-09-15 DIAGNOSIS — H53413 Scotoma involving central area, bilateral: Secondary | ICD-10-CM | POA: Diagnosis not present

## 2021-09-15 DIAGNOSIS — Z961 Presence of intraocular lens: Secondary | ICD-10-CM | POA: Diagnosis not present

## 2021-09-15 DIAGNOSIS — H35363 Drusen (degenerative) of macula, bilateral: Secondary | ICD-10-CM | POA: Diagnosis not present

## 2021-10-11 DIAGNOSIS — Z23 Encounter for immunization: Secondary | ICD-10-CM | POA: Diagnosis not present

## 2021-10-13 DIAGNOSIS — H353114 Nonexudative age-related macular degeneration, right eye, advanced atrophic with subfoveal involvement: Secondary | ICD-10-CM | POA: Diagnosis not present

## 2021-10-27 DIAGNOSIS — H353124 Nonexudative age-related macular degeneration, left eye, advanced atrophic with subfoveal involvement: Secondary | ICD-10-CM | POA: Diagnosis not present

## 2021-11-03 DIAGNOSIS — L578 Other skin changes due to chronic exposure to nonionizing radiation: Secondary | ICD-10-CM | POA: Diagnosis not present

## 2021-11-03 DIAGNOSIS — L820 Inflamed seborrheic keratosis: Secondary | ICD-10-CM | POA: Diagnosis not present

## 2021-11-03 DIAGNOSIS — Z85828 Personal history of other malignant neoplasm of skin: Secondary | ICD-10-CM | POA: Diagnosis not present

## 2021-11-03 DIAGNOSIS — Z85820 Personal history of malignant melanoma of skin: Secondary | ICD-10-CM | POA: Diagnosis not present

## 2021-11-03 DIAGNOSIS — L821 Other seborrheic keratosis: Secondary | ICD-10-CM | POA: Diagnosis not present

## 2021-11-14 ENCOUNTER — Other Ambulatory Visit: Payer: Self-pay | Admitting: Internal Medicine

## 2021-12-01 DIAGNOSIS — H353114 Nonexudative age-related macular degeneration, right eye, advanced atrophic with subfoveal involvement: Secondary | ICD-10-CM | POA: Diagnosis not present

## 2021-12-08 DIAGNOSIS — H353124 Nonexudative age-related macular degeneration, left eye, advanced atrophic with subfoveal involvement: Secondary | ICD-10-CM | POA: Diagnosis not present

## 2022-01-18 ENCOUNTER — Encounter: Payer: Self-pay | Admitting: Internal Medicine

## 2022-01-18 ENCOUNTER — Ambulatory Visit (INDEPENDENT_AMBULATORY_CARE_PROVIDER_SITE_OTHER): Payer: Medicare Other | Admitting: Internal Medicine

## 2022-01-18 VITALS — BP 120/80 | HR 64 | Ht 63.0 in | Wt 163.0 lb

## 2022-01-18 DIAGNOSIS — E0590 Thyrotoxicosis, unspecified without thyrotoxic crisis or storm: Secondary | ICD-10-CM | POA: Diagnosis not present

## 2022-01-18 LAB — T4, FREE: Free T4: 0.93 ng/dL (ref 0.60–1.60)

## 2022-01-18 LAB — TSH: TSH: 1.01 u[IU]/mL (ref 0.35–5.50)

## 2022-01-18 NOTE — Progress Notes (Signed)
Name: Sabrina English  MRN/ DOB: [id number], 18-Jan-1933    Age/ Sex: 89 y.o., female     PCP: Elby Showers, MD   Reason for Endocrinology Evaluation: hyperthyroidism     Initial Endocrinology Clinic Visit: 02/27/2019    PATIENT IDENTIFIER: Sabrina English is a 89 y.o., female with a past medical history of hyperthyriodism, GERD, pulmonary nodules . She has followed with [facility] since 02/27/2019 for consultative assistance with management of her hyperthyroidism.   HISTORICAL SUMMARY: The patient was first diagnosed with hyperthyroidism in 2013, with a nadir of 0.20 uIU/mL in 2018  but was not started on any thionamides therapy until August 2020.  The reason for treatment was symptomatic and osteopenia   Mother with thyroid disease ( hypothyroidism )  SUBJECTIVE:    Today (01/18/2022):  [name] is here for a follow-up on hyperthyroidism.  Weight has been trending down  Denies local neck swelling  Has occasional  palpitations  Denies tremors  Denies loose stools or diarrhea  She has pain in the feet     Methimazole 5 mg , 1 tablet every other day   HISTORY:  Past Medical History:  Past Medical History:  Diagnosis Date   Cancer ([place])    melanoma l shoulder   GERD (gastroesophageal reflux disease)    Hyperlipidemia    Macular degeneration    Melanoma ([place])    Meniere's disease    Osteoporosis    osteopenia   Thyroid disease    Past Surgical History:  Past Surgical History:  Procedure Laterality Date   AXILLARY NODE DISSECTION  1983   BUNIONECTOMY     CATARACT Bilateral    MELANOMA EXCISION     left shoulder   Social History:  reports that she has never smoked. She has never used smokeless tobacco. She reports that she does not drink alcohol and does not use drugs. Family History:  Family History  Problem Relation Age of Onset   Mental illness Mother    Hyperlipidemia Mother    Cancer Father    Asthma Father     Cancer Sister    Hypothyroidism Brother    Allergic rhinitis Neg Hx    Angioedema Neg Hx    Atopy Neg Hx    Eczema Neg Hx    Immunodeficiency Neg Hx    Urticaria Neg Hx      HOME MEDICATIONS: Allergies as of 01/18/2022       Reactions   Levaquin [levofloxacin In D5w] Other (See Comments)   Lower extremity pain         Medication List        Accurate as of January 18, 2022  1:41 PM. If you have any questions, ask your nurse or doctor.          ALPRAZolam 0.25 MG tablet Commonly known as: XANAX Take 1 tablet (0.25 mg total) by mouth at bedtime as needed. for sleep   calcium-vitamin D 500-200 MG-UNIT tablet Commonly known as: OSCAL WITH D Take 1 tablet by mouth daily.   cholecalciferol 25 MCG (1000 UNIT) tablet Commonly known as: VITAMIN D3 Take 1,000 Units by mouth daily.   cyclobenzaprine 10 MG tablet Commonly known as: FLEXERIL Take 1 tablet (10 mg total) by mouth at bedtime.   dicyclomine 20 MG tablet Commonly known as: BENTYL Take 1 tablet (20 mg total) by mouth every 6 (six) hours.   famotidine 40 MG tablet Commonly known as: PEPCID Take 1  tablet (40 mg total) by mouth 2 (two) times daily. What changed: Another medication with the same name was removed. Continue taking this medication, and follow the directions you see here. Changed by: Dorita Sciara, MD   ipratropium 0.06 % nasal spray Commonly known as: ATROVENT Use two sprays in each nostril every 6 hours if needed to dry up nose   latanoprost 0.005 % ophthalmic solution Commonly known as: XALATAN Place 1 drop into both eyes nightly.   LIPOFLAVONOID PO Take 1 tablet by mouth 2 (two) times daily.   meclizine 25 MG tablet Commonly known as: ANTIVERT Take 1 tablet (25 mg total) by mouth 3 (three) times daily as needed.   methimazole 5 MG tablet Commonly known as: TAPAZOLE Take 0.5 tablets (2.5 mg total) by mouth daily.   simvastatin 20 MG tablet Commonly known as: ZOCOR TAKE 1  TABLET BY MOUTH DAILY AT 6 PM.   triamterene-hydrochlorothiazide 37.5-25 MG capsule Commonly known as: DYAZIDE TAKE 1 CAPSULE BY MOUTH EVERY DAY          OBJECTIVE:   PHYSICAL EXAM: VS: BP 120/80 (BP Location: Left Arm, Patient Position: Sitting, Cuff Size: Small)   Pulse 64   Ht '5\' 3"'$  (1.6 m)   Wt 163 lb (73.9 kg)   SpO2 98%   BMI 28.87 kg/m    EXAM: General: Pt appears well and is in NAD  Eyes: External eye exam normal without stare, lid lag or exophthalmos.  EOM intact.    Neck: General: Supple without adenopathy. Thyroid: Thyroid size normal.  No goiter or nodules appreciated.  Lungs: Clear with good BS bilat with no rales, rhonchi, or wheezes  Heart: Auscultation: RRR.  Abdomen: soft, nontender, without masses or organomegaly palpable  Extremities:  BL LE: No pretibial edema   Mental Status: Judgment, insight: Intact Orientation: Oriented to time, place, and person Mood and affect: No depression, anxiety, or agitation     DATA REVIEWED:    Latest Reference Range & Units 01/18/22 13:58  TSH 0.35 - 5.50 uIU/mL 1.01  T4,Free(Direct) 0.60 - 1.60 ng/dL 0.93    ASSESSMENT / PLAN / RECOMMENDATIONS:   Hyperthyroidism:   - She has been on methimazole without side effects  - She is clinically euthyroid  - No local neck symptoms  -TFTs normal Medications   Continue methimazole 5 mg every other day   F/U in 6 months   Signed electronically by: Mack Guise, MD  [facility]  [facility] [address] [place], [place] [address] Phone: [phone] FAX: [phone]      CC: Elby Showers, MD [address] [id number] Phone: [phone]  Fax: [phone]   Return to Endocrinology clinic as below: Future Appointments  Date Time Provider [place]  06/27/2022  9:10 AM M[facility] MJB-MJB MJB  07/01/2022  3:00 PM Baxley, Cresenciano Lick, MD MJB-MJB MJB

## 2022-01-19 DIAGNOSIS — H353114 Nonexudative age-related macular degeneration, right eye, advanced atrophic with subfoveal involvement: Secondary | ICD-10-CM | POA: Diagnosis not present

## 2022-01-19 MED ORDER — METHIMAZOLE 5 MG PO TABS: 5.0000 mg | ORAL_TABLET | ORAL | 3 refills | Status: DC

## 2022-01-26 DIAGNOSIS — H353124 Nonexudative age-related macular degeneration, left eye, advanced atrophic with subfoveal involvement: Secondary | ICD-10-CM | POA: Diagnosis not present

## 2022-02-14 DIAGNOSIS — Z961 Presence of intraocular lens: Secondary | ICD-10-CM | POA: Diagnosis not present

## 2022-02-14 DIAGNOSIS — H401131 Primary open-angle glaucoma, bilateral, mild stage: Secondary | ICD-10-CM | POA: Diagnosis not present

## 2022-03-08 DIAGNOSIS — H6122 Impacted cerumen, left ear: Secondary | ICD-10-CM | POA: Diagnosis not present

## 2022-03-08 DIAGNOSIS — H903 Sensorineural hearing loss, bilateral: Secondary | ICD-10-CM | POA: Diagnosis not present

## 2022-03-16 DIAGNOSIS — H353114 Nonexudative age-related macular degeneration, right eye, advanced atrophic with subfoveal involvement: Secondary | ICD-10-CM | POA: Diagnosis not present

## 2022-03-23 DIAGNOSIS — H353124 Nonexudative age-related macular degeneration, left eye, advanced atrophic with subfoveal involvement: Secondary | ICD-10-CM | POA: Diagnosis not present

## 2022-03-25 DIAGNOSIS — N189 Chronic kidney disease, unspecified: Secondary | ICD-10-CM | POA: Diagnosis not present

## 2022-03-25 DIAGNOSIS — N1832 Chronic kidney disease, stage 3b: Secondary | ICD-10-CM | POA: Diagnosis not present

## 2022-03-30 DIAGNOSIS — N2581 Secondary hyperparathyroidism of renal origin: Secondary | ICD-10-CM | POA: Diagnosis not present

## 2022-03-30 DIAGNOSIS — N1832 Chronic kidney disease, stage 3b: Secondary | ICD-10-CM | POA: Diagnosis not present

## 2022-03-30 DIAGNOSIS — D631 Anemia in chronic kidney disease: Secondary | ICD-10-CM | POA: Diagnosis not present

## 2022-04-21 ENCOUNTER — Other Ambulatory Visit: Payer: Self-pay | Admitting: Internal Medicine

## 2022-04-28 ENCOUNTER — Other Ambulatory Visit: Payer: Self-pay | Admitting: Internal Medicine

## 2022-05-11 DIAGNOSIS — H353114 Nonexudative age-related macular degeneration, right eye, advanced atrophic with subfoveal involvement: Secondary | ICD-10-CM | POA: Diagnosis not present

## 2022-05-13 ENCOUNTER — Other Ambulatory Visit: Payer: Self-pay | Admitting: Internal Medicine

## 2022-05-18 DIAGNOSIS — H353124 Nonexudative age-related macular degeneration, left eye, advanced atrophic with subfoveal involvement: Secondary | ICD-10-CM | POA: Diagnosis not present

## 2022-05-25 DIAGNOSIS — Z85820 Personal history of malignant melanoma of skin: Secondary | ICD-10-CM | POA: Diagnosis not present

## 2022-05-25 DIAGNOSIS — L821 Other seborrheic keratosis: Secondary | ICD-10-CM | POA: Diagnosis not present

## 2022-05-25 DIAGNOSIS — L03032 Cellulitis of left toe: Secondary | ICD-10-CM | POA: Diagnosis not present

## 2022-05-25 DIAGNOSIS — L03011 Cellulitis of right finger: Secondary | ICD-10-CM | POA: Diagnosis not present

## 2022-05-25 DIAGNOSIS — L603 Nail dystrophy: Secondary | ICD-10-CM | POA: Diagnosis not present

## 2022-05-25 DIAGNOSIS — Z85828 Personal history of other malignant neoplasm of skin: Secondary | ICD-10-CM | POA: Diagnosis not present

## 2022-05-25 DIAGNOSIS — D1801 Hemangioma of skin and subcutaneous tissue: Secondary | ICD-10-CM | POA: Diagnosis not present

## 2022-05-25 DIAGNOSIS — L218 Other seborrheic dermatitis: Secondary | ICD-10-CM | POA: Diagnosis not present

## 2022-05-25 DIAGNOSIS — L812 Freckles: Secondary | ICD-10-CM | POA: Diagnosis not present

## 2022-06-13 DIAGNOSIS — H903 Sensorineural hearing loss, bilateral: Secondary | ICD-10-CM | POA: Diagnosis not present

## 2022-06-13 DIAGNOSIS — H6121 Impacted cerumen, right ear: Secondary | ICD-10-CM | POA: Diagnosis not present

## 2022-06-13 DIAGNOSIS — Z974 Presence of external hearing-aid: Secondary | ICD-10-CM | POA: Diagnosis not present

## 2022-06-21 ENCOUNTER — Other Ambulatory Visit: Payer: Self-pay

## 2022-06-21 MED ORDER — METHIMAZOLE 5 MG PO TABS: ORAL_TABLET | ORAL | 1 refills | Status: DC

## 2022-06-24 NOTE — Progress Notes (Shared)
Annual Wellness Visit    Patient Care Team: Margaree Mackintosh, MD as PCP - General (Internal Medicine)  Visit Date: 07/01/22   Chief Complaint  Patient presents with   Medicare Wellness   Annual Exam    Subjective:   Patient: Sabrina English, Female    DOB: 02-14-1933, 89 y.o.   MRN: [id number]  RAEONNA GOODLOW is a 89 y.o. Female who presents today for her Annual Wellness Visit.  Had a fall recently in the night while visiting son in [place] when standing up in the bathroom where she fell backwards into the bathtub and now has a bruise on her left arm. Does not believe she lost consciousness.  History of insomnia/ anxiety treated with alprazolam 0.25 mg at bedtime as needed.  History of GERD treated with  famotidine 40 mg twice daily. Has dicyclomine for irritable bowel syndrome.  History of hyperlipidemia treated with simvastatin 20 mg daily at 6pm. Lipid panel normal.  History of dependent edema treated with triamterene-hydrochlorothiazide 37.5-25 mg daily.  Hx chronic kidney disease and is on iron supplement. Chronic kidney disease stage 3b and stable on yearly follow up.   Hx hyperthyroidism on methimazole per Dr. Everardo All.   Quit driving due to macular degeneration.  Had tetanus immunization in 2021.  History of GE reflux treated with PPI.  History of osteopenia.  History of chronic kidney disease with elevated creatinine.  History of Mnire's disease followed at [facility].  History of hearing loss.  Sometimes has esophageal spasm when she is eating food. Takes Dyazide for Mnire's disease which is longstanding.  Has meclizine on hand for vertigo if needed with Mnire's disease.  Takes calcium and vitamin D supplements for osteopenia.  Remote history of C7 radiculopathy currently not an issue.  Left bunionectomy 1996.  She had melanoma removed from her left shoulder 1992 and received immunotherapy by Dr. Leticia Penna at [facility].  She used to take Fosamax for osteopenia number of years ago but no longer does so.  History of bilateral sensorineural hearing loss.  Has bilateral impacted cerumen removed at [facility] due to stenosis of ear canals.  BUN elevated at 28. Creatinine elevated at 1.44. GFR low at 35. CBC normal. TSH at 0.78.  Mammogram last completed 07/19/21. No mammographic evidence of malignancy. Recommended repeat in 2024.  Colonoscopy last completed 09/24/15. Showed diverticulosis in sigmoid colon. Examination otherwise normal.    Social history: She is a widow.  3 adult children, 2 daughters and 1 son.  She is retired and previously worked as an Engineer, site.  Does not smoke or consume alcohol.  Family history: Father died at age 38 of prostate cancer.  Mother died at age 83 of Alzheimer's disease complications.  1 sister died of colon cancer at age 77.  1 brother in good health.  1 brother in fair health.  Daughter has been diagnosed with pulmonary sarcoidosis.  Past Medical History:  Diagnosis Date   Cancer (HCC)    melanoma l shoulder   GERD (gastroesophageal reflux disease)    Hyperlipidemia    Macular degeneration    Melanoma (HCC)    Meniere's disease    Osteoporosis    osteopenia   Thyroid disease      Family History  Problem Relation Age of Onset   Mental illness Mother    Hyperlipidemia Mother    Cancer Father    Asthma Father  Cancer Sister    Hypothyroidism Brother    Allergic rhinitis Neg Hx    Angioedema Neg Hx    Atopy Neg Hx    Eczema Neg Hx    Immunodeficiency Neg Hx    Urticaria Neg Hx          Review of Systems  Constitutional:  Negative for chills, fever, malaise/fatigue and weight loss.  HENT:  Negative for hearing loss, sinus pain and sore throat.   Respiratory:  Negative for cough, hemoptysis and shortness of breath.   Cardiovascular:  Negative for chest pain, palpitations, leg swelling and PND.  Gastrointestinal:   Negative for abdominal pain, constipation, diarrhea, heartburn, nausea and vomiting.  Genitourinary:  Negative for dysuria, frequency and urgency.  Musculoskeletal:  Negative for back pain, myalgias and neck pain.  Skin:  Negative for itching and rash.  Neurological:  Negative for dizziness, tingling, seizures and headaches.  Endo/Heme/Allergies:  Negative for polydipsia.  Psychiatric/Behavioral:  Negative for depression. The patient is not nervous/anxious.       Objective:   Vitals: BP 120/78   Pulse 72   Resp 18   Ht 5' 1.5" (1.562 m)   Wt 159 lb 12 oz (72.5 kg)   SpO2 96%   BMI 29.70 kg/m   Physical Exam Vitals and nursing note reviewed.  Constitutional:      General: She is not in acute distress.    Appearance: Normal appearance. She is not ill-appearing or toxic-appearing.  HENT:     Head: Normocephalic and atraumatic.     Right Ear: Hearing, tympanic membrane, ear canal and external ear normal.     Left Ear: Hearing, tympanic membrane, ear canal and external ear normal.     Mouth/Throat:     Pharynx: Oropharynx is clear.  Eyes:     Extraocular Movements: Extraocular movements intact.     Pupils: Pupils are equal, round, and reactive to light.  Neck:     Thyroid: No thyroid mass, thyromegaly or thyroid tenderness.     Vascular: No carotid bruit.  Cardiovascular:     Rate and Rhythm: Normal rate and regular rhythm. No extrasystoles are present.    Pulses:          Dorsalis pedis pulses are 1+ on the right side and 1+ on the left side.     Heart sounds: Normal heart sounds. No murmur heard.    No friction rub. No gallop.     Comments: Superficial varicosities. Pulmonary:     Effort: Pulmonary effort is normal.     Breath sounds: Normal breath sounds. No decreased breath sounds, wheezing, rhonchi or rales.  Chest:     Chest wall: No mass.  Abdominal:     Palpations: Abdomen is soft. There is no hepatomegaly, splenomegaly or mass.     Tenderness: There is no  abdominal tenderness.     Hernia: No hernia is present.  Musculoskeletal:     Cervical back: Normal range of motion.     Right lower leg: No edema.     Left lower leg: No edema.  Lymphadenopathy:     Cervical: No cervical adenopathy.     Upper Body:     Right upper body: No supraclavicular adenopathy.     Left upper body: No supraclavicular adenopathy.  Skin:    General: Skin is warm and dry.     Comments: Superficial bruise left arm size of 50 cent coin.  Neurological:     General: No focal deficit  present.     Mental Status: She is alert and oriented to person, place, and time. Mental status is at baseline.     Sensory: Sensation is intact.     Motor: Motor function is intact. No weakness.     Deep Tendon Reflexes: Reflexes are normal and symmetric.  Psychiatric:        Attention and Perception: Attention normal.        Mood and Affect: Mood normal.        Speech: Speech normal.        Behavior: Behavior normal.        Thought Content: Thought content normal.        Cognition and Memory: Cognition normal.        Judgment: Judgment normal.      Most recent functional status assessment:     No data to display         Most recent fall risk assessment:    07/01/2022    2:49 PM  Fall Risk   Falls in the past year? 1  Number falls in past yr: 1  Injury with Fall? 1  Risk for fall due to : Impaired balance/gait;History of fall(s)    Most recent depression screenings:    07/01/2022    2:49 PM 06/28/2021    3:05 PM  PHQ 2/9 Scores  PHQ - 2 Score 0 0   Most recent cognitive screening:    07/01/2022    2:49 PM  6CIT Screen  What Year? 0 points  What month? 0 points  What time? 0 points  Count back from 20 0 points  Months in reverse 0 points  Repeat phrase 0 points  Total Score 0 points     Results:   Studies obtained and personally reviewed by me:  Mammogram last completed 07/19/21. No mammographic evidence of malignancy. Recommended repeat in  2024.  Colonoscopy last completed 09/24/15. Showed diverticulosis in sigmoid colon. Examination otherwise normal.   Labs:       Component Value Date/Time   NA 143 06/27/2022 0924   K 4.1 06/27/2022 0924   CL 105 06/27/2022 0924   CO2 27 06/27/2022 0924   GLUCOSE 94 06/27/2022 0924   BUN 28 (H) 06/27/2022 0924   CREATININE 1.44 (H) 06/27/2022 0924   CALCIUM 10.1 06/27/2022 0924   PROT 6.2 06/27/2022 0924   ALBUMIN 3.8 06/07/2016 1107   AST 20 06/27/2022 0924   ALT 17 06/27/2022 0924   ALKPHOS 69 06/07/2016 1107   BILITOT 0.9 06/27/2022 0924   GFRNONAA 35 (L) 06/23/2020 1111   GFRAA 40 (L) 06/23/2020 1111     Lab Results  Component Value Date   WBC 4.1 06/27/2022   HGB 13.5 06/27/2022   HCT 39.6 06/27/2022   MCV 88.6 06/27/2022   PLT 291 06/27/2022    Lab Results  Component Value Date   CHOL 176 06/27/2022   HDL 86 06/27/2022   LDLCALC 73 06/27/2022   TRIG 90 06/27/2022   CHOLHDL 2.0 06/27/2022    No results found for: "HGBA1C"   Lab Results  Component Value Date   TSH 0.78 06/27/2022    Assessment & Plan:   Insomnia: treated with alprazolam 0.25 mg at bedtime as needed.  Allergies: treated with ipratropium 0.06% two sprays every 6 hours as needed to dry up nose. Refilled.  GERD: treated with dicyclomine 20 mg every 6 hours, famotidine 40 mg twice daily.  Hyperlipidemia: treated with simvastatin 20 mg daily at  6pm. Lipid panel normal.  Dependent edema: treated with triamterene-hydrochlorothiazide 37.5-25 mg daily.  Mnire's disease: treated with Dyazide. Meclizine on hand for vertigo. Refilled meclizine.  Osteopenia: takes calcium and vitamin D supplements.  Chronic kidney disease stage 3b: takes iron supplement.    Hyperthyroidism: taking methimazole per Dr. Everardo All.  Mammogram last completed 07/19/21. No mammographic evidence of malignancy. Recommended repeat in 2024.  Colonoscopy last completed 09/24/15. Showed diverticulosis in sigmoid colon.  Examination otherwise normal.   Vaccine counseling: UTD on flu, tetanus, shingles, pneumococcal 20, RSV vaccines.  Return in 1 year for health maintenance exam or as needed.     Annual wellness visit done today including the all of the following: Reviewed patient's Family Medical History Reviewed and updated list of patient's medical providers Assessment of cognitive impairment was done Assessed patient's functional ability Established a written schedule for health screening services Health Risk Assessent Completed and Reviewed  Discussed health benefits of physical activity, and encouraged her to engage in regular exercise appropriate for her age and condition.        I,Alexander Ruley,acting as a Neurosurgeon for Margaree Mackintosh, MD.,have documented all relevant documentation on the behalf of Margaree Mackintosh, MD,as directed by  Margaree Mackintosh, MD while in the presence of Margaree Mackintosh, MD.   I, Margaree Mackintosh, MD, have reviewed all documentation for this visit. The documentation on 07/02/22 for the exam, diagnosis, procedures, and orders are all accurate and complete.

## 2022-06-27 ENCOUNTER — Other Ambulatory Visit: Payer: Medicare Other

## 2022-06-27 DIAGNOSIS — E7849 Other hyperlipidemia: Secondary | ICD-10-CM | POA: Diagnosis not present

## 2022-06-27 DIAGNOSIS — N1832 Chronic kidney disease, stage 3b: Secondary | ICD-10-CM

## 2022-06-27 DIAGNOSIS — E0590 Thyrotoxicosis, unspecified without thyrotoxic crisis or storm: Secondary | ICD-10-CM

## 2022-06-28 LAB — COMPLETE METABOLIC PANEL WITH GFR
AG Ratio: 1.8 (calc) (ref 1.0–2.5)
ALT: 17 U/L (ref 6–29)
AST: 20 U/L (ref 10–35)
Albumin: 4 g/dL (ref 3.6–5.1)
Alkaline phosphatase (APISO): 82 U/L (ref 37–153)
BUN/Creatinine Ratio: 19 (calc) (ref 6–22)
BUN: 28 mg/dL — ABNORMAL HIGH (ref 7–25)
CO2: 27 mmol/L (ref 20–32)
Calcium: 10.1 mg/dL (ref 8.6–10.4)
Chloride: 105 mmol/L (ref 98–110)
Creat: 1.44 mg/dL — ABNORMAL HIGH (ref 0.60–0.95)
Globulin: 2.2 g/dL (calc) (ref 1.9–3.7)
Glucose, Bld: 94 mg/dL (ref 65–99)
Potassium: 4.1 mmol/L (ref 3.5–5.3)
Sodium: 143 mmol/L (ref 135–146)
Total Bilirubin: 0.9 mg/dL (ref 0.2–1.2)
Total Protein: 6.2 g/dL (ref 6.1–8.1)
eGFR: 35 mL/min/{1.73_m2} — ABNORMAL LOW (ref >=60)

## 2022-06-28 LAB — CBC WITH DIFFERENTIAL/PLATELET
Absolute Monocytes: 459 cells/uL (ref 200–950)
Basophils Absolute: 21 cells/uL (ref 0–200)
Basophils Relative: 0.5 %
Eosinophils Absolute: 131 cells/uL (ref 15–500)
Eosinophils Relative: 3.2 %
HCT: 39.6 % (ref 35.0–45.0)
Hemoglobin: 13.5 g/dL (ref 11.7–15.5)
Lymphs Abs: 1697 cells/uL (ref 850–3900)
MCH: 30.2 pg (ref 27.0–33.0)
MCHC: 34.1 g/dL (ref 32.0–36.0)
MCV: 88.6 fL (ref 80.0–100.0)
MPV: 9.8 fL (ref 7.5–12.5)
Monocytes Relative: 11.2 %
Neutro Abs: 1792 cells/uL (ref 1500–7800)
Neutrophils Relative %: 43.7 %
Platelets: 291 10*3/uL (ref 140–400)
RBC: 4.47 10*6/uL (ref 3.80–5.10)
RDW: 12.1 % (ref 11.0–15.0)
Total Lymphocyte: 41.4 %
WBC: 4.1 10*3/uL (ref 3.8–10.8)

## 2022-06-28 LAB — LIPID PANEL
Cholesterol: 176 mg/dL (ref <200)
HDL: 86 mg/dL (ref >=50)
LDL Cholesterol (Calc): 73 mg/dL (calc)
Non-HDL Cholesterol (Calc): 90 mg/dL (calc) (ref <130)
Total CHOL/HDL Ratio: 2 (calc) (ref <5.0)
Triglycerides: 90 mg/dL (ref <150)

## 2022-06-28 LAB — TSH: TSH: 0.78 mIU/L (ref 0.40–4.50)

## 2022-07-01 ENCOUNTER — Encounter: Payer: Self-pay | Admitting: Internal Medicine

## 2022-07-01 ENCOUNTER — Ambulatory Visit (INDEPENDENT_AMBULATORY_CARE_PROVIDER_SITE_OTHER): Payer: Medicare Other | Admitting: Internal Medicine

## 2022-07-01 VITALS — BP 120/78 | HR 72 | Resp 18 | Ht 61.5 in | Wt 159.8 lb

## 2022-07-01 DIAGNOSIS — Z8659 Personal history of other mental and behavioral disorders: Secondary | ICD-10-CM | POA: Diagnosis not present

## 2022-07-01 DIAGNOSIS — N1832 Chronic kidney disease, stage 3b: Secondary | ICD-10-CM | POA: Diagnosis not present

## 2022-07-01 DIAGNOSIS — J302 Other seasonal allergic rhinitis: Secondary | ICD-10-CM | POA: Diagnosis not present

## 2022-07-01 DIAGNOSIS — Z8719 Personal history of other diseases of the digestive system: Secondary | ICD-10-CM | POA: Diagnosis not present

## 2022-07-01 DIAGNOSIS — H903 Sensorineural hearing loss, bilateral: Secondary | ICD-10-CM | POA: Diagnosis not present

## 2022-07-01 DIAGNOSIS — H8109 Meniere's disease, unspecified ear: Secondary | ICD-10-CM

## 2022-07-01 DIAGNOSIS — Z85820 Personal history of malignant melanoma of skin: Secondary | ICD-10-CM

## 2022-07-01 DIAGNOSIS — H3530 Unspecified macular degeneration: Secondary | ICD-10-CM | POA: Diagnosis not present

## 2022-07-01 DIAGNOSIS — M8580 Other specified disorders of bone density and structure, unspecified site: Secondary | ICD-10-CM

## 2022-07-01 DIAGNOSIS — Z0000 Encounter for general adult medical examination without abnormal findings: Secondary | ICD-10-CM

## 2022-07-01 LAB — POCT URINALYSIS DIPSTICK
Bilirubin, UA: NEGATIVE
Blood, UA: NEGATIVE
Glucose, UA: NEGATIVE
Ketones, UA: NEGATIVE
Leukocytes, UA: NEGATIVE
Nitrite, UA: NEGATIVE
Protein, UA: NEGATIVE
Spec Grav, UA: 1.015 (ref 1.010–1.025)
Urobilinogen, UA: 0.2 E.U./dL
pH, UA: 5 (ref 5.0–8.0)

## 2022-07-01 MED ORDER — IPRATROPIUM BROMIDE 0.06 % NA SOLN: NASAL | 3 refills | Status: DC

## 2022-07-01 MED ORDER — MECLIZINE HCL 25 MG PO TABS: 25.0000 mg | ORAL_TABLET | Freq: Three times a day (TID) | ORAL | 11 refills | Status: DC | PRN

## 2022-07-01 NOTE — Patient Instructions (Signed)
  Sabrina English , Thank you for taking time to come for your Medicare Wellness Visit. I appreciate your ongoing commitment to your health goals. Please review the following plan we discussed and let me know if I can assist you in the future.   These are the goals we discussed:  Goals   None     This is a list of the screening recommended for you and due dates:  Health Maintenance  Topic Date Due   DEXA scan (bone density measurement)  07/11/2022   Mammogram  07/20/2022   Flu Shot  08/04/2022   Medicare Annual Wellness Visit  07/01/2023   DTaP/Tdap/Td vaccine (4 - Td or Tdap) 06/19/2029   Pneumonia Vaccine  Completed   COVID-19 Vaccine  Completed   Zoster (Shingles) Vaccine  Completed   HPV Vaccine  Aged Out    It was a pleasure to see you today.  Labs are stable.  Return in 1 year or as needed.

## 2022-07-06 DIAGNOSIS — H353114 Nonexudative age-related macular degeneration, right eye, advanced atrophic with subfoveal involvement: Secondary | ICD-10-CM | POA: Diagnosis not present

## 2022-07-13 DIAGNOSIS — H353124 Nonexudative age-related macular degeneration, left eye, advanced atrophic with subfoveal involvement: Secondary | ICD-10-CM | POA: Diagnosis not present

## 2022-07-26 DIAGNOSIS — Z1231 Encounter for screening mammogram for malignant neoplasm of breast: Secondary | ICD-10-CM | POA: Diagnosis not present

## 2022-07-26 LAB — HM MAMMOGRAPHY

## 2022-07-28 ENCOUNTER — Encounter: Payer: Self-pay | Admitting: Internal Medicine

## 2022-08-05 ENCOUNTER — Encounter: Payer: Self-pay | Admitting: Internal Medicine

## 2022-08-05 ENCOUNTER — Ambulatory Visit (INDEPENDENT_AMBULATORY_CARE_PROVIDER_SITE_OTHER): Payer: Medicare Other | Admitting: Internal Medicine

## 2022-08-05 VITALS — BP 120/78 | HR 64 | Ht 61.5 in | Wt 159.0 lb

## 2022-08-05 DIAGNOSIS — E0590 Thyrotoxicosis, unspecified without thyrotoxic crisis or storm: Secondary | ICD-10-CM | POA: Diagnosis not present

## 2022-08-05 LAB — T4, FREE: Free T4: 0.96 ng/dL (ref 0.60–1.60)

## 2022-08-05 LAB — TSH: TSH: 1.03 u[IU]/mL (ref 0.35–5.50)

## 2022-08-05 MED ORDER — METHIMAZOLE 5 MG PO TABS: 5.0000 mg | ORAL_TABLET | ORAL | 3 refills | Status: DC

## 2022-08-05 NOTE — Progress Notes (Signed)
Name: Sabrina English  MRN/ DOB: [id number], Mar 31, 1933    Age/ Sex: 89 y.o., female     PCP: Margaree Mackintosh, MD   Reason for Endocrinology Evaluation: hyperthyroidism     Initial Endocrinology Clinic Visit: 02/27/2019    PATIENT IDENTIFIER: Sabrina English is a 89 y.o., female with a past medical history of hyperthyriodism, GERD, pulmonary nodules . She has followed with [facility] since 02/27/2019 for consultative assistance with management of her hyperthyroidism.   HISTORICAL SUMMARY: The patient was first diagnosed with hyperthyroidism in 2013, with a nadir of 0.20 uIU/mL in 2018  but was not started on any thionamides therapy until August 2020.  The reason for treatment was symptomatic and osteopenia   Mother with thyroid disease ( hypothyroidism )  SUBJECTIVE:    Today (08/05/2022):  Sabrina English is here for a follow-up on hyperthyroidism.  She is accompanied by her daughter today.  Weight has been  stable  Denies local neck swelling  Denies palpitations  Has occasional tremors  Denies constipation or diarrhea      Methimazole 5 mg , 1 tablet every other day   HISTORY:  Past Medical History:  Past Medical History:  Diagnosis Date   Cancer (HCC)    melanoma l shoulder   GERD (gastroesophageal reflux disease)    Hyperlipidemia    Macular degeneration    Melanoma (HCC)    Meniere's disease    Osteoporosis    osteopenia   Thyroid disease    Past Surgical History:  Past Surgical History:  Procedure Laterality Date   AXILLARY NODE DISSECTION  1983   BUNIONECTOMY     CATARACT Bilateral    MELANOMA EXCISION     left shoulder   Social History:  reports that she has never smoked. She has never used smokeless tobacco. She reports that she does not drink alcohol and does not use drugs. Family History:  Family History  Problem Relation Age of Onset   Mental illness Mother    Hyperlipidemia Mother    Cancer Father    Asthma Father     Cancer Sister    Hypothyroidism Brother    Allergic rhinitis Neg Hx    Angioedema Neg Hx    Atopy Neg Hx    Eczema Neg Hx    Immunodeficiency Neg Hx    Urticaria Neg Hx      HOME MEDICATIONS: Allergies as of 08/05/2022       Reactions   Levaquin [levofloxacin In D5w] Other (See Comments)   Lower extremity pain         Medication List        Accurate as of August 05, 2022  4:02 PM. If you have any questions, ask your nurse or doctor.          ALPRAZolam 0.25 MG tablet Commonly known as: XANAX Take 1 tablet (0.25 mg total) by mouth at bedtime as needed. for sleep   calcium-vitamin D 500-200 MG-UNIT tablet Commonly known as: OSCAL WITH D Take 1 tablet by mouth daily.   cholecalciferol 25 MCG (1000 UNIT) tablet Commonly known as: VITAMIN D3 Take 1,000 Units by mouth daily.   cyclobenzaprine 10 MG tablet Commonly known as: FLEXERIL Take 1 tablet (10 mg total) by mouth at bedtime.   dicyclomine 20 MG tablet Commonly known as: BENTYL Take 1 tablet (20 mg total) by mouth every 6 (six) hours.   famotidine 40 MG tablet Commonly known as: PEPCID Take 1  tablet (40 mg total) by mouth 2 (two) times daily.   ferrous sulfate 325 (65 FE) MG EC tablet Take 325 mg by mouth daily with breakfast.   ipratropium 0.06 % nasal spray Commonly known as: ATROVENT Use two sprays in each nostril every 6 hours if needed to dry up nose   latanoprost 0.005 % ophthalmic solution Commonly known as: XALATAN Place 1 drop into both eyes nightly.   LIPOFLAVONOID PO Take 1 tablet by mouth 2 (two) times daily.   meclizine 25 MG tablet Commonly known as: ANTIVERT Take 1 tablet (25 mg total) by mouth 3 (three) times daily as needed.   methimazole 5 MG tablet Commonly known as: TAPAZOLE Take 1 tablet (5 mg total) by mouth as directed. ONE TABLET EVERY OTHER DAY What changed:  how much to take how to take this when to take this Changed by: Johnney Ou    simvastatin 20  MG tablet Commonly known as: ZOCOR TAKE 1 TABLET BY MOUTH DAILY AT 6 PM.   triamterene-hydrochlorothiazide 37.5-25 MG capsule Commonly known as: DYAZIDE TAKE 1 CAPSULE BY MOUTH EVERY DAY          OBJECTIVE:   PHYSICAL EXAM: VS: BP 120/78 (BP Location: Left Arm, Patient Position: Sitting, Cuff Size: Large)   Pulse 64   Ht 5' 1.5" (1.562 m)   Wt 159 lb (72.1 kg)   SpO2 93%   BMI 29.56 kg/m    EXAM: General: Pt appears well and is in NAD  Neck: General: Supple without adenopathy. Thyroid: Thyroid size normal.  No goiter or nodules appreciated.  Lungs: Clear with good BS bilat   Heart: Auscultation: RRR.  Extremities:  BL LE: No pretibial edema , patient with varicose veins bilaterally  Mental Status: Judgment, insight: Intact Orientation: Oriented to time, place, and person Mood and affect: No depression, anxiety, or agitation     DATA REVIEWED:  Latest Reference Range & Units 08/05/22 13:09  TSH 0.35 - 5.50 uIU/mL 1.03  T4,Free(Direct) 0.60 - 1.60 ng/dL 4.78      Latest Reference Range & Units 06/27/22 09:24  Sodium 135 - 146 mmol/L 143  Potassium 3.5 - 5.3 mmol/L 4.1  Chloride 98 - 110 mmol/L 105  CO2 20 - 32 mmol/L 27  Glucose 65 - 99 mg/dL 94  BUN 7 - 25 mg/dL 28 (H)  Creatinine 2.95 - 0.95 mg/dL 6.21 (H)  Calcium 8.6 - 10.4 mg/dL 30.8  BUN/Creatinine Ratio 6 - 22 (calc) 19  eGFR > OR = 60 mL/min/1.30m2 35 (L)  AG Ratio 1.0 - 2.5 (calc) 1.8  AST 10 - 35 U/L 20  ALT 6 - 29 U/L 17  Total Protein 6.1 - 8.1 g/dL 6.2  Total Bilirubin 0.2 - 1.2 mg/dL 0.9    ASSESSMENT / PLAN / RECOMMENDATIONS:   Hyperthyroidism:   - She has been on methimazole without side effects  - She is clinically euthyroid  - No local neck symptoms  -TFTs remain normal, continue current dose of methimazole    Medications   Continue methimazole 5 mg every other day   F/U in 6 months   Signed electronically by: Lyndle Herrlich, MD  [facility]  [facility] [address] [place], [place] [address] Phone: [phone] FAX: [phone]      CC: Margaree Mackintosh, MD [address] [place] [address] Phone: [phone]  Fax: [phone]   Return to Endocrinology clinic as below: Future Appointments  Date Time  Provider Department Center  02/10/2023  1:00 PM , Konrad Dolores, MD [facility] None  07/10/2023 10:00 AM [facility]  07/14/2023 11:00 AM Baxley, Luanna Cole, MD [facility]

## 2022-08-25 ENCOUNTER — Other Ambulatory Visit: Payer: Self-pay | Admitting: Internal Medicine

## 2022-08-26 DIAGNOSIS — H401131 Primary open-angle glaucoma, bilateral, mild stage: Secondary | ICD-10-CM | POA: Diagnosis not present

## 2022-08-26 DIAGNOSIS — H5340 Unspecified visual field defects: Secondary | ICD-10-CM | POA: Diagnosis not present

## 2022-08-31 DIAGNOSIS — H353114 Nonexudative age-related macular degeneration, right eye, advanced atrophic with subfoveal involvement: Secondary | ICD-10-CM | POA: Diagnosis not present

## 2022-09-07 DIAGNOSIS — H353124 Nonexudative age-related macular degeneration, left eye, advanced atrophic with subfoveal involvement: Secondary | ICD-10-CM | POA: Diagnosis not present

## 2022-09-09 ENCOUNTER — Other Ambulatory Visit: Payer: Self-pay | Admitting: Physician Assistant

## 2022-09-12 ENCOUNTER — Telehealth: Payer: Self-pay | Admitting: Physician Assistant

## 2022-09-12 NOTE — Telephone Encounter (Signed)
Returned call to patient's daughter Jasmine December. Jasmine December reports that patient is having (L) abdominal pain under her breast but before her umbilical line. Pt is not able to tell if she is constipated or not, I advised Jasmine December to clarify if patient is having meaningful bowel movements. If not that could be causing her pain. Bentyl refill was approved and pt has restarted that today along with gas medicine for possible gas pains. So far this regimen is working. I advised Jasmine December that pt should continue medication regimen at this time, if she finds that pt is constipated she should restart Miralax and Benefiber as recommended last year. Keep f/u in November as scheduled. Jasmine December will contact us in the interim if patient does not improve. Jasmine December verbalized understanding and had no concerns at the end of the call.

## 2022-09-12 NOTE — Telephone Encounter (Signed)
Inbound call from patient's daughter stating patient has been experiencing left side abdominal pain. States it comes and goes but keeps her up at night sometime. Patient is scheduled for 11/21. Patient's daughter requesting a call back to discuss what could help in the meantime. Please advise, thank you.

## 2022-09-14 DIAGNOSIS — H903 Sensorineural hearing loss, bilateral: Secondary | ICD-10-CM | POA: Diagnosis not present

## 2022-09-14 DIAGNOSIS — H6123 Impacted cerumen, bilateral: Secondary | ICD-10-CM | POA: Diagnosis not present

## 2022-09-19 DIAGNOSIS — Z23 Encounter for immunization: Secondary | ICD-10-CM | POA: Diagnosis not present

## 2022-10-05 DIAGNOSIS — Z23 Encounter for immunization: Secondary | ICD-10-CM | POA: Diagnosis not present

## 2022-10-17 ENCOUNTER — Other Ambulatory Visit: Payer: Self-pay | Admitting: Internal Medicine

## 2022-10-26 DIAGNOSIS — H353114 Nonexudative age-related macular degeneration, right eye, advanced atrophic with subfoveal involvement: Secondary | ICD-10-CM | POA: Diagnosis not present

## 2022-11-02 DIAGNOSIS — H353124 Nonexudative age-related macular degeneration, left eye, advanced atrophic with subfoveal involvement: Secondary | ICD-10-CM | POA: Diagnosis not present

## 2022-11-24 ENCOUNTER — Ambulatory Visit: Payer: Medicare Other | Admitting: Physician Assistant

## 2022-11-24 ENCOUNTER — Encounter: Payer: Self-pay | Admitting: Physician Assistant

## 2022-11-24 VITALS — BP 118/74 | HR 71 | Ht 61.5 in | Wt 160.1 lb

## 2022-11-24 DIAGNOSIS — K5900 Constipation, unspecified: Secondary | ICD-10-CM

## 2022-11-24 DIAGNOSIS — R142 Eructation: Secondary | ICD-10-CM

## 2022-11-24 DIAGNOSIS — R0989 Other specified symptoms and signs involving the circulatory and respiratory systems: Secondary | ICD-10-CM

## 2022-11-24 DIAGNOSIS — R1084 Generalized abdominal pain: Secondary | ICD-10-CM | POA: Diagnosis not present

## 2022-11-24 MED ORDER — DICYCLOMINE HCL 20 MG PO TABS: 20.0000 mg | ORAL_TABLET | Freq: Every day | ORAL | 3 refills | Status: DC

## 2022-11-24 MED ORDER — FAMOTIDINE 20 MG PO TABS: 20.0000 mg | ORAL_TABLET | Freq: Two times a day (BID) | ORAL | 3 refills | Status: DC

## 2022-11-24 NOTE — Progress Notes (Signed)
Chief Complaint: Abdominal pain and GERD  HPI:    Sabrina English is an 89 year old female with a past medical history as listed below including reflux, melanoma and osteoporosis, known to Dr. Myrtie Neither, who presents to clinic today with a complaint of epigastric pain and some reflux.    09/03/2015 patient seen in clinic by Dr. Myrtie Neither for heme positive stool.  At that time scheduled for an EGD with possible dilation and colonoscopy.    09/24/2015 colonoscopy with diverticulosis in the sigmoid colon otherwise normal.  EGD with tortuous esophagus, small hiatal hernia normal and mild Schatzki's ring.    04/26/2021 CT of the abdomen pelvis with contrast for left lower quadrant pain with cortical atrophy and scarring of the left kidney some subsegmental atelectasis in the left lung base and no acute intra-abdominal or intrapelvic abnormalities.    04/26/2021 CMP with creatinine 1.36 and BUN 30 and otherwise normal.  CBC normal.    06/02/2021 patient seen in clinic with her daughter and describes 7-8 weeks of abdominal pain which is initially periumbilical and moved to the left lower quadrant and settled back in her periumbilical area.  Described episodes of constipation over that time.  As well as a lot of belching.  We discussed recent work-up including imaging and labs and start a fiber supplement as well as MiraLAX daily.  Also encouraged water intake and refilled her Dicyclomine to use 20 mg every 4-6 hours as needed for pain.  Also increase Famotidine to twice daily.  Dr. Myrtie Neither commented that he thought her abdominal pain is due to chronic constipation and left-sided diverticulosis.    07/28/2021 patient described occasional diarrhea continued on MiraLAX and Benefiber.  Was using Dicyclomine 20 twice daily and felt some better.  At that time given a copy of the low FODMAP diet.  Continued Dicyclomine, Benefiber and MiraLAX.  Discussed the possibility of musculoskeletal etiology for pain.    06/27/2022 CMP with a  creatinine of 1.44, BUN elevated 28.  CBC normal.  TSH normal.    09/12/2022 patient's daughter called and describes left abdominal pain up under her breast.  Bentyl refill was approved and patient had restarted with gas medicine.  Also discussed restarting MiraLAX and Benefiber.    Today, the patient and her daughter presents to clinic, they explain that back in August she was having an acute episode of epigastric/left upper quadrant pain.  At the time was off of her Dicyclomine completely and was not using her fiber.  She started taking Benefiber every other day and restarted Dicyclomine once daily and the pain went away.  Bowel movements are currently regular.  Patient does not like using Benefiber every day because it sometimes gives her diarrhea so she typically takes it every other day.    Patient's biggest complaint today is of eructations and some throat clearing.  Apparently she has only been using Famotidine 20 mg once daily in the morning instead of her usual 40 mg twice a day.    Denies fever, chills or weight loss.  Past Medical History:  Diagnosis Date   Cancer (HCC)    melanoma l shoulder   GERD (gastroesophageal reflux disease)    Hyperlipidemia    Macular degeneration    Melanoma (HCC)    Meniere's disease    Osteoporosis    osteopenia   Thyroid disease     Past Surgical History:  Procedure Laterality Date   AXILLARY NODE DISSECTION  1983   BUNIONECTOMY  CATARACT Bilateral    MELANOMA EXCISION     left shoulder    Current Outpatient Medications  Medication Sig Dispense Refill   ALPRAZolam (XANAX) 0.25 MG tablet Take 1 tablet (0.25 mg total) by mouth at bedtime as needed. for sleep 90 tablet 1   calcium-vitamin D (OSCAL WITH D) 500-200 MG-UNIT per tablet Take 1 tablet by mouth daily.     cholecalciferol (VITAMIN D3) 25 MCG (1000 UNIT) tablet Take 1,000 Units by mouth daily.     cyclobenzaprine (FLEXERIL) 10 MG tablet Take 1 tablet (10 mg total) by mouth at bedtime.  30 tablet 1   dicyclomine (BENTYL) 20 MG tablet TAKE 1 TABLET BY MOUTH EVERY 6 HOURS. 30 tablet 2   famotidine (PEPCID) 40 MG tablet Take 1 tablet (40 mg total) by mouth 2 (two) times daily. 60 tablet 5   ferrous sulfate 325 (65 FE) MG EC tablet Take 325 mg by mouth daily with breakfast.     ipratropium (ATROVENT) 0.06 % nasal spray USE 2 SPRAYS IN BOTH NOSTRILS  EVERY 6 HOURS IF NEEDED TO DRY  UP NOSE 135 mL 3   latanoprost (XALATAN) 0.005 % ophthalmic solution Place 1 drop into both eyes nightly.     meclizine (ANTIVERT) 25 MG tablet Take 1 tablet (25 mg total) by mouth 3 (three) times daily as needed. 30 tablet 11   methimazole (TAPAZOLE) 5 MG tablet Take 1 tablet (5 mg total) by mouth as directed. ONE TABLET EVERY OTHER DAY 45 tablet 3   simvastatin (ZOCOR) 20 MG tablet TAKE 1 TABLET BY MOUTH DAILY AT 6 PM. 90 tablet 2   triamterene-hydrochlorothiazide (DYAZIDE) 37.5-25 MG capsule TAKE 1 CAPSULE BY MOUTH EVERY DAY 90 capsule 3   Vitamins-Lipotropics (LIPOFLAVONOID PO) Take 1 tablet by mouth 2 (two) times daily.     No current facility-administered medications for this visit.    Allergies as of 11/24/2022 - Review Complete 11/24/2022  Allergen Reaction Noted   Levaquin [levofloxacin in d5w] Other (See Comments) 12/09/2016    Family History  Problem Relation Age of Onset   Mental illness Mother    Hyperlipidemia Mother    Cancer Father    Asthma Father    Cancer Sister    Hypothyroidism Brother    Allergic rhinitis Neg Hx    Angioedema Neg Hx    Atopy Neg Hx    Eczema Neg Hx    Immunodeficiency Neg Hx    Urticaria Neg Hx     Social History   Socioeconomic History   Marital status: Widowed    Spouse name: Not on file   Number of children: 3   Years of education: Not on file   Highest education level: Not on file  Occupational History   Occupation: retired  Tobacco Use   Smoking status: Never   Smokeless tobacco: Never  Vaping Use   Vaping status: Never Used   Substance and Sexual Activity   Alcohol use: No    Alcohol/week: 0.0 standard drinks of alcohol   Drug use: No   Sexual activity: Not Currently  Other Topics Concern   Not on file  Social History Narrative   Not on file   Social Determinants of Health   Financial Resource Strain: Low Risk  (06/18/2018)   Overall Financial Resource Strain (CARDIA)    Difficulty of Paying Living Expenses: Not hard at all  Food Insecurity: Low Risk  (09/14/2022)   Received from [facility]   Hunger Vital Sign  Worried About Programme researcher, broadcasting/film/video in the Last Year: Never true    Ran Out of Food in the Last Year: Never true  Transportation Needs: No Transportation Needs (09/14/2022)   Received from [facility]    In the past 12 months, has lack of reliable transportation kept you from medical appointments, meetings, work or from getting things needed for daily living? : No  Physical Activity: Insufficiently Active (06/18/2018)   Exercise Vital Sign    Days of Exercise per Week: 3 days    Minutes of Exercise per Session: 30 min  Stress: No Stress Concern Present (06/18/2018)   [employer] of Occupational Health - Occupational Stress Questionnaire    Feeling of Stress : Not at all  Social Connections: Moderately Isolated (06/18/2018)   Social Connection and Isolation Panel [NHANES]    Frequency of Communication with Friends and Family: Once a week    Frequency of Social Gatherings with Friends and Family: Once a week    Attends Religious Services: 1 to 4 times per year    Active Member of [employer] or Organizations: Yes    Attends Banker Meetings: 1 to 4 times per year    Marital Status: Widowed  Intimate Partner Violence: Not At Risk (06/18/2018)   Humiliation, Afraid, Rape, and Kick questionnaire    Fear of Current or Ex-Partner: No    Emotionally Abused: No    Physically Abused: No    Sexually Abused: No    Review of Systems:    Constitutional: No weight  loss, fever or chills Cardiovascular: No chest pain Respiratory: No SOB  Gastrointestinal: See HPI and otherwise negative   Physical Exam:  Vital signs: BP 118/74   Pulse 71   Ht 5' 1.5" (1.562 m)   Wt 160 lb 2 oz (72.6 kg)   SpO2 96%   BMI 29.77 kg/m    Constitutional:   Pleasant Elderly Caucasian female appears to be in NAD, Well developed, Well nourished, alert and cooperative Respiratory: Respirations even and unlabored. Lungs clear to auscultation bilaterally.   No wheezes, crackles, or rhonchi.  Cardiovascular: Normal S1, S2. No MRG. Regular rate and rhythm. No peripheral edema, cyanosis or pallor.  Gastrointestinal:  Soft, nondistended, nontender. No rebound or guarding. Normal bowel sounds. No appreciable masses or hepatomegaly. Rectal:  Not performed.  Psychiatric: Demonstrates good judgement and reason without abnormal affect or behaviors.  RELEVANT LABS AND IMAGING: CBC    Component Value Date/Time   WBC 4.1 06/27/2022 0924   RBC 4.47 06/27/2022 0924   HGB 13.5 06/27/2022 0924   HCT 39.6 06/27/2022 0924   PLT 291 06/27/2022 0924   MCV 88.6 06/27/2022 0924   MCV 84.8 05/07/2014 1115   MCH 30.2 06/27/2022 0924   MCHC 34.1 06/27/2022 0924   RDW 12.1 06/27/2022 0924   LYMPHSABS 1,697 06/27/2022 0924   MONOABS 0.5 02/27/2019 1351   EOSABS 131 06/27/2022 0924   BASOSABS 21 06/27/2022 0924    CMP     Component Value Date/Time   NA 143 06/27/2022 0924   K 4.1 06/27/2022 0924   CL 105 06/27/2022 0924   CO2 27 06/27/2022 0924   GLUCOSE 94 06/27/2022 0924   BUN 28 (H) 06/27/2022 0924   CREATININE 1.44 (H) 06/27/2022 0924   CALCIUM 10.1 06/27/2022 0924   PROT 6.2 06/27/2022 0924   ALBUMIN 3.8 06/07/2016 1107   AST 20 06/27/2022 0924   ALT 17 06/27/2022 0924   ALKPHOS 69  06/07/2016 1107   BILITOT 0.9 06/27/2022 0924   GFRNONAA 35 (L) 06/23/2020 1111   GFRAA 40 (L) 06/23/2020 1111    Assessment: 1.  Eructation/throat clearing: Increased when patient  decreased her Famotidine, likely related to reflux/gastritis 2.  IBS-C: Does well with Benefiber and MiraLAX as needed but does have some generalized abdominal pain at times cleared up with Dicyclomine  Plan: 1.  We had a discussion about patient's abdominal pain that seems to increase in severity at times.  At those times would recommend that she is taking Dicyclomine at least 20 mg once a day, up to twice a day if needed as well as Benefiber more regularly and ensuring that she is having regular bowel movements.  Adding MiraLAX if needed.  It would be concerning if the patient was not passing any gas or stool and abdominal pain increased in intensity with nausea and vomiting.  They would need to call and let us know. 2.  Patient had seemingly decreased her Famotidine drastically.  Will increase back to 40 mg twice a day and hopefully this will help with her throat clearing and eructations.  We did send a new prescription to [facility] Famotidine 40 mg twice daily #180 with 3 refills. 3.  Also refilled Dicyclomine 20 mg daily #90 with 3 refills sent to [facility] to hopefully get it cheaper for her. 4.  Patient to follow in clinic in February if needed.  We did make an appointment and she will call and cancel if feeling well.  Sabrina Meeker, PA-C [facility] 11/24/2022, 10:02 AM  Cc: Sabrina Mackintosh, MD

## 2022-11-24 NOTE — Patient Instructions (Addendum)
We have sent the following medications to your pharmacy for you to pick up at your convenience:  Dicyclomine, Famotidine.  _______________________________________________________  If your blood pressure at your visit was 140/90 or greater, please contact your primary care physician to follow up on this.  _______________________________________________________  If you are age 87 or older, your body mass index should be between 23-30. Your Body mass index is 29.77 kg/m. If this is out of the aforementioned range listed, please consider follow up with your Primary Care Provider.  If you are age 54 or younger, your body mass index should be between 19-25. Your Body mass index is 29.77 kg/m. If this is out of the aformentioned range listed, please consider follow up with your Primary Care Provider.   ________________________________________________________  The [facility] providers would like to encourage you to use [facility] to communicate with providers for non-urgent requests or questions.  Due to long hold times on the telephone, sending your provider a message by [facility] may be a faster and more efficient way to get a response.  Please allow 48 business hours for a response.  Please remember that this is for non-urgent requests.  _______________________________________________________

## 2022-11-25 NOTE — Progress Notes (Signed)
____________________________________________________________  Attending physician addendum:  Thank you for sending this case to me. I have reviewed the entire note and agree with the plan.  Just be cautious with increasing dose of dicyclomine in elderly patient for possibility of anticholinergic side effects.   It is not clear the throat clearing and belching are reflux, but the symptoms sound benign.  Amada Jupiter, MD  ____________________________________________________________

## 2022-12-05 ENCOUNTER — Other Ambulatory Visit: Payer: Self-pay

## 2022-12-05 MED ORDER — SIMVASTATIN 20 MG PO TABS: ORAL_TABLET | ORAL | 2 refills | Status: DC

## 2022-12-06 ENCOUNTER — Other Ambulatory Visit: Payer: Self-pay | Admitting: Internal Medicine

## 2022-12-06 MED ORDER — SIMVASTATIN 20 MG PO TABS: ORAL_TABLET | ORAL | 2 refills | Status: DC

## 2022-12-06 NOTE — Telephone Encounter (Signed)
Copied from CRM [id number]. Topic: Clinical - Medication Refill >> Dec 06, 2022 11:44 AM Dimitri Ped wrote: Most Recent Primary Care Visit:  Provider: Margaree Mackintosh  Department: [facility]  Visit Type: MEDICARE WELL VISIT 45  Date: 07/01/2022  Medication:simvastatin (ZOCOR) 20 MG tablet  Has the patient contacted their pharmacy? [facility] and told her they have not got the ok for prescription (Agent: If no, request that the patient contact the pharmacy for the refill. If patient does not wish to contact the pharmacy document the reason why and proceed with request.) (Agent: If yes, when and what did the pharmacy advise?)  Is this the correct pharmacy for this prescription? Yes If no, delete pharmacy and type the correct one.  This is the patient's preferred pharmacy:  [facility] - [place], [place] - [address] [place] [address] Phone: [phone] Fax: [phone]   Has the prescription been filled recently? No (90 day supply)  Is the patient out of the medication? No 2 days left  Has the patient been seen for an appointment in the last year OR does the patient have an upcoming appointment? Yes  Can we respond through MyChart? Yes  Agent: Please be advised that Rx refills may take up to 3 business days. We ask that you follow-up with your pharmacy.

## 2022-12-14 DIAGNOSIS — H6121 Impacted cerumen, right ear: Secondary | ICD-10-CM | POA: Diagnosis not present

## 2022-12-14 DIAGNOSIS — H353114 Nonexudative age-related macular degeneration, right eye, advanced atrophic with subfoveal involvement: Secondary | ICD-10-CM | POA: Diagnosis not present

## 2022-12-14 DIAGNOSIS — H903 Sensorineural hearing loss, bilateral: Secondary | ICD-10-CM | POA: Diagnosis not present

## 2022-12-21 DIAGNOSIS — H353124 Nonexudative age-related macular degeneration, left eye, advanced atrophic with subfoveal involvement: Secondary | ICD-10-CM | POA: Diagnosis not present

## 2023-01-07 ENCOUNTER — Other Ambulatory Visit: Payer: Self-pay

## 2023-01-07 ENCOUNTER — Encounter (HOSPITAL_COMMUNITY): Payer: Self-pay | Admitting: Emergency Medicine

## 2023-01-07 ENCOUNTER — Emergency Department (HOSPITAL_COMMUNITY): Payer: Medicare Other

## 2023-01-07 ENCOUNTER — Inpatient Hospital Stay (HOSPITAL_COMMUNITY)
Admission: EM | Admit: 2023-01-07 | Discharge: 2023-01-13 | DRG: 193 | Disposition: A | Discharge status: Skilled Nursing Facility | Payer: Medicare Other | Attending: Internal Medicine | Admitting: Internal Medicine

## 2023-01-07 DIAGNOSIS — Z85820 Personal history of malignant melanoma of skin: Secondary | ICD-10-CM

## 2023-01-07 DIAGNOSIS — R7401 Elevation of levels of liver transaminase levels: Secondary | ICD-10-CM | POA: Diagnosis present

## 2023-01-07 DIAGNOSIS — R41841 Cognitive communication deficit: Secondary | ICD-10-CM | POA: Diagnosis not present

## 2023-01-07 DIAGNOSIS — Z818 Family history of other mental and behavioral disorders: Secondary | ICD-10-CM

## 2023-01-07 DIAGNOSIS — E785 Hyperlipidemia, unspecified: Secondary | ICD-10-CM | POA: Diagnosis not present

## 2023-01-07 DIAGNOSIS — H3530 Unspecified macular degeneration: Secondary | ICD-10-CM | POA: Diagnosis present

## 2023-01-07 DIAGNOSIS — R0902 Hypoxemia: Secondary | ICD-10-CM | POA: Diagnosis not present

## 2023-01-07 DIAGNOSIS — Z66 Do not resuscitate: Secondary | ICD-10-CM | POA: Diagnosis present

## 2023-01-07 DIAGNOSIS — E7849 Other hyperlipidemia: Secondary | ICD-10-CM | POA: Diagnosis not present

## 2023-01-07 DIAGNOSIS — M810 Age-related osteoporosis without current pathological fracture: Secondary | ICD-10-CM | POA: Diagnosis present

## 2023-01-07 DIAGNOSIS — E0590 Thyrotoxicosis, unspecified without thyrotoxic crisis or storm: Secondary | ICD-10-CM | POA: Diagnosis present

## 2023-01-07 DIAGNOSIS — J189 Pneumonia, unspecified organism: Principal | ICD-10-CM | POA: Diagnosis present

## 2023-01-07 DIAGNOSIS — R262 Difficulty in walking, not elsewhere classified: Secondary | ICD-10-CM | POA: Diagnosis not present

## 2023-01-07 DIAGNOSIS — K219 Gastro-esophageal reflux disease without esophagitis: Secondary | ICD-10-CM | POA: Diagnosis present

## 2023-01-07 DIAGNOSIS — Z7401 Bed confinement status: Secondary | ICD-10-CM | POA: Diagnosis not present

## 2023-01-07 DIAGNOSIS — D631 Anemia in chronic kidney disease: Secondary | ICD-10-CM | POA: Diagnosis present

## 2023-01-07 DIAGNOSIS — Z79899 Other long term (current) drug therapy: Secondary | ICD-10-CM | POA: Diagnosis not present

## 2023-01-07 DIAGNOSIS — R918 Other nonspecific abnormal finding of lung field: Secondary | ICD-10-CM | POA: Diagnosis not present

## 2023-01-07 DIAGNOSIS — F419 Anxiety disorder, unspecified: Secondary | ICD-10-CM | POA: Diagnosis present

## 2023-01-07 DIAGNOSIS — E871 Hypo-osmolality and hyponatremia: Secondary | ICD-10-CM | POA: Diagnosis present

## 2023-01-07 DIAGNOSIS — M4696 Unspecified inflammatory spondylopathy, lumbar region: Secondary | ICD-10-CM | POA: Diagnosis not present

## 2023-01-07 DIAGNOSIS — Z825 Family history of asthma and other chronic lower respiratory diseases: Secondary | ICD-10-CM | POA: Diagnosis not present

## 2023-01-07 DIAGNOSIS — J168 Pneumonia due to other specified infectious organisms: Secondary | ICD-10-CM | POA: Diagnosis not present

## 2023-01-07 DIAGNOSIS — I129 Hypertensive chronic kidney disease with stage 1 through stage 4 chronic kidney disease, or unspecified chronic kidney disease: Secondary | ICD-10-CM | POA: Diagnosis present

## 2023-01-07 DIAGNOSIS — M6259 Muscle wasting and atrophy, not elsewhere classified, multiple sites: Secondary | ICD-10-CM | POA: Diagnosis not present

## 2023-01-07 DIAGNOSIS — Z1152 Encounter for screening for COVID-19: Secondary | ICD-10-CM

## 2023-01-07 DIAGNOSIS — E876 Hypokalemia: Secondary | ICD-10-CM | POA: Diagnosis present

## 2023-01-07 DIAGNOSIS — Z83438 Family history of other disorder of lipoprotein metabolism and other lipidemia: Secondary | ICD-10-CM | POA: Diagnosis not present

## 2023-01-07 DIAGNOSIS — I509 Heart failure, unspecified: Secondary | ICD-10-CM | POA: Diagnosis not present

## 2023-01-07 DIAGNOSIS — R0602 Shortness of breath: Secondary | ICD-10-CM | POA: Diagnosis not present

## 2023-01-07 DIAGNOSIS — Z881 Allergy status to other antibiotic agents status: Secondary | ICD-10-CM

## 2023-01-07 DIAGNOSIS — N1832 Chronic kidney disease, stage 3b: Secondary | ICD-10-CM | POA: Diagnosis present

## 2023-01-07 DIAGNOSIS — R748 Abnormal levels of other serum enzymes: Secondary | ICD-10-CM | POA: Diagnosis not present

## 2023-01-07 DIAGNOSIS — I700 Atherosclerosis of aorta: Secondary | ICD-10-CM | POA: Diagnosis not present

## 2023-01-07 DIAGNOSIS — J99 Respiratory disorders in diseases classified elsewhere: Secondary | ICD-10-CM | POA: Diagnosis not present

## 2023-01-07 DIAGNOSIS — J9601 Acute respiratory failure with hypoxia: Secondary | ICD-10-CM | POA: Diagnosis not present

## 2023-01-07 DIAGNOSIS — J984 Other disorders of lung: Secondary | ICD-10-CM | POA: Diagnosis not present

## 2023-01-07 DIAGNOSIS — K581 Irritable bowel syndrome with constipation: Secondary | ICD-10-CM | POA: Diagnosis present

## 2023-01-07 DIAGNOSIS — I447 Left bundle-branch block, unspecified: Secondary | ICD-10-CM | POA: Diagnosis not present

## 2023-01-07 DIAGNOSIS — R059 Cough, unspecified: Secondary | ICD-10-CM | POA: Diagnosis not present

## 2023-01-07 DIAGNOSIS — J9600 Acute respiratory failure, unspecified whether with hypoxia or hypercapnia: Secondary | ICD-10-CM | POA: Diagnosis not present

## 2023-01-07 DIAGNOSIS — M6281 Muscle weakness (generalized): Secondary | ICD-10-CM | POA: Diagnosis not present

## 2023-01-07 DIAGNOSIS — I771 Stricture of artery: Secondary | ICD-10-CM | POA: Diagnosis not present

## 2023-01-07 DIAGNOSIS — R1312 Dysphagia, oropharyngeal phase: Secondary | ICD-10-CM | POA: Diagnosis not present

## 2023-01-07 LAB — CBC WITH DIFFERENTIAL/PLATELET
Abs Immature Granulocytes: 0.1 10*3/uL — ABNORMAL HIGH (ref 0.00–0.07)
Basophils Absolute: 0.1 10*3/uL (ref 0.0–0.1)
Basophils Relative: 0 %
Eosinophils Absolute: 0 10*3/uL (ref 0.0–0.5)
Eosinophils Relative: 0 %
HCT: 37.7 % (ref 36.0–46.0)
Hemoglobin: 12.7 g/dL (ref 12.0–15.0)
Immature Granulocytes: 1 %
Lymphocytes Relative: 10 %
Lymphs Abs: 1.2 10*3/uL (ref 0.7–4.0)
MCH: 29.2 pg (ref 26.0–34.0)
MCHC: 33.7 g/dL (ref 30.0–36.0)
MCV: 86.7 fL (ref 80.0–100.0)
Monocytes Absolute: 1.3 10*3/uL — ABNORMAL HIGH (ref 0.1–1.0)
Monocytes Relative: 11 %
Neutro Abs: 9.3 10*3/uL — ABNORMAL HIGH (ref 1.7–7.7)
Neutrophils Relative %: 78 %
Platelets: 342 10*3/uL (ref 150–400)
RBC: 4.35 MIL/uL (ref 3.87–5.11)
RDW: 12.7 % (ref 11.5–15.5)
WBC: 11.9 10*3/uL — ABNORMAL HIGH (ref 4.0–10.5)
nRBC: 0 % (ref 0.0–0.2)

## 2023-01-07 LAB — COMPREHENSIVE METABOLIC PANEL
ALT: 53 U/L — ABNORMAL HIGH (ref 0–44)
AST: 58 U/L — ABNORMAL HIGH (ref 15–41)
Albumin: 2.8 g/dL — ABNORMAL LOW (ref 3.5–5.0)
Alkaline Phosphatase: 106 U/L (ref 38–126)
Anion gap: 13 (ref 5–15)
BUN: 57 mg/dL — ABNORMAL HIGH (ref 8–23)
CO2: 21 mmol/L — ABNORMAL LOW (ref 22–32)
Calcium: 8.5 mg/dL — ABNORMAL LOW (ref 8.9–10.3)
Chloride: 98 mmol/L (ref 98–111)
Creatinine, Ser: 1.58 mg/dL — ABNORMAL HIGH (ref 0.44–1.00)
GFR, Estimated: 31 mL/min — ABNORMAL LOW (ref >60)
Glucose, Bld: 126 mg/dL — ABNORMAL HIGH (ref 70–99)
Potassium: 2.8 mmol/L — ABNORMAL LOW (ref 3.5–5.1)
Sodium: 132 mmol/L — ABNORMAL LOW (ref 135–145)
Total Bilirubin: 0.8 mg/dL (ref 0.0–1.2)
Total Protein: 6.6 g/dL (ref 6.5–8.1)

## 2023-01-07 LAB — RESP PANEL BY RT-PCR (RSV, FLU A&B, COVID)  RVPGX2
Influenza A by PCR: NEGATIVE
Influenza B by PCR: NEGATIVE
Resp Syncytial Virus by PCR: NEGATIVE
SARS Coronavirus 2 by RT PCR: NEGATIVE

## 2023-01-07 LAB — BRAIN NATRIURETIC PEPTIDE: B Natriuretic Peptide: 172.9 pg/mL — ABNORMAL HIGH (ref 0.0–100.0)

## 2023-01-07 MED ORDER — SODIUM CHLORIDE 0.9 % IV SOLN
1.0000 g | Freq: Once | INTRAVENOUS | Status: AC
  Administered 2023-01-07: 1 g via INTRAVENOUS
  Filled 2023-01-07: qty 10

## 2023-01-07 MED ORDER — SODIUM CHLORIDE 0.9 % IV SOLN
500.0000 mg | Freq: Once | INTRAVENOUS | Status: AC
  Administered 2023-01-07: 500 mg via INTRAVENOUS
  Filled 2023-01-07: qty 5

## 2023-01-07 NOTE — H&P (Signed)
 History and Physical    MONET NORTH FMW:[id number] DOB: 02/22/1933 DOA: 01/07/2023  PCP: Perri Ronal PARAS, MD  Patient coming from: Home  Chief Complaint: SOB  HPI: Sabrina English is a 89 y.o. female with medical history significant of hypertension, hyperlipidemia, CKD stage IIIb, macular degeneration, Mnire's disease, osteoporosis, hyperthyroidism, melanoma, GERD, IBS-C, anxiety presented to ED via EMS for evaluation of shortness of breath and cough.  Oxygen saturation 87% on room air in triage, improved to 90s with 2 L [place].  Afebrile.  Labs notable for WBC count 11.9, sodium 132, potassium 2.8, bicarb 21, glucose 126, BUN 57, creatinine 1.6 (baseline 1.3-1.5), AST 58, ALT 53, alk phos and T. bili normal, BNP 172, COVID/influenza/RSV PCR pending.  EKG showing left bundle branch block which is new compared to previous EKG in the chart from >8 years ago.  Chest x-ray showing bibasilar infiltrates (R>L) consistent with multifocal pneumonia. Patient was given ceftriaxone  and azithromycin .  [facility] called to admit.  History provided by the patient and her daughter at bedside.  Daughter states patient traveled to the beach with her family recently.  For the past 1 week she is having cough, shortness of breath, chills, generalized weakness, and very poor p.o. intake.  No chest pain.  Patient has felt nauseous and had 1 episode of vomiting a few days ago.  No abdominal pain or diarrhea.  No other complaints.  Review of Systems:  Review of Systems  All other systems reviewed and are negative.   Past Medical History:  Diagnosis Date   Cancer (HCC)    melanoma l shoulder   GERD (gastroesophageal reflux disease)    Hyperlipidemia    Macular degeneration    Melanoma (HCC)    Meniere's disease    Osteoporosis    osteopenia   Thyroid  disease     Past Surgical History:  Procedure Laterality Date   AXILLARY NODE DISSECTION  1983   BUNIONECTOMY     CATARACT Bilateral    MELANOMA EXCISION      left shoulder     reports that she has never smoked. She has never used smokeless tobacco. She reports that she does not drink alcohol and does not use drugs.  Allergies  Allergen Reactions   Levaquin  [Levofloxacin  In D5w] Other (See Comments)    Lower extremity pain     Family History  Problem Relation Age of Onset   Mental illness Mother    Hyperlipidemia Mother    Cancer Father    Asthma Father    Cancer Sister    Hypothyroidism Brother    Allergic rhinitis Neg Hx    Angioedema Neg Hx    Atopy Neg Hx    Eczema Neg Hx    Immunodeficiency Neg Hx    Urticaria Neg Hx     Prior to Admission medications   Medication Sig Start Date End Date Taking? Authorizing Provider  ALPRAZolam  (XANAX ) 0.25 MG tablet Take 1 tablet (0.25 mg total) by mouth at bedtime as needed. for sleep 06/25/20   Perri Ronal PARAS, MD  calcium -vitamin D  (OSCAL WITH D) 500-200 MG-UNIT per tablet Take 1 tablet by mouth daily.    [provider]  cholecalciferol  (VITAMIN D3) 25 MCG (1000 UNIT) tablet Take 1,000 Units by mouth daily.    [provider]  cyclobenzaprine  (FLEXERIL ) 10 MG tablet Take 1 tablet (10 mg total) by mouth at bedtime. 06/28/21   Perri Ronal PARAS, MD  dicyclomine  (BENTYL ) 20 MG tablet TAKE 1  TABLET BY MOUTH EVERY 6 HOURS. 09/09/22   Beather Delon Gibson, PA  dicyclomine  (BENTYL ) 20 MG tablet Take 1 tablet (20 mg total) by mouth daily. 11/24/22   Beather Delon Gibson, PA  famotidine  (PEPCID ) 20 MG tablet Take 1 tablet (20 mg total) by mouth 2 (two) times daily. 11/24/22   Beather Delon Gibson, PA  famotidine  (PEPCID ) 40 MG tablet Take 1 tablet (40 mg total) by mouth 2 (two) times daily. 06/02/21   Beather Delon Gibson, PA  ferrous sulfate  325 (65 FE) MG EC tablet Take 325 mg by mouth daily with breakfast.    [provider]  ipratropium (ATROVENT ) 0.06 % nasal spray USE 2 SPRAYS IN BOTH NOSTRILS  EVERY 6 HOURS IF NEEDED TO DRY  UP NOSE 08/25/22   Perri Ronal PARAS, MD   latanoprost  (XALATAN ) 0.005 % ophthalmic solution Place 1 drop into both eyes nightly. 01/15/21   [provider]  meclizine  (ANTIVERT ) 25 MG tablet Take 1 tablet (25 mg total) by mouth 3 (three) times daily as needed. 07/01/22   Perri Ronal PARAS, MD  methimazole  (TAPAZOLE ) 5 MG tablet Take 1 tablet (5 mg total) by mouth as directed. ONE TABLET EVERY OTHER DAY 08/05/22   Shamleffer, Ibtehal Jaralla, MD  simvastatin  (ZOCOR ) 20 MG tablet TAKE 1 TABLET BY MOUTH DAILY AT 6 PM. 12/06/22   Baxley, Ronal PARAS, MD  triamterene -hydrochlorothiazide (DYAZIDE) 37.5-25 MG capsule TAKE 1 CAPSULE BY MOUTH EVERY DAY 10/17/22   Perri Ronal PARAS, MD  Vitamins-Lipotropics (LIPOFLAVONOID PO) Take 1 tablet by mouth 2 (two) times daily.    [provider]    Physical Exam: Vitals:   01/07/23 1916 01/07/23 1922 01/07/23 1924 01/07/23 2215  BP:  (!) 108/59  100/69  Pulse:  99  88  Resp:  16  (!) 26  Temp:  99 F (37.2 C)    TempSrc:  Oral    SpO2: 92% 90%  92%  Weight:   72.6 kg   Height:   5' 1.5 (1.562 m)     Physical Exam Vitals reviewed.  Constitutional:      General: She is not in acute distress. HENT:     Head: Normocephalic and atraumatic.     Mouth/Throat:     Mouth: Mucous membranes are dry.  Eyes:     Extraocular Movements: Extraocular movements intact.  Cardiovascular:     Rate and Rhythm: Normal rate and regular rhythm.     Pulses: Normal pulses.  Pulmonary:     Effort: Pulmonary effort is normal. No respiratory distress.     Breath sounds: No wheezing.     Comments: Mild bibasilar crackles Abdominal:     General: Bowel sounds are normal. There is no distension.     Palpations: Abdomen is soft.     Tenderness: There is no abdominal tenderness. There is no guarding.  Musculoskeletal:     Cervical back: Normal range of motion.     Right lower leg: No edema.     Left lower leg: No edema.  Skin:    General: Skin is warm and dry.  Neurological:     General: No focal deficit  present.     Mental Status: She is alert and oriented to person, place, and time.     Labs on Admission: I have personally reviewed following labs and imaging studies  CBC: Recent Labs  Lab 01/07/23 2105  WBC 11.9*  NEUTROABS 9.3*  HGB 12.7  HCT 37.7  MCV 86.7  PLT 342   Basic Metabolic Panel: Recent Labs  Lab 01/07/23 2105  NA 132*  K 2.8*  CL 98  CO2 21*  GLUCOSE 126*  BUN 57*  CREATININE 1.58*  CALCIUM  8.5*   GFR: Estimated Creatinine Clearance: 23.1 mL/min (A) (by C-G formula based on SCr of 1.58 mg/dL (H)). Liver Function Tests: Recent Labs  Lab 01/07/23 2105  AST 58*  ALT 53*  ALKPHOS 106  BILITOT 0.8  PROT 6.6  ALBUMIN 2.8*   No results for input(s): LIPASE, AMYLASE in the last 168 hours. No results for input(s): AMMONIA in the last 168 hours. Coagulation Profile: No results for input(s): INR, PROTIME in the last 168 hours. Cardiac Enzymes: No results for input(s): CKTOTAL, CKMB, CKMBINDEX, TROPONINI in the last 168 hours. BNP (last 3 results) No results for input(s): PROBNP in the last 8760 hours. HbA1C: No results for input(s): HGBA1C in the last 72 hours. CBG: No results for input(s): GLUCAP in the last 168 hours. Lipid Profile: No results for input(s): CHOL, HDL, LDLCALC, TRIG, CHOLHDL, LDLDIRECT in the last 72 hours. Thyroid  Function Tests: No results for input(s): TSH, T4TOTAL, FREET4, T3FREE, THYROIDAB in the last 72 hours. Anemia Panel: No results for input(s): VITAMINB12, FOLATE, FERRITIN, TIBC, IRON, RETICCTPCT in the last 72 hours. Urine analysis:    Component Value Date/Time   BILIRUBINUR neg 07/01/2022 1552   PROTEINUR Negative 07/01/2022 1552   UROBILINOGEN 0.2 07/01/2022 1552   NITRITE neg 07/01/2022 1552   LEUKOCYTESUR Negative 07/01/2022 1552    Radiological Exams on Admission: DG Chest 2 View Result Date: 01/07/2023 CLINICAL DATA:  Shortness of breath for several  days, initial encounter EXAM: CHEST - 2 VIEW COMPARISON:  06/08/2015 FINDINGS: Cardiac shadow is within normal limits. Tortuous thoracic aorta is noted with calcification. Lungs are well aerated bilaterally. Patchy bibasilar airspace opacity is noted right greater than left consistent with multifocal pneumonia. Postsurgical changes in the left axilla are seen. IMPRESSION: Bibasilar infiltrates right greater than left. Electronically Signed   By: Oneil Devonshire M.D.   On: 01/07/2023 19:48    EKG: Independently reviewed. Sinus tachycardia, LBBB new compared to previous EKG from September 2016. No recent EKG for comparison.   Assessment and Plan  Acute hypoxemic respiratory failure secondary to multifocal pneumonia Oxygen saturation 87% on room air, currently satting well on 2 L [place].  Temperature 100 F, WBC count 11.9.  No tachycardia or hypotension to suggest sepsis. Chest x-ray showing bibasilar infiltrates (R>L) consistent with multifocal pneumonia.  COVID/influenza/RSV PCR negative.  Respiratory viral panel ordered.  Continue antibiotics and check procalcitonin level.  MRSA PCR screen, strep pneumo and Legionella urinary antigens, and sputum Gram stain and culture.  Continue supplemental oxygen, wean as tolerated.  Hypokalemia Likely due to poor p.o. intake.  Monitor potassium and magnesium levels, continue to replace as needed.  Mild hypokalemia In the setting of poor p.o. intake.  Gentle IV fluid hydration and monitor labs.  Mild elevation of transaminases No abdominal pain or tenderness.  Alk phos and T. bili normal.  Continue to monitor LFTs.  Left bundle branch block Seen on EKG and appears new compared to previous EKG in the chart from over 8 years ago.  ACS less likely as patient is not endorsing chest pain.  Check troponin.  CKD stage IIIb Creatinine close to baseline, continue to monitor labs.  Hypertension Hyperlipidemia Hyperthyroidism GERD IBS-C Anxiety Pharmacy med rec  pending.  DVT prophylaxis: Lovenox  (dose adjusted based on renal function) Code Status:  DNR/DNI (discussed with the patient and her daughter). Family Communication: Patient's daughter and son-in-law at bedside. Level of care: Telemetry bed Admission status: It is my clinical opinion that admission to INPATIENT is reasonable and necessary because of the expectation that this patient will require hospital care that crosses at least 2 midnights to treat this condition based on the medical complexity of the problems presented.  Given the aforementioned information, the predictability of an adverse outcome is felt to be significant.  Editha Ram MD [facility]  If 7PM-7AM, please contact night-coverage [email or web address]  01/07/2023, 10:58 PM

## 2023-01-07 NOTE — ED Triage Notes (Signed)
 Patient BIB EMS from home for evaluation of difficulty breathing x 3-4 days.  Has had cough.  No reports of fever.  Pt alert and oriented x 4.  92% on RA for EMS.

## 2023-01-07 NOTE — ED Provider Triage Note (Signed)
 Emergency Medicine Provider Triage Evaluation Note  Sabrina English , a 89 y.o. female  was evaluated in triage.  Pt complains of sob.  Review of Systems  Positive: Cough, sob, malaise and fatigue Negative: Vomiting, chest pain  Physical Exam  BP (!) 108/59 (BP Location: Right Arm)   Pulse 99   Temp 99 F (37.2 C) (Oral)   Resp 16   Ht 5' 1.5 (1.562 m)   Wt 72.6 kg   SpO2 90%   BMI 29.74 kg/m  Gen:   Awake, Frail Resp:  Normal effort  MSK:   Moves extremities without difficulty  Other:    Medical Decision Making  Medically screening exam initiated at 7:26 PM.  Appropriate orders placed.  Sabrina English was informed that the remainder of the evaluation will be completed by another provider, this initial triage assessment does not replace that evaluation, and the importance of remaining in the ED until their evaluation is complete.  Patient with one week of URI symptoms of cough, SOB that is progressively worsening.   O2 sat in triage 87%   Sabrina Balls, PA-C 01/07/23 1928

## 2023-01-07 NOTE — ED Provider Notes (Signed)
 [place] [facility] DEPARTMENT AT [facility] Provider Note   CSN: [id number] Arrival date & time: 01/07/23  1906     History  Chief Complaint  Patient presents with   Respiratory Distress   Cough    SKYLEE BAIRD is a 89 y.o. female.  HPI Patient presents with family members who assist with history.  She presents with concern of cough, dyspnea.  Patient was with multiple family members of the past few days.  Patient has no history of pulmonary disease, was well prior to the onset of symptoms.  Now, over the past few days with worsening dyspnea, cough, generalized discomfort she presents for evaluation. Reportedly a family member tested negative for COVID, patient has no known exposures.    Home Medications Prior to Admission medications   Medication Sig Start Date End Date Taking? Authorizing Provider  ALPRAZolam  (XANAX ) 0.25 MG tablet Take 1 tablet (0.25 mg total) by mouth at bedtime as needed. for sleep 06/25/20   Perri Ronal PARAS, MD  calcium -vitamin D  (OSCAL WITH D) 500-200 MG-UNIT per tablet Take 1 tablet by mouth daily.    [provider]  cholecalciferol  (VITAMIN D3) 25 MCG (1000 UNIT) tablet Take 1,000 Units by mouth daily.    [provider]  cyclobenzaprine  (FLEXERIL ) 10 MG tablet Take 1 tablet (10 mg total) by mouth at bedtime. 06/28/21   Perri Ronal PARAS, MD  dicyclomine  (BENTYL ) 20 MG tablet TAKE 1 TABLET BY MOUTH EVERY 6 HOURS. 09/09/22   Beather Delon Gibson, PA  dicyclomine  (BENTYL ) 20 MG tablet Take 1 tablet (20 mg total) by mouth daily. 11/24/22   Beather Delon Gibson, PA  famotidine  (PEPCID ) 20 MG tablet Take 1 tablet (20 mg total) by mouth 2 (two) times daily. 11/24/22   Beather Delon Gibson, PA  famotidine  (PEPCID ) 40 MG tablet Take 1 tablet (40 mg total) by mouth 2 (two) times daily. 06/02/21   Beather Delon Gibson, PA  ferrous sulfate  325 (65 FE) MG EC tablet Take 325 mg by mouth daily with breakfast.    [provider]  ipratropium (ATROVENT ) 0.06 % nasal spray USE 2 SPRAYS IN BOTH NOSTRILS  EVERY 6 HOURS IF NEEDED TO DRY  UP NOSE 08/25/22   Perri Ronal PARAS, MD  latanoprost  (XALATAN ) 0.005 % ophthalmic solution Place 1 drop into both eyes nightly. 01/15/21   [provider]  meclizine  (ANTIVERT ) 25 MG tablet Take 1 tablet (25 mg total) by mouth 3 (three) times daily as needed. 07/01/22   Perri Ronal PARAS, MD  methimazole  (TAPAZOLE ) 5 MG tablet Take 1 tablet (5 mg total) by mouth as directed. ONE TABLET EVERY OTHER DAY 08/05/22   Shamleffer, Ibtehal Jaralla, MD  simvastatin  (ZOCOR ) 20 MG tablet TAKE 1 TABLET BY MOUTH DAILY AT 6 PM. 12/06/22   Baxley, Ronal PARAS, MD  triamterene -hydrochlorothiazide (DYAZIDE) 37.5-25 MG capsule TAKE 1 CAPSULE BY MOUTH EVERY DAY 10/17/22   Perri Ronal PARAS, MD  Vitamins-Lipotropics (LIPOFLAVONOID PO) Take 1 tablet by mouth 2 (two) times daily.    [provider]      Allergies    Levaquin  [levofloxacin  in d5w]    Review of Systems   Review of Systems  Physical Exam Updated Vital Signs BP (!) 108/59 (BP Location: Right Arm)   Pulse 99   Temp 99 F (37.2 C) (Oral)   Resp 16   Ht 5' 1.5 (1.562 m)   Wt 72.6 kg   SpO2 90%   BMI 29.74 kg/m  Physical Exam Vitals and nursing note reviewed.  Constitutional:      General: She is not in acute distress.    Appearance: She is well-developed.  HENT:     Head: Normocephalic and atraumatic.  Eyes:     Conjunctiva/sclera: Conjunctivae normal.  Cardiovascular:     Rate and Rhythm: Normal rate and regular rhythm.  Pulmonary:     Effort: Pulmonary effort is normal. No respiratory distress.     Breath sounds: Normal breath sounds. No stridor.  Abdominal:     General: There is no distension.  Skin:    General: Skin is warm and dry.  Neurological:     Mental Status: She is alert and oriented to person, place, and time.     Cranial Nerves: No cranial nerve deficit.  Psychiatric:        Mood and Affect: Mood normal.      ED Results / Procedures / Treatments   Labs (all labs ordered are listed, but only abnormal results are displayed) Labs Reviewed  CBC WITH DIFFERENTIAL/PLATELET - Abnormal; Notable for the following components:      Result Value   WBC 11.9 (*)    Neutro Abs 9.3 (*)    Monocytes Absolute 1.3 (*)    Abs Immature Granulocytes 0.10 (*)    All other components within normal limits  COMPREHENSIVE METABOLIC PANEL - Abnormal; Notable for the following components:   Sodium 132 (*)    Potassium 2.8 (*)    CO2 21 (*)    Glucose, Bld 126 (*)    BUN 57 (*)    Creatinine, Ser 1.58 (*)    Calcium  8.5 (*)    Albumin 2.8 (*)    AST 58 (*)    ALT 53 (*)    GFR, Estimated 31 (*)    All other components within normal limits  BRAIN NATRIURETIC PEPTIDE - Abnormal; Notable for the following components:   B Natriuretic Peptide 172.9 (*)    All other components within normal limits  RESP PANEL BY RT-PCR (RSV, FLU A&B, COVID)  RVPGX2  URINALYSIS, ROUTINE W REFLEX MICROSCOPIC    EKG None  Radiology DG Chest 2 View Result Date: 01/07/2023 CLINICAL DATA:  Shortness of breath for several days, initial encounter EXAM: CHEST - 2 VIEW COMPARISON:  06/08/2015 FINDINGS: Cardiac shadow is within normal limits. Tortuous thoracic aorta is noted with calcification. Lungs are well aerated bilaterally. Patchy bibasilar airspace opacity is noted right greater than left consistent with multifocal pneumonia. Postsurgical changes in the left axilla are seen. IMPRESSION: Bibasilar infiltrates right greater than left. Electronically Signed   By: Oneil Devonshire M.D.   On: 01/07/2023 19:48    Procedures Procedures    Medications Ordered in ED Medications  cefTRIAXone  (ROCEPHIN ) 1 g in sodium chloride  0.9 % 100 mL IVPB (1 g Intravenous New Bag/Given 01/07/23 2145)  azithromycin  (ZITHROMAX ) 500 mg in sodium chloride  0.9 % 250 mL IVPB (has no administration in time range)    ED Course/ Medical Decision Making/ A&P                                  Medical Decision Making Elderly female presents with cough, shortness of breath.  Patient has new oxygen requirement, 2 L to achieve saturation 94% this is abnormal, suggestive of pneumonia versus COVID.  Patient has no chest pain, which is somewhat reassuring, low suspicion for ACS.  EKG with  left bundle branch block, otherwise noncontributory.  With new oxygen requirement, x-ray consistent with bilateral pneumonia patient will require admission.  Initial labs consistent with this, with mild leukocytosis.  COVID pending on admission.  Amount and/or Complexity of Data Reviewed Independent Historian: caregiver Labs: ordered. Decision-making details documented in ED Course. Radiology: ordered and independent interpretation performed. Decision-making details documented in ED Course.  Risk Prescription drug management. Decision regarding hospitalization. Diagnosis or treatment significantly limited by social determinants of health.  Final Clinical Impression(s) / ED Diagnoses Final diagnoses:  Pneumonia of both lower lobes due to infectious organism     Garrick Charleston, MD 01/07/23 2154

## 2023-01-08 DIAGNOSIS — I447 Left bundle-branch block, unspecified: Secondary | ICD-10-CM

## 2023-01-08 DIAGNOSIS — J189 Pneumonia, unspecified organism: Secondary | ICD-10-CM | POA: Diagnosis not present

## 2023-01-08 DIAGNOSIS — J9601 Acute respiratory failure with hypoxia: Secondary | ICD-10-CM

## 2023-01-08 DIAGNOSIS — E871 Hypo-osmolality and hyponatremia: Secondary | ICD-10-CM

## 2023-01-08 DIAGNOSIS — E876 Hypokalemia: Secondary | ICD-10-CM

## 2023-01-08 LAB — RESPIRATORY PANEL BY PCR

## 2023-01-08 LAB — EXPECTORATED SPUTUM ASSESSMENT W GRAM STAIN, RFLX TO RESP C

## 2023-01-08 LAB — COMPREHENSIVE METABOLIC PANEL
ALT: 72 U/L — ABNORMAL HIGH (ref 0–44)
AST: 92 U/L — ABNORMAL HIGH (ref 15–41)
Albumin: 2.4 g/dL — ABNORMAL LOW (ref 3.5–5.0)
Alkaline Phosphatase: 101 U/L (ref 38–126)
Anion gap: 10 (ref 5–15)
BUN: 50 mg/dL — ABNORMAL HIGH (ref 8–23)
CO2: 22 mmol/L (ref 22–32)
Calcium: 8.2 mg/dL — ABNORMAL LOW (ref 8.9–10.3)
Chloride: 101 mmol/L (ref 98–111)
Creatinine, Ser: 1.41 mg/dL — ABNORMAL HIGH (ref 0.44–1.00)
GFR, Estimated: 36 mL/min — ABNORMAL LOW (ref >60)
Glucose, Bld: 114 mg/dL — ABNORMAL HIGH (ref 70–99)
Potassium: 3.4 mmol/L — ABNORMAL LOW (ref 3.5–5.1)
Sodium: 133 mmol/L — ABNORMAL LOW (ref 135–145)
Total Bilirubin: 0.8 mg/dL (ref 0.0–1.2)
Total Protein: 5.9 g/dL — ABNORMAL LOW (ref 6.5–8.1)

## 2023-01-08 LAB — CBC
HCT: 34.7 % — ABNORMAL LOW (ref 36.0–46.0)
Hemoglobin: 11.6 g/dL — ABNORMAL LOW (ref 12.0–15.0)
MCH: 29.4 pg (ref 26.0–34.0)
MCHC: 33.4 g/dL (ref 30.0–36.0)
MCV: 88.1 fL (ref 80.0–100.0)
Platelets: 322 10*3/uL (ref 150–400)
RBC: 3.94 MIL/uL (ref 3.87–5.11)
RDW: 12.8 % (ref 11.5–15.5)
WBC: 11.4 10*3/uL — ABNORMAL HIGH (ref 4.0–10.5)
nRBC: 0 % (ref 0.0–0.2)

## 2023-01-08 LAB — MRSA NEXT GEN BY PCR, NASAL: MRSA by PCR Next Gen: NOT DETECTED

## 2023-01-08 LAB — PROCALCITONIN: Procalcitonin: 2.96 ng/mL

## 2023-01-08 LAB — MAGNESIUM: Magnesium: 1.8 mg/dL (ref 1.7–2.4)

## 2023-01-08 LAB — STREP PNEUMONIAE URINARY ANTIGEN: Strep Pneumo Urinary Antigen: NEGATIVE

## 2023-01-08 LAB — TROPONIN I (HIGH SENSITIVITY): Troponin I (High Sensitivity): 25 ng/L — ABNORMAL HIGH (ref <18)

## 2023-01-08 MED ORDER — GUAIFENESIN-DM 100-10 MG/5ML PO SYRP
5.0000 mL | ORAL_SOLUTION | Freq: Four times a day (QID) | ORAL | Status: DC | PRN
  Administered 2023-01-08 – 2023-01-10 (×7): 5 mL via ORAL
  Filled 2023-01-08 (×7): qty 5

## 2023-01-08 MED ORDER — SIMVASTATIN 20 MG PO TABS
20.0000 mg | ORAL_TABLET | Freq: Every day | ORAL | Status: DC
  Administered 2023-01-08 – 2023-01-09 (×2): 20 mg via ORAL
  Filled 2023-01-08 (×2): qty 1

## 2023-01-08 MED ORDER — FERROUS SULFATE 325 (65 FE) MG PO TABS
325.0000 mg | ORAL_TABLET | Freq: Every day | ORAL | Status: DC
  Administered 2023-01-09 – 2023-01-13 (×5): 325 mg via ORAL
  Filled 2023-01-08 (×5): qty 1

## 2023-01-08 MED ORDER — LORATADINE 10 MG PO TABS
10.0000 mg | ORAL_TABLET | Freq: Every day | ORAL | Status: DC | PRN
  Administered 2023-01-08: 10 mg via ORAL
  Filled 2023-01-08 (×2): qty 1

## 2023-01-08 MED ORDER — POTASSIUM CHLORIDE CRYS ER 20 MEQ PO TBCR
40.0000 meq | EXTENDED_RELEASE_TABLET | ORAL | Status: AC
Start: 2023-01-08 — End: 2023-01-08
  Administered 2023-01-08 (×2): 40 meq via ORAL
  Filled 2023-01-08 (×2): qty 2

## 2023-01-08 MED ORDER — METHIMAZOLE 5 MG PO TABS
5.0000 mg | ORAL_TABLET | ORAL | Status: DC
  Administered 2023-01-08 – 2023-01-12 (×3): 5 mg via ORAL
  Filled 2023-01-08 (×3): qty 1

## 2023-01-08 MED ORDER — CYCLOBENZAPRINE HCL 10 MG PO TABS: 10.0000 mg | ORAL_TABLET | Freq: Every day | ORAL | Status: DC | PRN

## 2023-01-08 MED ORDER — BENZONATATE 100 MG PO CAPS
100.0000 mg | ORAL_CAPSULE | Freq: Three times a day (TID) | ORAL | Status: DC | PRN
  Administered 2023-01-08 – 2023-01-10 (×6): 100 mg via ORAL
  Filled 2023-01-08 (×6): qty 1

## 2023-01-08 MED ORDER — VITAMIN D 25 MCG (1000 UNIT) PO TABS
1000.0000 [IU] | ORAL_TABLET | Freq: Every day | ORAL | Status: DC
  Administered 2023-01-08 – 2023-01-13 (×6): 1000 [IU] via ORAL
  Filled 2023-01-08 (×6): qty 1

## 2023-01-08 MED ORDER — IPRATROPIUM BROMIDE 0.06 % NA SOLN
1.0000 | Freq: Four times a day (QID) | NASAL | Status: DC
  Administered 2023-01-08 – 2023-01-12 (×17): 1 via NASAL
  Filled 2023-01-08: qty 15

## 2023-01-08 MED ORDER — DICYCLOMINE HCL 20 MG PO TABS
20.0000 mg | ORAL_TABLET | Freq: Every day | ORAL | Status: DC
  Administered 2023-01-08 – 2023-01-13 (×6): 20 mg via ORAL
  Filled 2023-01-08 (×6): qty 1

## 2023-01-08 MED ORDER — ALPRAZOLAM 0.25 MG PO TABS
0.2500 mg | ORAL_TABLET | Freq: Every evening | ORAL | Status: DC | PRN
  Administered 2023-01-10 – 2023-01-12 (×3): 0.25 mg via ORAL
  Filled 2023-01-08 (×3): qty 1

## 2023-01-08 MED ORDER — HOME MED STORE IN PYXIS
1.0000 | Freq: Every day | Status: DC
  Administered 2023-01-08 – 2023-01-12 (×5): 1 via ORAL
  Filled 2023-01-08 (×5): qty 1

## 2023-01-08 MED ORDER — MECLIZINE HCL 25 MG PO TABS: 25.0000 mg | ORAL_TABLET | Freq: Three times a day (TID) | ORAL | Status: DC | PRN

## 2023-01-08 MED ORDER — ENOXAPARIN SODIUM 30 MG/0.3ML IJ SOSY
30.0000 mg | PREFILLED_SYRINGE | INTRAMUSCULAR | Status: DC
  Administered 2023-01-08 – 2023-01-10 (×3): 30 mg via SUBCUTANEOUS
  Filled 2023-01-08 (×3): qty 0.3

## 2023-01-08 MED ORDER — LATANOPROST 0.005 % OP SOLN
1.0000 [drp] | Freq: Every day | OPHTHALMIC | Status: DC
  Administered 2023-01-08 – 2023-01-12 (×5): 1 [drp] via OPHTHALMIC
  Filled 2023-01-08: qty 2.5

## 2023-01-08 MED ORDER — GUAIFENESIN-DM 100-10 MG/5ML PO SYRP: 5.0000 mL | ORAL_SOLUTION | ORAL | Status: DC | PRN

## 2023-01-08 MED ORDER — SODIUM CHLORIDE 0.9 % IV SOLN: INTRAVENOUS | Status: AC

## 2023-01-08 MED ORDER — OYSTER SHELL CALCIUM/D3 500-5 MG-MCG PO TABS
1.0000 | ORAL_TABLET | Freq: Every day | ORAL | Status: DC
  Administered 2023-01-08 – 2023-01-13 (×6): 1 via ORAL
  Filled 2023-01-08 (×6): qty 1

## 2023-01-08 MED ORDER — ACETAMINOPHEN 325 MG PO TABS: 650.0000 mg | ORAL_TABLET | Freq: Four times a day (QID) | ORAL | Status: DC | PRN

## 2023-01-08 MED ORDER — SODIUM CHLORIDE 0.9 % IV SOLN
500.0000 mg | INTRAVENOUS | Status: DC
  Administered 2023-01-08 – 2023-01-10 (×3): 500 mg via INTRAVENOUS
  Filled 2023-01-08 (×3): qty 5

## 2023-01-08 MED ORDER — FAMOTIDINE 20 MG PO TABS
20.0000 mg | ORAL_TABLET | Freq: Two times a day (BID) | ORAL | Status: DC
  Administered 2023-01-08 – 2023-01-13 (×11): 20 mg via ORAL
  Filled 2023-01-08 (×11): qty 1

## 2023-01-08 MED ORDER — SODIUM CHLORIDE 0.9 % IV SOLN
1.0000 g | INTRAVENOUS | Status: DC
  Administered 2023-01-08 – 2023-01-10 (×3): 1 g via INTRAVENOUS
  Filled 2023-01-08 (×3): qty 10

## 2023-01-08 MED ORDER — ACETAMINOPHEN 650 MG RE SUPP
650.0000 mg | Freq: Four times a day (QID) | RECTAL | Status: DC | PRN
Start: 2023-01-08 — End: 2023-01-13

## 2023-01-08 MED ORDER — ONDANSETRON HCL 4 MG/2ML IJ SOLN
4.0000 mg | Freq: Four times a day (QID) | INTRAMUSCULAR | Status: DC | PRN
  Administered 2023-01-08 – 2023-01-12 (×2): 4 mg via INTRAVENOUS
  Filled 2023-01-08 (×2): qty 2

## 2023-01-08 NOTE — Progress Notes (Signed)
   01/08/23 0533  Assess: MEWS Score  Temp 98 F (36.7 C)  BP 106/75  MAP (mmHg) 83  Pulse Rate 84  Resp (!) 36  SpO2 95 %  O2 Device Nasal Cannula  Assess: MEWS Score  MEWS Temp 0  MEWS Systolic 0  MEWS Pulse 0  MEWS RR 3  MEWS LOC 0  MEWS Score 3  MEWS Score Color Yellow  Assess: if the MEWS score is Yellow or Red  Were vital signs accurate and taken at a resting state? Yes  Does the patient meet 2 or more of the SIRS criteria? No  MEWS guidelines implemented  Yes, yellow  Treat  MEWS Interventions Considered administering scheduled or prn medications/treatments as ordered  Take Vital Signs  Increase Vital Sign Frequency  Yellow: Q2hr x1, continue Q4hrs until patient remains green for 12hrs  Escalate  MEWS: Escalate Yellow: Discuss with charge nurse and consider notifying provider and/or RRT  Notify: Charge Nurse/RN  Name of Charge Nurse/RN Notified Renita RN  Assess: SIRS CRITERIA  SIRS Temperature  0  SIRS Respirations  1  SIRS Pulse 0  SIRS WBC 0  SIRS Score Sum  1

## 2023-01-08 NOTE — Evaluation (Signed)
 Physical Therapy Evaluation Patient Details Name: Sabrina English MRN: [id number] DOB: January 27, 1933 Today's Date: 01/08/2023  History of Present Illness  89 y.o. female presented to hospital with shortness of breath and cough with pulse ox of 87%, admitted with Acute hypoxemic respiratory failure secondary to multifocal pneumonia PMH: hypertension, hyperlipidemia, CKD stage IIIb, macular degeneration, Mnire's disease, osteoporosis, hyperthyroidism, melanoma, GERD, IBS-C, anxiety  Clinical Impression  Pt admitted with above diagnosis.  Pt independent at baseline, no longer drives d/t low vision. Pt presents today with significant deconditioning and global weakness. Amb 30' with RW and min assist, limited by dyspnea/fatigue, requiring 2L O2 to maintain sats >90%.  Instructed in proper breathing techniques, pt tends to mouth breathe per family Pt dtr present for session and assists with iADLS at baseline;  Pt will benefit from HHPT at d/c   Pt currently with functional limitations due to the deficits listed below (see PT Problem List). Pt will benefit from acute skilled PT to increase their independence and safety with mobility to allow discharge.           If plan is discharge home, recommend the following: A little help with walking and/or transfers;A little help with bathing/dressing/bathroom;Assistance with cooking/housework;Assist for transportation;Help with stairs or ramp for entrance   Can travel by private vehicle        Equipment Recommendations None recommended by PT  Recommendations for Other Services       Functional Status Assessment Patient has had a recent decline in their functional status and demonstrates the ability to make significant improvements in function in a reasonable and predictable amount of time.     Precautions / Restrictions Precautions Precautions: Fall Precaution Comments: monitor O2 Restrictions Weight Bearing Restrictions Per Provider Order: No       Mobility  Bed Mobility Overal bed mobility: Needs Assistance Bed Mobility: Supine to Sit     Supine to sit: Contact guard     General bed mobility comments: to elevate trunk,incr time    Transfers Overall transfer level: Needs assistance Equipment used: Rolling walker (2 wheels) Transfers: Sit to/from Stand Sit to Stand: Contact guard assist           General transfer comment: cues for hand placement    Ambulation/Gait Ambulation/Gait assistance: Contact guard assist Gait Distance (Feet): 30 Feet Assistive device: Rolling walker (2 wheels) Gait Pattern/deviations: Step-through pattern, Trunk flexed       General Gait Details: cues for posture, trunk extension and proper breathing; On 2L [place] O2, SpO2=90-96%, 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, [employer] (Stroke Patients Only)       Balance Overall balance assessment: Needs assistance Sitting-balance support: Feet supported, No upper extremity supported Sitting balance-Leahy Scale: Fair     Standing balance support: Reliant on assistive device for balance, During functional activity Standing balance-Leahy Scale: Poor                               Pertinent Vitals/Pain Pain Assessment Pain Assessment: No/denies pain    Home Living Family/patient expects to be discharged to:: Private residence Living Arrangements: Alone Available Help at Discharge: Family;Available 24 hours/day Type of Home: House Home Access: Level entry       Home Layout: One level Home Equipment: Rollator (4 wheels)      Prior Function Prior Level  of Function : Independent/Modified Independent             Mobility Comments: dtr assists with errands, MD appts, takes her to get groceries; amb without device prior to admission       Extremity/Trunk Assessment   Upper Extremity Assessment Upper Extremity Assessment: Defer to OT evaluation;Generalized  weakness    Lower Extremity Assessment Lower Extremity Assessment: Generalized weakness       Communication   Communication Communication: No apparent difficulties  Cognition Arousal: Alert Behavior During Therapy: WFL for tasks assessed/performed Overall Cognitive Status: Within Functional Limits for tasks assessed                                          General Comments      Exercises     Assessment/Plan    PT Assessment Patient needs continued PT services  PT Problem List Decreased strength;Decreased activity tolerance;Decreased balance;Decreased mobility;Cardiopulmonary status limiting activity;Decreased knowledge of use of DME       PT Treatment Interventions DME instruction;Gait training;Functional mobility training;Therapeutic activities;Therapeutic exercise;Patient/family education    PT Goals (Current goals can be found in the Care Plan section)  Acute Rehab PT Goals PT Goal Formulation: With patient Time For Goal Achievement: 01/22/23 Potential to Achieve Goals: Good    Frequency Min 1X/week     Co-evaluation               AM-PAC PT 6 Clicks Mobility  Outcome Measure Help needed turning from your back to your side while in a flat bed without using bedrails?: A Little Help needed moving from lying on your back to sitting on the side of a flat bed without using bedrails?: A Little Help needed moving to and from a bed to a chair (including a wheelchair)?: A Little Help needed standing up from a chair using your arms (e.g., wheelchair or bedside chair)?: A Little Help needed to walk in hospital room?: A Little Help needed climbing 3-5 steps with a railing? : A Lot 6 Click Score: 17    End of Session Equipment Utilized During Treatment: Gait belt Activity Tolerance: Patient tolerated treatment well;Patient limited by fatigue Patient left: in chair;with call bell/phone within reach;with family/visitor present (no alarms on  arrival)   PT Visit Diagnosis: Other abnormalities of gait and mobility (R26.89);Difficulty in walking, not elsewhere classified (R26.2)    Time: 8389-8370 PT Time Calculation (min) (ACUTE ONLY): 19 min   Charges:   PT Evaluation $PT Eval Low Complexity: 1 Low   PT General Charges $$ ACUTE PT VISIT: 1 Visit         Kewana Sanon, PT  Acute Rehab Dept [facility]) [phone]  01/08/2023   [facility] 01/08/2023, 4:36 PM

## 2023-01-08 NOTE — Plan of Care (Signed)

## 2023-01-08 NOTE — Progress Notes (Signed)
 PROGRESS NOTE    [name]  FMW:[id number] DOB: 1933-08-09 DOA: 01/07/2023 PCP: Perri Ronal PARAS, MD    Brief Narrative:   Sabrina English is a 89 y.o. female with medical history significant of hypertension, hyperlipidemia, CKD stage IIIb, macular degeneration, Mnire's disease, osteoporosis, hyperthyroidism, melanoma, GERD, IBS-C, anxiety presented to hospital with shortness of breath and cough with pulse ox of 87% in the triage.  Patient was afebrile.  Initial labs showed WBC elevated at 11.9 with mild hyponatremia at 132  and hypokalemia at 2.8.  creatinine 1.6 (baseline 1.3-1.5), AST 58, ALT 53,  BNP 172, COVID/influenza/RSV PCR negative.  EKG showing left bundle branch block which is new compared to previous EKG in the chart from >8 years ago.  Chest x-ray showing bibasilar infiltrates (R>L) consistent with multifocal pneumonia. Patient was given ceftriaxone  and azithromycin  and was considered for admission to the hospital for further evaluation and treatment..   Assessment and Plan   Acute hypoxemic respiratory failure secondary to multifocal pneumonia Patient with was hypoxic febrile with mild leukocytosis on presentation.  BNP elevated.  Chest x-ray showing bibasilar infiltrates (R>L) consistent with multifocal pneumonia.  COVID/influenza/RSV PCR negative.  Respiratory viral panel pending.  Continue antibiotics with Rocephin  and Zithromax  and check procalcitonin level.  MRSA PCR screen negative.  Pending strep pneumo and Legionella urinary antigens, and sputum Gram stain and culture.  Continue supplemental oxygen, wean as tolerated.  On nasal cannula oxygen.  Will wean oxygen as able.  Temperature max of 100 F.   Hypokalemia Likely secondary to poor oral intake.  Continue replacement.  Check labs from today.   Mild elevation of transaminases No abdominal pain or tenderness.  Alk phos and T. bili normal.  Continue to monitor LFTs.   Left bundle branch block Seen on EKG and  appears new compared to previous EKG in the chart from over 8 years ago.  ACS less likely as patient is not endorsing chest pain.  Check troponin, pending..   CKD stage IIIb Creatinine close to baseline, continue to monitor labs.  Check BMP in AM.   Hypertension Not on medications at home we will continue to monitor.  Hyperlipidemia Will resume simvastatin .  Hyperthyroidism Takes methimazole  every other day.  Will resume  GERD Continue Pepcid   IBS-C Anxiety Resume Xanax  from home    DVT prophylaxis: enoxaparin  (LOVENOX ) injection 30 mg Start: 01/08/23 1000   Code Status:     Code Status: Limited: Do not attempt resuscitation (DNR) -DNR-LIMITED -Do Not Intubate/DNI   Disposition: Uncertain at this time.  Will get PT OT evaluation  Status is: Inpatient  Remains inpatient appropriate because: Multifocal pneumonia, supplemental oxygen, IV antibiotic pending clinical improvement   Family Communication: Spoke with the patient's daughter at bedside.  Consultants:  None's  Procedures:  None  Antimicrobials:  Rocephin  and Zithromax   Anti-infectives (From admission, onward)    Start     Dose/Rate Route Frequency Ordered Stop   01/08/23 2200  azithromycin  (ZITHROMAX ) 500 mg in sodium chloride  0.9 % 250 mL IVPB        500 mg 250 mL/hr over 60 Minutes Intravenous Every 24 hours 01/08/23 0002     01/08/23 2100  cefTRIAXone  (ROCEPHIN ) 1 g in sodium chloride  0.9 % 100 mL IVPB        1 g 200 mL/hr over 30 Minutes Intravenous Every 24 hours 01/08/23 0002     01/07/23 2130  cefTRIAXone  (ROCEPHIN ) 1 g in sodium chloride  0.9 % 100 mL IVPB  1 g 200 mL/hr over 30 Minutes Intravenous  Once 01/07/23 2118 01/07/23 2215   01/07/23 2130  azithromycin  (ZITHROMAX ) 500 mg in sodium chloride  0.9 % 250 mL IVPB        500 mg 250 mL/hr over 60 Minutes Intravenous  Once 01/07/23 2118 01/07/23 2318        Subjective: Today, patient was seen and examined at bedside.  Complains of  cough and shortness of breath while coughing.  Denies any chest pain.  Patient's daughter at bedside.  Denies any nausea vomiting or abdominal pain  Objective: Vitals:   01/07/23 2358 01/08/23 0054 01/08/23 0533 01/08/23 0756  BP:  (!) 115/55 106/75 (!) 103/53  Pulse:  84 84 83  Resp:  20 (!) 36 18  Temp: 99.4 F (37.4 C) 98.9 F (37.2 C) 98 F (36.7 C) 97.9 F (36.6 C)  TempSrc: Oral Oral Oral Oral  SpO2:  93% 95% 96%  Weight:      Height:        Intake/Output Summary (Last 24 hours) at 01/08/2023 1033 Last data filed at 01/08/2023 0300 Gross per 24 hour  Intake 224.79 ml  Output 200 ml  Net 24.79 ml   Filed Weights   01/07/23 1924  Weight: 72.6 kg    Physical Examination: Body mass index is 29.74 kg/m.   General:  Average built, not in obvious distress, on nasal cannula oxygen HENT:   No scleral pallor or icterus noted. Oral mucosa is moist.  Chest: Diminished breath sounds bilateral   coarse breath sounds noted bilaterally. CVS: S1 &S2 heard. No murmur.  Regular rate and rhythm. Abdomen: Soft, nontender, nondistended.  Bowel sounds are heard.   Extremities: No cyanosis, clubbing or edema.  Peripheral pulses are palpable. Psych: Alert, awake and oriented, normal mood CNS:  No cranial nerve deficits.  Power equal in all extremities.  Generalized weakness noted Skin: Warm and dry.  No rashes noted.  Data Reviewed:   CBC: Recent Labs  Lab 01/07/23 2105 01/08/23 0656  WBC 11.9* 11.4*  NEUTROABS 9.3*  --   HGB 12.7 11.6*  HCT 37.7 34.7*  MCV 86.7 88.1  PLT 342 322    Basic Metabolic Panel: Recent Labs  Lab 01/07/23 2105 01/08/23 0656  NA 132* 133*  K 2.8* 3.4*  CL 98 101  CO2 21* 22  GLUCOSE 126* 114*  BUN 57* 50*  CREATININE 1.58* 1.41*  CALCIUM  8.5* 8.2*  MG  --  1.8    Liver Function Tests: Recent Labs  Lab 01/07/23 2105 01/08/23 0656  AST 58* 92*  ALT 53* 72*  ALKPHOS 106 101  BILITOT 0.8 0.8  PROT 6.6 5.9*  ALBUMIN 2.8* 2.4*      Radiology Studies: DG Chest 2 View Result Date: 01/07/2023 CLINICAL DATA:  Shortness of breath for several days, initial encounter EXAM: CHEST - 2 VIEW COMPARISON:  06/08/2015 FINDINGS: Cardiac shadow is within normal limits. Tortuous thoracic aorta is noted with calcification. Lungs are well aerated bilaterally. Patchy bibasilar airspace opacity is noted right greater than left consistent with multifocal pneumonia. Postsurgical changes in the left axilla are seen. IMPRESSION: Bibasilar infiltrates right greater than left. Electronically Signed   By: Oneil Devonshire M.D.   On: 01/07/2023 19:48      LOS: 1 day     Vernal Alstrom, MD [facility] Available via Epic secure chat 7am-7pm After these hours, please refer to coverage provider listed on [email or web address] 01/08/2023, 10:33 AM

## 2023-01-08 NOTE — Plan of Care (Signed)
   Problem: Clinical Measurements: Goal: Will remain free from infection Outcome: Progressing Goal: Diagnostic test results will improve Outcome: Progressing   Problem: Safety: Goal: Ability to remain free from injury will improve Outcome: Progressing

## 2023-01-09 ENCOUNTER — Ambulatory Visit: Payer: Medicare Other | Admitting: Internal Medicine

## 2023-01-09 DIAGNOSIS — E0590 Thyrotoxicosis, unspecified without thyrotoxic crisis or storm: Secondary | ICD-10-CM

## 2023-01-09 DIAGNOSIS — J9601 Acute respiratory failure with hypoxia: Secondary | ICD-10-CM

## 2023-01-09 DIAGNOSIS — J189 Pneumonia, unspecified organism: Secondary | ICD-10-CM | POA: Diagnosis not present

## 2023-01-09 DIAGNOSIS — E7849 Other hyperlipidemia: Secondary | ICD-10-CM | POA: Diagnosis not present

## 2023-01-09 LAB — CBC
HCT: 32.8 % — ABNORMAL LOW (ref 36.0–46.0)
Hemoglobin: 10.9 g/dL — ABNORMAL LOW (ref 12.0–15.0)
MCH: 30.1 pg (ref 26.0–34.0)
MCHC: 33.2 g/dL (ref 30.0–36.0)
MCV: 90.6 fL (ref 80.0–100.0)
Platelets: 321 10*3/uL (ref 150–400)
RBC: 3.62 MIL/uL — ABNORMAL LOW (ref 3.87–5.11)
RDW: 13.1 % (ref 11.5–15.5)
WBC: 12.1 10*3/uL — ABNORMAL HIGH (ref 4.0–10.5)
nRBC: 0 % (ref 0.0–0.2)

## 2023-01-09 LAB — BASIC METABOLIC PANEL
Anion gap: 9 (ref 5–15)
BUN: 43 mg/dL — ABNORMAL HIGH (ref 8–23)
CO2: 23 mmol/L (ref 22–32)
Calcium: 8.5 mg/dL — ABNORMAL LOW (ref 8.9–10.3)
Chloride: 102 mmol/L (ref 98–111)
Creatinine, Ser: 1.44 mg/dL — ABNORMAL HIGH (ref 0.44–1.00)
GFR, Estimated: 35 mL/min — ABNORMAL LOW (ref >60)
Glucose, Bld: 110 mg/dL — ABNORMAL HIGH (ref 70–99)
Potassium: 3.8 mmol/L (ref 3.5–5.1)
Sodium: 134 mmol/L — ABNORMAL LOW (ref 135–145)

## 2023-01-09 LAB — MAGNESIUM: Magnesium: 1.9 mg/dL (ref 1.7–2.4)

## 2023-01-09 NOTE — Progress Notes (Signed)
 Mobility Specialist - Progress Note   01/09/23 1050  Oxygen Therapy  SpO2 (!) 85 %  O2 Device Room Air  Patient Activity (if Appropriate) Ambulating  Mobility  Activity Ambulated with assistance in hallway  Level of Assistance Standby assist, set-up cues, supervision of patient - no hands on  Assistive Device Front wheel walker  Distance Ambulated (ft) 80 ft  Activity Response Tolerated well  Mobility Referral Yes  Mobility visit 1 Mobility  Mobility Specialist Start Time (ACUTE ONLY) 1031  Mobility Specialist Stop Time (ACUTE ONLY) 1049  Mobility Specialist Time Calculation (min) (ACUTE ONLY) 18 min   Nurse requested Mobility Specialist to perform oxygen saturation test with pt which includes removing pt from oxygen both at rest and while ambulating.  Below are the results from that testing.     Patient Saturations on Room Air at Rest = spO2 93%  Patient Saturations on Room Air while Ambulating = sp02 85% .   Patient Saturations on 2 Liters of oxygen while Ambulating = sp02 92%  At end of testing pt left in room on 2  Liters of oxygen.  Reported results to nurse.  Pt received in bed and agreeable to mobility. Pt was minA from STS. Several coughing bouts during session. No complaints during session. Pt to recliner after session with all needs met.   [facility]

## 2023-01-09 NOTE — Plan of Care (Signed)

## 2023-01-09 NOTE — Progress Notes (Signed)
 [facility]  PROGRESS NOTE  Sabrina English FMW:[id number] DOB: 31-Oct-1933 DOA: 01/07/2023 PCP: Perri Ronal PARAS, MD   Brief HPI:    89 y.o. female with medical history significant of hypertension, hyperlipidemia, CKD stage IIIb, macular degeneration, Mnire's disease, osteoporosis, hyperthyroidism, melanoma, GERD, IBS-C, anxiety presented to hospital with shortness of breath and cough with pulse ox of 87% in the triage.  Patient was afebrile.  Initial labs showed WBC elevated at 11.9 with mild hyponatremia at 132  and hypokalemia at 2.8.  creatinine 1.6 (baseline 1.3-1.5), AST 58, ALT 53,  BNP 172, COVID/influenza/RSV PCR negative.  EKG showing left bundle branch block which is new compared to previous EKG in the chart from >8 years ago.  Chest x-ray showing bibasilar infiltrates (R>L) consistent with multifocal pneumonia. Patient was given ceftriaxone  and azithromycin  and was considered for admission to the hospital for further evaluation and treatment..     Assessment/Plan:   Acute hypoxemic respiratory failure secondary to multifocal pneumonia -Presented with hypoxemia, fever, leukocytosis on admission -Chest x-ray showed bibasilar infiltrates, consistent with multifocal pneumonia-COVID/influenza/SRV PCR negative -Started on Rocephin  and Zithromax ; procalcitonin 2.96 -Strep pneumo urinary antigen negative; Legionella pneumophila is currently pending  Hypokalemia Replete   Mild elevation of transaminases No abdominal pain or tenderness.  Alk phos and T. bili normal.   -Check LFTs in a.m.   Left bundle branch block Seen on EKG and appears new compared to previous EKG in the chart from over 8 years ago.  ACS less likely as patient is not endorsing chest pain.  Troponin 25  CKD stage IIIb Creatinine close to baseline, continue to monitor labs.    Hypertension Not on medications at home we will continue to monitor.   Hyperlipidemia Will resume simvastatin .    Hyperthyroidism Takes methimazole  every other day.  -Continue methimazole    GERD Continue Pepcid    IBS-C Anxiety Resume Xanax  from home    Medications     home med stored in pyxis  1 each Oral Daily   calcium -vitamin D   1 tablet Oral Daily   cholecalciferol   1,000 Units Oral Daily   dicyclomine   20 mg Oral Daily   enoxaparin  (LOVENOX ) injection  30 mg Subcutaneous Q24H   famotidine   20 mg Oral BID   ferrous sulfate   325 mg Oral Q breakfast   ipratropium  1 spray Nasal QID   latanoprost   1 drop Both Eyes QHS   methimazole   5 mg Oral QODAY   simvastatin   20 mg Oral q1800     Data Reviewed:   CBG:  No results for input(s): GLUCAP in the last 168 hours.  SpO2: 96 % O2 Flow Rate (L/min): 2 L/min    Vitals:   01/08/23 1553 01/08/23 1948 01/08/23 2353 01/09/23 0508  BP: (!) 117/54 (!) 110/56 (!) 102/59 (!) 108/55  Pulse: 88 97 93 78  Resp: 18 18 16 16   Temp: 98.9 F (37.2 C) 98.5 F (36.9 C) 98.2 F (36.8 C) 97.8 F (36.6 C)  TempSrc: Oral Oral Oral Oral  SpO2: 94% 96% 93% 96%  Weight:      Height:          Data Reviewed:  Basic Metabolic Panel: Recent Labs  Lab 01/07/23 2105 01/08/23 0656 01/09/23 0535  NA 132* 133* 134*  K 2.8* 3.4* 3.8  CL 98 101 102  CO2 21* 22 23  GLUCOSE 126* 114* 110*  BUN 57* 50* 43*  CREATININE 1.58* 1.41* 1.44*  CALCIUM  8.5* 8.2* 8.5*  MG  --  1.8 1.9    CBC: Recent Labs  Lab 01/07/23 2105 01/08/23 0656 01/09/23 0535  WBC 11.9* 11.4* 12.1*  NEUTROABS 9.3*  --   --   HGB 12.7 11.6* 10.9*  HCT 37.7 34.7* 32.8*  MCV 86.7 88.1 90.6  PLT 342 322 321    LFT Recent Labs  Lab 01/07/23 2105 01/08/23 0656  AST 58* 92*  ALT 53* 72*  ALKPHOS 106 101  BILITOT 0.8 0.8  PROT 6.6 5.9*  ALBUMIN 2.8* 2.4*     Antibiotics: Anti-infectives (From admission, onward)    Start     Dose/Rate Route Frequency Ordered Stop   01/08/23 2200  azithromycin  (ZITHROMAX ) 500 mg in sodium chloride  0.9 % 250 mL IVPB         500 mg 250 mL/hr over 60 Minutes Intravenous Every 24 hours 01/08/23 0002     01/08/23 2100  cefTRIAXone  (ROCEPHIN ) 1 g in sodium chloride  0.9 % 100 mL IVPB        1 g 200 mL/hr over 30 Minutes Intravenous Every 24 hours 01/08/23 0002     01/07/23 2130  cefTRIAXone  (ROCEPHIN ) 1 g in sodium chloride  0.9 % 100 mL IVPB        1 g 200 mL/hr over 30 Minutes Intravenous  Once 01/07/23 2118 01/07/23 2215   01/07/23 2130  azithromycin  (ZITHROMAX ) 500 mg in sodium chloride  0.9 % 250 mL IVPB        500 mg 250 mL/hr over 60 Minutes Intravenous  Once 01/07/23 2118 01/07/23 2318        DVT prophylaxis: Lovenox   Code Status: DNR  Family Communication: Discussed with patient's daughter at bedside   CONSULTS    Subjective   Still coughing up phlegm.   Objective    Physical Examination:   General-appears in no acute distress Heart-S1-S2, regular, no murmur auscultated Lungs-bilateral rhonchi auscultated Abdomen-soft, nontender, no organomegaly Extremities-no edema in the lower extremities Neuro-alert, oriented x3, no focal deficit noted   Status is: Inpatient:             [name] Kryslyn Helbig   [facility] If 7PM-7AM, please contact night-coverage at [email or web address], Office  [phone]   01/09/2023, 10:12 AM  LOS: 2 days

## 2023-01-10 DIAGNOSIS — J189 Pneumonia, unspecified organism: Secondary | ICD-10-CM | POA: Diagnosis not present

## 2023-01-10 DIAGNOSIS — E7849 Other hyperlipidemia: Secondary | ICD-10-CM | POA: Diagnosis not present

## 2023-01-10 DIAGNOSIS — J9601 Acute respiratory failure with hypoxia: Secondary | ICD-10-CM | POA: Diagnosis not present

## 2023-01-10 DIAGNOSIS — E0590 Thyrotoxicosis, unspecified without thyrotoxic crisis or storm: Secondary | ICD-10-CM | POA: Diagnosis not present

## 2023-01-10 LAB — CULTURE, RESPIRATORY W GRAM STAIN

## 2023-01-10 LAB — COMPREHENSIVE METABOLIC PANEL
ALT: 119 U/L — ABNORMAL HIGH (ref 0–44)
AST: 128 U/L — ABNORMAL HIGH (ref 15–41)
Albumin: 2.5 g/dL — ABNORMAL LOW (ref 3.5–5.0)
Alkaline Phosphatase: 125 U/L (ref 38–126)
Anion gap: 11 (ref 5–15)
BUN: 32 mg/dL — ABNORMAL HIGH (ref 8–23)
CO2: 23 mmol/L (ref 22–32)
Calcium: 9.2 mg/dL (ref 8.9–10.3)
Chloride: 99 mmol/L (ref 98–111)
Creatinine, Ser: 1.17 mg/dL — ABNORMAL HIGH (ref 0.44–1.00)
GFR, Estimated: 45 mL/min — ABNORMAL LOW (ref >60)
Glucose, Bld: 124 mg/dL — ABNORMAL HIGH (ref 70–99)
Potassium: 3.7 mmol/L (ref 3.5–5.1)
Sodium: 133 mmol/L — ABNORMAL LOW (ref 135–145)
Total Bilirubin: 0.6 mg/dL (ref 0.0–1.2)
Total Protein: 6.2 g/dL — ABNORMAL LOW (ref 6.5–8.1)

## 2023-01-10 LAB — LEGIONELLA PNEUMOPHILA SEROGP 1 UR AG: L. pneumophila Serogp 1 Ur Ag: NEGATIVE

## 2023-01-10 MED ORDER — ALBUTEROL SULFATE (2.5 MG/3ML) 0.083% IN NEBU
2.5000 mg | INHALATION_SOLUTION | Freq: Four times a day (QID) | RESPIRATORY_TRACT | Status: DC | PRN
  Administered 2023-01-10 (×2): 2.5 mg via RESPIRATORY_TRACT
  Filled 2023-01-10 (×2): qty 3

## 2023-01-10 MED ORDER — ACETYLCYSTEINE 20 % IN SOLN
2.0000 mL | Freq: Three times a day (TID) | RESPIRATORY_TRACT | Status: AC
  Administered 2023-01-10 (×2): 2 mL via RESPIRATORY_TRACT
  Filled 2023-01-10 (×3): qty 4

## 2023-01-10 MED ORDER — GUAIFENESIN 100 MG/5ML PO LIQD
15.0000 mL | ORAL | Status: DC
  Administered 2023-01-10 – 2023-01-11 (×2): 15 mL via ORAL
  Filled 2023-01-10 (×4): qty 20

## 2023-01-10 MED ORDER — ENOXAPARIN SODIUM 40 MG/0.4ML IJ SOSY
40.0000 mg | PREFILLED_SYRINGE | INTRAMUSCULAR | Status: DC
  Administered 2023-01-11 – 2023-01-12 (×2): 40 mg via SUBCUTANEOUS
  Filled 2023-01-10 (×2): qty 0.4

## 2023-01-10 NOTE — Plan of Care (Signed)
  Problem: Education: Goal: Knowledge of General Education information will improve Description: Including pain rating scale, medication(s)/side effects and non-pharmacologic comfort measures Outcome: Progressing   Problem: Health Behavior/Discharge Planning: Goal: Ability to manage health-related needs will improve Outcome: Progressing   Problem: Activity: Goal: Risk for activity intolerance will decrease Outcome: Progressing   Problem: Nutrition: Goal: Adequate nutrition will be maintained Outcome: Progressing   Problem: Elimination: Goal: Will not experience complications related to bowel motility Outcome: Progressing   Problem: Activity: Goal: Ability to tolerate increased activity will improve Outcome: Progressing

## 2023-01-10 NOTE — Plan of Care (Signed)
  Problem: Education: Goal: Knowledge of General Education information will improve Description: Including pain rating scale, medication(s)/side effects and non-pharmacologic comfort measures Outcome: Progressing   Problem: Clinical Measurements: Goal: Ability to maintain clinical measurements within normal limits will improve Outcome: Progressing   Problem: Elimination: Goal: Will not experience complications related to bowel motility Outcome: Progressing   Problem: Pain Management: Goal: General experience of comfort will improve Outcome: Progressing   Problem: Safety: Goal: Ability to remain free from injury will improve Outcome: Progressing

## 2023-01-10 NOTE — Progress Notes (Signed)
 [facility]  PROGRESS NOTE  Sabrina English FMW:[id number] DOB: 1933/07/08 DOA: 01/07/2023 PCP: Sabrina English   Brief HPI:    89 y.o. female with medical history significant of hypertension, hyperlipidemia, CKD stage IIIb, macular degeneration, Mnire's disease, osteoporosis, hyperthyroidism, melanoma, GERD, IBS-C, anxiety presented to hospital with shortness of breath and cough with pulse ox of 87% in the triage.  Patient was afebrile.  Initial labs showed WBC elevated at 11.9 with mild hyponatremia at 132  and hypokalemia at 2.8.  creatinine 1.6 (baseline 1.3-1.5), AST 58, ALT 53,  BNP 172, COVID/influenza/RSV PCR negative.  EKG showing left bundle branch block which is new compared to previous EKG in the chart from >8 years ago.  Chest x-ray showing bibasilar infiltrates (R>L) consistent with multifocal pneumonia. Patient was given ceftriaxone  and azithromycin  and was considered for admission to the hospital for further evaluation and treatment..     Assessment/Plan:   Acute hypoxemic respiratory failure secondary to multifocal pneumonia -Presented with hypoxemia, fever, leukocytosis on admission -Chest x-ray showed bibasilar infiltrates, consistent with multifocal pneumonia-COVID/influenza/SRV PCR negative -Started on Rocephin  and Zithromax ; procalcitonin 2.96 -Strep pneumo urinary antigen negative; Legionella pneumophila is currently pending -Start scheduled Mucinex  300 mg every 4 hours -Start Mucomyst  nebulizer 3 times daily for 3 doses -Flutter valve every 4 hours  Hypokalemia Replete   Mild elevation of transaminases No abdominal pain or tenderness.  Alk phos and T. bili normal.   -Check LFTs today -Hold atorvastatin   Left bundle branch block Seen on EKG and appears new compared to previous EKG in the chart from over 8 years ago.  ACS less likely as patient is not endorsing chest pain.  Troponin 25  CKD stage IIIb Creatinine close to baseline, continue to  monitor labs.    Hypertension Not on medications at home we will continue to monitor.   Hyperlipidemia Hold atorvastatin due to increased liver enzymes   Hyperthyroidism Takes methimazole  every other day.  -Continue methimazole    GERD Continue Pepcid    IBS-C Anxiety Resume Xanax  from home    Medications     acetylcysteine   2 mL Nebulization TID   home med stored in pyxis  1 each Oral Daily   calcium -vitamin D   1 tablet Oral Daily   cholecalciferol   1,000 Units Oral Daily   dicyclomine   20 mg Oral Daily   enoxaparin  (LOVENOX ) injection  30 mg Subcutaneous Q24H   famotidine   20 mg Oral BID   ferrous sulfate   325 mg Oral Q breakfast   guaiFENesin   15 mL Oral Q4H   ipratropium  1 spray Nasal QID   latanoprost   1 drop Both Eyes QHS   methimazole   5 mg Oral QODAY   simvastatin   20 mg Oral q1800     Data Reviewed:   CBG:  No results for input(s): GLUCAP in the last 168 hours.  SpO2: 93 % O2 Flow Rate (L/min): 2 L/min    Vitals:   01/09/23 1050 01/09/23 1420 01/09/23 2141 01/10/23 0516  BP:  (!) 105/59 113/62 113/65  Pulse:  88 87 83  Resp:   20 18  Temp:  98.5 F (36.9 C) 98.2 F (36.8 C) 98.8 F (37.1 C)  TempSrc:  Oral Oral Oral  SpO2: (!) 85% 94% 94% 93%  Weight:      Height:          Data Reviewed:  Basic Metabolic Panel: Recent Labs  Lab 01/07/23 2105 01/08/23 0656 01/09/23 0535  NA 132*  133* 134*  K 2.8* 3.4* 3.8  CL 98 101 102  CO2 21* 22 23  GLUCOSE 126* 114* 110*  BUN 57* 50* 43*  CREATININE 1.58* 1.41* 1.44*  CALCIUM  8.5* 8.2* 8.5*  MG  --  1.8 1.9    CBC: Recent Labs  Lab 01/07/23 2105 01/08/23 0656 01/09/23 0535  WBC 11.9* 11.4* 12.1*  NEUTROABS 9.3*  --   --   HGB 12.7 11.6* 10.9*  HCT 37.7 34.7* 32.8*  MCV 86.7 88.1 90.6  PLT 342 322 321    LFT Recent Labs  Lab 01/07/23 2105 01/08/23 0656  AST 58* 92*  ALT 53* 72*  ALKPHOS 106 101  BILITOT 0.8 0.8  PROT 6.6 5.9*  ALBUMIN 2.8* 2.4*      Antibiotics: Anti-infectives (From admission, onward)    Start     Dose/Rate Route Frequency Ordered Stop   01/08/23 2200  azithromycin  (ZITHROMAX ) 500 mg in sodium chloride  0.9 % 250 mL IVPB        500 mg 250 mL/hr over 60 Minutes Intravenous Every 24 hours 01/08/23 0002     01/08/23 2100  cefTRIAXone  (ROCEPHIN ) 1 g in sodium chloride  0.9 % 100 mL IVPB        1 g 200 mL/hr over 30 Minutes Intravenous Every 24 hours 01/08/23 0002     01/07/23 2130  cefTRIAXone  (ROCEPHIN ) 1 g in sodium chloride  0.9 % 100 mL IVPB        1 g 200 mL/hr over 30 Minutes Intravenous  Once 01/07/23 2118 01/07/23 2215   01/07/23 2130  azithromycin  (ZITHROMAX ) 500 mg in sodium chloride  0.9 % 250 mL IVPB        500 mg 250 mL/hr over 60 Minutes Intravenous  Once 01/07/23 2118 01/07/23 2318        DVT prophylaxis: Lovenox   Code Status: DNR  Family Communication: Discussed with patient's daughter at bedside   CONSULTS    Subjective    Patient having hard time coughing up phlegm.  Objective    Physical Examination:   Appears lethargic Lungs rhonchi auscultated bilaterally at lung bases S1-S2, regular, no murmur auscultated Abdomen is soft, nontender, no organomegaly   Status is: Inpatient:             Sabrina English   [facility] If 7PM-7AM, please contact night-coverage at [email or web address], Office  [phone]   01/10/2023, 11:50 AM  LOS: 3 days

## 2023-01-10 NOTE — Progress Notes (Signed)
 Physical Therapy Treatment Patient Details Name: Sabrina English MRN: [id number] DOB: Oct 27, 1933 Today's Date: 01/10/2023   History of Present Illness 89 y.o. female presented to hospital with shortness of breath and cough with pulse ox of 87%, admitted with Acute hypoxemic respiratory failure secondary to multifocal pneumonia PMH: hypertension, hyperlipidemia, CKD stage IIIb, macular degeneration, Mnire's disease, osteoporosis, hyperthyroidism, melanoma, GERD, IBS-C, anxiety    PT Comments  Pt coughing upon arrival, in bed with HOB elevated, daughter at bedside. Pt agreeable to ambulate and motivated with therapy. Pt slow to mobilize in bed, no physical assist but needing increased time and effort. Upon rising to EOB pt fatigued, dyspnea noted, on RA with SpO2 89%, educated on pursed lip breathing and improves SpO2 to 90%. Pt amb ~30 ft with RW, on RA with dyspnea 3/4, requesting to sit quickly due to fatiguing. Once seated, pt with increased coughing, SpO2 reads 85% and unable to improve with pursed lip breathing, returned 2L O2 and SpO2 90%. Educated on time OOB, amb to restroom with nursing, and pursed lip breathing with mobility. Notified nursing of SpO2 and return of 2L due to desating. Cont to recommend HHPT with family support at d/c.   If plan is discharge home, recommend the following: A little help with walking and/or transfers;A little help with bathing/dressing/bathroom;Assistance with cooking/housework;Assist for transportation;Help with stairs or ramp for entrance   Can travel by private vehicle        Equipment Recommendations  None recommended by PT    Recommendations for Other Services       Precautions / Restrictions Precautions Precautions: Fall Precaution Comments: monitor O2 Restrictions Weight Bearing Restrictions Per Provider Order: No     Mobility  Bed Mobility Overal bed mobility: Needs Assistance Bed Mobility: Supine to Sit     Supine to sit:  Supervision     General bed mobility comments: increased time and effort to come to sitting EOB, HOB elevated, no physical assist    Transfers Overall transfer level: Needs assistance Equipment used: Rolling walker (2 wheels) Transfers: Sit to/from Stand, Bed to chair/wheelchair/BSC Sit to Stand: Supervision   Step pivot transfers: Supervision       General transfer comment: BUE assisting to power up, step pivot to recliner at bedside, fatigued with dyspnea 3/4 on RA with SpO2 89% post transfer, supv for safety    Ambulation/Gait Ambulation/Gait assistance: Contact guard assist Gait Distance (Feet): 30 Feet Assistive device: Rolling walker (2 wheels) Gait Pattern/deviations: Step-through pattern, Trunk flexed Gait velocity: decreased     General Gait Details: step through gait pattern, trunk forward flexed, minimal bil foot clearance, dyspnea 3/4 on RA with SpO2 reading 90%, needing to sit quickly due to fatigue, SpO2 85% with seated rest and coughing on RA   Stairs             Wheelchair Mobility     Tilt Bed    Modified Rankin (Stroke Patients Only)       Balance Overall balance assessment: Needs assistance Sitting-balance support: Feet supported, No upper extremity supported Sitting balance-Leahy Scale: Fair Sitting balance - Comments: seated EOB   Standing balance support: Reliant on assistive device for balance, During functional activity Standing balance-Leahy Scale: Poor                              Cognition Arousal: Alert Behavior During Therapy: WFL for tasks assessed/performed Overall Cognitive Status: Within Functional Limits for tasks  assessed                                          Exercises      General Comments General comments (skin integrity, edema, etc.): Pt on RA upon arrival with SpO2 89%, cued for pursed lip breathing and improves to 90%. Pt amb on RA with SpO2 90%, though fatigues quickly needing  seated rest break and increasing coughing with Spo2 reading 85% and unable to improve with seated pursed lip breathing on RA so returned 2L O2 with SpO2 improving to 90%.      Pertinent Vitals/Pain Pain Assessment Pain Assessment: No/denies pain    Home Living                          Prior Function            PT Goals (current goals can now be found in the care plan section) Acute Rehab PT Goals PT Goal Formulation: With patient/family Time For Goal Achievement: 01/22/23 Potential to Achieve Goals: Good Progress towards PT goals: Progressing toward goals    Frequency    Min 1X/week      PT Plan      Co-evaluation              AM-PAC PT 6 Clicks Mobility   Outcome Measure  Help needed turning from your back to your side while in a flat bed without using bedrails?: A Little Help needed moving from lying on your back to sitting on the side of a flat bed without using bedrails?: A Little Help needed moving to and from a bed to a chair (including a wheelchair)?: A Little Help needed standing up from a chair using your arms (e.g., wheelchair or bedside chair)?: A Little Help needed to walk in hospital room?: A Little Help needed climbing 3-5 steps with a railing? : A Lot 6 Click Score: 17    End of Session Equipment Utilized During Treatment: Gait belt Activity Tolerance: Patient tolerated treatment well;Patient limited by fatigue Patient left: in chair;with call bell/phone within reach;with family/visitor present (no alarms on arrival)   PT Visit Diagnosis: Other abnormalities of gait and mobility (R26.89);Difficulty in walking, not elsewhere classified (R26.2)     Time: 8977-8953 PT Time Calculation (min) (ACUTE ONLY): 24 min  Charges:    $Gait Training: 8-22 mins $Therapeutic Activity: 8-22 mins PT General Charges $$ ACUTE PT VISIT: 1 Visit                     Tori Azul Coffie PT, DPT 01/10/23, 10:59 AM

## 2023-01-11 ENCOUNTER — Inpatient Hospital Stay (HOSPITAL_COMMUNITY): Payer: Medicare Other

## 2023-01-11 DIAGNOSIS — J189 Pneumonia, unspecified organism: Secondary | ICD-10-CM | POA: Diagnosis not present

## 2023-01-11 DIAGNOSIS — I447 Left bundle-branch block, unspecified: Secondary | ICD-10-CM

## 2023-01-11 DIAGNOSIS — J9601 Acute respiratory failure with hypoxia: Secondary | ICD-10-CM | POA: Diagnosis not present

## 2023-01-11 DIAGNOSIS — E0590 Thyrotoxicosis, unspecified without thyrotoxic crisis or storm: Secondary | ICD-10-CM | POA: Diagnosis not present

## 2023-01-11 DIAGNOSIS — E7849 Other hyperlipidemia: Secondary | ICD-10-CM | POA: Diagnosis not present

## 2023-01-11 LAB — BRAIN NATRIURETIC PEPTIDE: B Natriuretic Peptide: 166.2 pg/mL — ABNORMAL HIGH (ref 0.0–100.0)

## 2023-01-11 MED ORDER — IPRATROPIUM-ALBUTEROL 0.5-2.5 (3) MG/3ML IN SOLN
3.0000 mL | Freq: Four times a day (QID) | RESPIRATORY_TRACT | Status: DC
  Administered 2023-01-11 – 2023-01-12 (×4): 3 mL via RESPIRATORY_TRACT
  Filled 2023-01-11 (×4): qty 3

## 2023-01-11 MED ORDER — AZITHROMYCIN 250 MG PO TABS
500.0000 mg | ORAL_TABLET | Freq: Every day | ORAL | Status: AC
  Administered 2023-01-11 – 2023-01-12 (×2): 500 mg via ORAL
  Filled 2023-01-11 (×2): qty 2

## 2023-01-11 MED ORDER — IPRATROPIUM-ALBUTEROL 0.5-2.5 (3) MG/3ML IN SOLN: 3.0000 mL | Freq: Four times a day (QID) | RESPIRATORY_TRACT | Status: DC

## 2023-01-11 MED ORDER — PREDNISONE 20 MG PO TABS
40.0000 mg | ORAL_TABLET | Freq: Every day | ORAL | Status: DC
  Administered 2023-01-11 – 2023-01-12 (×2): 40 mg via ORAL
  Filled 2023-01-11 (×2): qty 2

## 2023-01-11 MED ORDER — LOPERAMIDE HCL 2 MG PO CAPS
2.0000 mg | ORAL_CAPSULE | ORAL | Status: DC | PRN
  Administered 2023-01-11: 2 mg via ORAL
  Filled 2023-01-11: qty 1

## 2023-01-11 MED ORDER — AMOXICILLIN-POT CLAVULANATE 875-125 MG PO TABS
1.0000 | ORAL_TABLET | Freq: Two times a day (BID) | ORAL | Status: DC
  Administered 2023-01-11 (×2): 1 via ORAL
  Filled 2023-01-11 (×2): qty 1

## 2023-01-11 MED ORDER — FUROSEMIDE 10 MG/ML IJ SOLN
20.0000 mg | Freq: Once | INTRAMUSCULAR | Status: AC
  Administered 2023-01-11: 20 mg via INTRAVENOUS
  Filled 2023-01-11: qty 2

## 2023-01-11 NOTE — Progress Notes (Signed)
 Mobility Specialist - Progress Note   01/11/23 1305  Mobility  Activity  (BL UE Exercises)  Range of Motion/Exercises Active Assistive;Left arm;Right arm  Activity Response Tolerated well  Mobility Referral Yes  Mobility visit 1 Mobility  Mobility Specialist Start Time (ACUTE ONLY) 1255  Mobility Specialist Stop Time (ACUTE ONLY) 1305  Mobility Specialist Time Calculation (min) (ACUTE ONLY) 10 min   Pt received in bed and agreeable to do UE exercises. See below for exercises. No complaints during session. Pt to bed after session with all needs met.    Seated ULE Exercises:  reps each, yellow Resistance band  1) Elbow Flexion  2) Ball Squeezes  3) Shoulder Horizontal Abduction  4) Diagonal Reach  [facility]

## 2023-01-11 NOTE — Progress Notes (Signed)
 [facility]  PROGRESS NOTE  Sabrina English FMW:[id number] DOB: [birth year] DOA: 01/07/2023 PCP: Sabrina Ronal PARAS, MD   Brief HPI:    89 y.o. female with medical history significant of hypertension, hyperlipidemia, CKD stage IIIb, macular degeneration, Mnire's disease, osteoporosis, hyperthyroidism, melanoma, GERD, IBS-C, anxiety presented to hospital with shortness of breath and cough with pulse ox of 87% in the triage.  Patient was afebrile.  Initial labs showed WBC elevated at 11.9 with mild hyponatremia at 132  and hypokalemia at 2.8.  creatinine 1.6 (baseline 1.3-1.5), AST 58, ALT 53,  BNP 172, COVID/influenza/RSV PCR negative.  EKG showing left bundle branch block which is new compared to previous EKG in the chart from >8 years ago.  Chest x-ray showing bibasilar infiltrates (R>L) consistent with multifocal pneumonia. Patient was given ceftriaxone  and azithromycin  and was considered for admission to the hospital for further evaluation and treatment..     Assessment/Plan:   Acute hypoxemic respiratory failure secondary to multifocal pneumonia -Presented with hypoxemia, fever, leukocytosis on admission -Chest x-ray showed bibasilar infiltrates, consistent with multifocal pneumonia-COVID/influenza/SRV PCR negative -Started on Rocephin  and Zithromax ; procalcitonin 2.96 -Strep pneumo urinary antigen negative; Legionella pneumophila is negative -Received Mucinex , Mucomyst  nebulizer, flutter valve -Slowly improving -Will discontinue Rocephin  and Zithromax  and start Augmentin  and p.o. Zithromax  -Start DuoNeb nebulizers every 6 hours, p.o. prednisone  -Wean off oxygen as tolerated -Check BNP and give Lasix  if elevated   Hypokalemia Replete   Mild elevation of transaminases No abdominal pain or tenderness.  Alk phos and T. bili normal.   -Check LFTs today -Hold atorvastatin -Follow CMP in a.m. if no improvement consider abdominal ultrasound  Left bundle branch block Seen on EKG  and appears new compared to previous EKG in the chart from over 8 years ago.  ACS less likely as patient is not endorsing chest pain.  Troponin 25  CKD stage IIIb Creatinine close to baseline, continue to monitor labs.   Hypertension Not on medications at home we will continue to monitor.   Hyperlipidemia Hold atorvastatin due to increased liver enzymes   Hyperthyroidism Takes methimazole  every other day.  -Continue methimazole    GERD Continue Pepcid    IBS-C Anxiety Resume Xanax  from home    Medications     amoxicillin -clavulanate  1 tablet Oral Q12H   azithromycin   500 mg Oral Daily   home med stored in pyxis  1 each Oral Daily   calcium -vitamin D   1 tablet Oral Daily   cholecalciferol   1,000 Units Oral Daily   dicyclomine   20 mg Oral Daily   enoxaparin  (LOVENOX ) injection  40 mg Subcutaneous Q24H   famotidine   20 mg Oral BID   ferrous sulfate   325 mg Oral Q breakfast   ipratropium  1 spray Nasal QID   ipratropium-albuterol   3 mL Nebulization Q6H   latanoprost   1 drop Both Eyes QHS   methimazole   5 mg Oral QODAY   predniSONE   40 mg Oral Q breakfast     Data Reviewed:   CBG:  No results for input(s): GLUCAP in the last 168 hours.  SpO2: 96 % O2 Flow Rate (L/min): 3 L/min    Vitals:   01/10/23 2148 01/11/23 0514 01/11/23 1113 01/11/23 1343  BP: (!) 107/51 (!) 115/57  117/64  Pulse: 89 82  84  Resp: 20 20  20   Temp: 98.5 F (36.9 C) 98.1 F (36.7 C)  98.3 F (36.8 C)  TempSrc: Oral Oral  Oral  SpO2: 95% 97% 95% 96%  Weight:  Height:          Data Reviewed:  Basic Metabolic Panel: Recent Labs  Lab 01/07/23 2105 01/08/23 0656 01/09/23 0535 01/10/23 1228  NA 132* 133* 134* 133*  K 2.8* 3.4* 3.8 3.7  CL 98 101 102 99  CO2 21* 22 23 23   GLUCOSE 126* 114* 110* 124*  BUN 57* 50* 43* 32*  CREATININE 1.58* 1.41* 1.44* 1.17*  CALCIUM  8.5* 8.2* 8.5* 9.2  MG  --  1.8 1.9  --     CBC: Recent Labs  Lab 01/07/23 2105 01/08/23 0656  01/09/23 0535  WBC 11.9* 11.4* 12.1*  NEUTROABS 9.3*  --   --   HGB 12.7 11.6* 10.9*  HCT 37.7 34.7* 32.8*  MCV 86.7 88.1 90.6  PLT 342 322 321    LFT Recent Labs  Lab 01/07/23 2105 01/08/23 0656 01/10/23 1228  AST 58* 92* 128*  ALT 53* 72* 119*  ALKPHOS 106 101 125  BILITOT 0.8 0.8 0.6  PROT 6.6 5.9* 6.2*  ALBUMIN 2.8* 2.4* 2.5*     Antibiotics: Anti-infectives (From admission, onward)    Start     Dose/Rate Route Frequency Ordered Stop   01/11/23 1100  amoxicillin -clavulanate (AUGMENTIN ) 875-125 MG per tablet 1 tablet        1 tablet Oral Every 12 hours 01/11/23 1001     01/11/23 1100  azithromycin  (ZITHROMAX ) tablet 500 mg        500 mg Oral Daily 01/11/23 1001 01/13/23 0959   01/08/23 2200  azithromycin  (ZITHROMAX ) 500 mg in sodium chloride  0.9 % 250 mL IVPB  Status:  Discontinued        500 mg 250 mL/hr over 60 Minutes Intravenous Every 24 hours 01/08/23 0002 01/11/23 1001   01/08/23 2100  cefTRIAXone  (ROCEPHIN ) 1 g in sodium chloride  0.9 % 100 mL IVPB  Status:  Discontinued        1 g 200 mL/hr over 30 Minutes Intravenous Every 24 hours 01/08/23 0002 01/11/23 1001   01/07/23 2130  cefTRIAXone  (ROCEPHIN ) 1 g in sodium chloride  0.9 % 100 mL IVPB        1 g 200 mL/hr over 30 Minutes Intravenous  Once 01/07/23 2118 01/07/23 2215   01/07/23 2130  azithromycin  (ZITHROMAX ) 500 mg in sodium chloride  0.9 % 250 mL IVPB        500 mg 250 mL/hr over 60 Minutes Intravenous  Once 01/07/23 2118 01/07/23 2318        DVT prophylaxis: Lovenox   Code Status: DNR  Family Communication: Discussed with patient's daughter at bedside   CONSULTS    Subjective    Coughing has improved.  Though she still feels very weak.  Objective    Physical Examination:   Appears lethargic S1-S2, regular Lungs bilateral rhonchi auscultated at lung bases Abdomen is soft, nontender, no organomegaly   Status is: Inpatient:             Sabrina English Sabrina English   [facility] If 7PM-7AM, please contact night-coverage at [email or web address], Office  [phone]   01/11/2023, 3:48 PM  LOS: 4 days

## 2023-01-11 NOTE — Plan of Care (Signed)
  Problem: Education: Goal: Knowledge of General Education information will improve Description: Including pain rating scale, medication(s)/side effects and non-pharmacologic comfort measures Outcome: Progressing   Problem: Coping: Goal: Level of anxiety will decrease Outcome: Progressing   Problem: Pain Management: Goal: General experience of comfort will improve Outcome: Progressing   Problem: Skin Integrity: Goal: Risk for impaired skin integrity will decrease Outcome: Progressing   Problem: Respiratory: Goal: Ability to maintain adequate ventilation will improve Outcome: Progressing

## 2023-01-11 NOTE — Progress Notes (Signed)
 Mobility Specialist - Progress Note   01/11/23 1030  Mobility  Activity Stood at bedside  Level of Assistance Standby assist, set-up cues, supervision of patient - no hands on  Assistive Device Front wheel walker  Activity Response Tolerated well  Mobility Referral Yes  Mobility visit 1 Mobility  Mobility Specialist Start Time (ACUTE ONLY) 1006  Mobility Specialist Stop Time (ACUTE ONLY) 1028  Mobility Specialist Time Calculation (min) (ACUTE ONLY) 22 min   Pt received in recliner and agreeable to mobility. Once standing pt immediately sat back down d/t feeling weak. Assisted pt with seated exercises (see below). RN made aware of session. No other complaints during session. Pt to recliner after session with all needs met.   Seated BLE Exercises: 10 reps each  2) Knee Extension (hold 3 seconds)  3) Marching     [employer]

## 2023-01-11 NOTE — Plan of Care (Signed)
  Problem: Education: Goal: Knowledge of General Education information will improve Description: Including pain rating scale, medication(s)/side effects and non-pharmacologic comfort measures Outcome: Progressing   Problem: Health Behavior/Discharge Planning: Goal: Ability to manage health-related needs will improve Outcome: Progressing   Problem: Clinical Measurements: Goal: Ability to maintain clinical measurements within normal limits will improve Outcome: Progressing Goal: Will remain free from infection Outcome: Progressing Goal: Diagnostic test results will improve Outcome: Progressing Goal: Respiratory complications will improve Outcome: Progressing Goal: Cardiovascular complication will be avoided Outcome: Progressing   Problem: Activity: Goal: Risk for activity intolerance will decrease Outcome: Progressing   Problem: Nutrition: Goal: Adequate nutrition will be maintained Outcome: Progressing   Problem: Coping: Goal: Level of anxiety will decrease Outcome: Progressing   Problem: Elimination: Goal: Will not experience complications related to bowel motility Outcome: Progressing Goal: Will not experience complications related to urinary retention Outcome: Progressing   Problem: Pain Management: Goal: General experience of comfort will improve Outcome: Progressing   Problem: Safety: Goal: Ability to remain free from injury will improve Outcome: Progressing   Problem: Skin Integrity: Goal: Risk for impaired skin integrity will decrease Outcome: Progressing   Problem: Clinical Measurements: Goal: Ability to maintain a body temperature in the normal range will improve Outcome: Progressing   Problem: Respiratory: Goal: Ability to maintain adequate ventilation will improve Outcome: Progressing Goal: Ability to maintain a clear airway will improve Outcome: Progressing

## 2023-01-12 ENCOUNTER — Inpatient Hospital Stay (HOSPITAL_COMMUNITY): Payer: Medicare Other

## 2023-01-12 DIAGNOSIS — I509 Heart failure, unspecified: Secondary | ICD-10-CM | POA: Diagnosis not present

## 2023-01-12 DIAGNOSIS — J189 Pneumonia, unspecified organism: Secondary | ICD-10-CM | POA: Diagnosis not present

## 2023-01-12 LAB — COMPREHENSIVE METABOLIC PANEL
ALT: 94 U/L — ABNORMAL HIGH (ref 0–44)
AST: 64 U/L — ABNORMAL HIGH (ref 15–41)
Albumin: 2.2 g/dL — ABNORMAL LOW (ref 3.5–5.0)
Alkaline Phosphatase: 103 U/L (ref 38–126)
Anion gap: 12 (ref 5–15)
BUN: 33 mg/dL — ABNORMAL HIGH (ref 8–23)
CO2: 23 mmol/L (ref 22–32)
Calcium: 9 mg/dL (ref 8.9–10.3)
Chloride: 97 mmol/L — ABNORMAL LOW (ref 98–111)
Creatinine, Ser: 1.43 mg/dL — ABNORMAL HIGH (ref 0.44–1.00)
GFR, Estimated: 35 mL/min — ABNORMAL LOW (ref >60)
Glucose, Bld: 145 mg/dL — ABNORMAL HIGH (ref 70–99)
Potassium: 3.4 mmol/L — ABNORMAL LOW (ref 3.5–5.1)
Sodium: 132 mmol/L — ABNORMAL LOW (ref 135–145)
Total Bilirubin: 0.5 mg/dL (ref 0.0–1.2)
Total Protein: 5.9 g/dL — ABNORMAL LOW (ref 6.5–8.1)

## 2023-01-12 LAB — ECHOCARDIOGRAM COMPLETE
AR max vel: 1.56 cm2
AV Area VTI: 1.54 cm2
AV Area mean vel: 1.47 cm2
AV Mean grad: 19 mm[Hg]
AV Peak grad: 32.7 mm[Hg]
Ao pk vel: 2.86 m/s
Area-P 1/2: 3.53 cm2
Calc EF: 68.7 %
Height: 61.5 in
S' Lateral: 2.6 cm
Single Plane A2C EF: 68.8 %
Single Plane A4C EF: 69.2 %
Weight: 2560 [oz_av]

## 2023-01-12 MED ORDER — AMOXICILLIN-POT CLAVULANATE 500-125 MG PO TABS
1.0000 | ORAL_TABLET | Freq: Two times a day (BID) | ORAL | Status: DC
  Administered 2023-01-12 – 2023-01-13 (×3): 1 via ORAL
  Filled 2023-01-12 (×3): qty 1

## 2023-01-12 MED ORDER — POTASSIUM CHLORIDE CRYS ER 20 MEQ PO TBCR
40.0000 meq | EXTENDED_RELEASE_TABLET | Freq: Once | ORAL | Status: AC
  Administered 2023-01-12: 40 meq via ORAL
  Filled 2023-01-12: qty 2

## 2023-01-12 MED ORDER — METHYLPREDNISOLONE SODIUM SUCC 125 MG IJ SOLR
80.0000 mg | Freq: Every day | INTRAMUSCULAR | Status: DC
  Administered 2023-01-12 – 2023-01-13 (×2): 80 mg via INTRAVENOUS
  Filled 2023-01-12 (×2): qty 2

## 2023-01-12 MED ORDER — IPRATROPIUM-ALBUTEROL 0.5-2.5 (3) MG/3ML IN SOLN
3.0000 mL | Freq: Two times a day (BID) | RESPIRATORY_TRACT | Status: DC
  Administered 2023-01-12 – 2023-01-13 (×2): 3 mL via RESPIRATORY_TRACT
  Filled 2023-01-12 (×2): qty 3

## 2023-01-12 MED ORDER — PERFLUTREN LIPID MICROSPHERE
1.0000 mL | INTRAVENOUS | Status: AC | PRN
  Administered 2023-01-12: 1 mL via INTRAVENOUS

## 2023-01-12 MED ORDER — ENOXAPARIN SODIUM 30 MG/0.3ML IJ SOSY
30.0000 mg | PREFILLED_SYRINGE | INTRAMUSCULAR | Status: DC
  Administered 2023-01-13: 30 mg via SUBCUTANEOUS
  Filled 2023-01-12: qty 0.3

## 2023-01-12 MED ORDER — MOMETASONE FURO-FORMOTEROL FUM 100-5 MCG/ACT IN AERO
2.0000 | INHALATION_SPRAY | Freq: Two times a day (BID) | RESPIRATORY_TRACT | Status: DC
  Administered 2023-01-12 – 2023-01-13 (×2): 2 via RESPIRATORY_TRACT
  Filled 2023-01-12: qty 8.8

## 2023-01-12 MED ORDER — FUROSEMIDE 10 MG/ML IJ SOLN
20.0000 mg | Freq: Once | INTRAMUSCULAR | Status: AC
  Administered 2023-01-12: 20 mg via INTRAVENOUS
  Filled 2023-01-12: qty 2

## 2023-01-12 NOTE — Progress Notes (Signed)
 Physical Therapy Treatment Patient Details Name: Sabrina English MRN: [id number] DOB: 20-Aug-1933 Today's Date: 01/12/2023   History of Present Illness 89 y.o. female presented to hospital with shortness of breath and cough with pulse ox of 87%, admitted with Acute hypoxemic respiratory failure secondary to multifocal pneumonia PMH: hypertension, hyperlipidemia, CKD stage IIIb, macular degeneration, Mnire's disease, osteoporosis, hyperthyroidism, melanoma, GERD, IBS-C, anxiety    PT Comments  Pt received in bed with family present, pleasant and cooperative throughout session today. Able to mobilize with only light levels of physical assistance but very easily fatigued and from her/daughter's report, very far off from her baseline. Increased gait distance slightly today. See accompanying SpO2 note for sats but had severe SOB/WOB on RA with sats maintaining at 90% on RA. Left up in recliner with all needs met, alarm active. Per session today, feel that she is more appropriate for short term rehab prior to return home, will relay this to care team- pt and family in agreement that rehab prior to home would be very beneficial.     If plan is discharge home, recommend the following: A little help with walking and/or transfers;A little help with bathing/dressing/bathroom;Assistance with cooking/housework;Assist for transportation;Help with stairs or ramp for entrance   Can travel by private vehicle     Yes  Equipment Recommendations  Other (comment) (defer to next venue)    Recommendations for Other Services       Precautions / Restrictions Precautions Precautions: Fall Precaution Comments: monitor O2 Restrictions Weight Bearing Restrictions Per Provider Order: No     Mobility  Bed Mobility Overal bed mobility: Needs Assistance Bed Mobility: Supine to Sit     Supine to sit: Supervision, HOB elevated, Used rails     General bed mobility comments: extended time/increased effort, no  physical assist given    Transfers Overall transfer level: Needs assistance Equipment used: Rolling walker (2 wheels) Transfers: Sit to/from Stand Sit to Stand: Supervision           General transfer comment: assist for O2 line management, no physical assist given    Ambulation/Gait Ambulation/Gait assistance: Contact guard assist Gait Distance (Feet): 35 Feet Assistive device: Rolling walker (2 wheels) Gait Pattern/deviations: Step-through pattern, Trunk flexed, Narrow base of support, Decreased step length - right, Decreased step length - left Gait velocity: decreased     General Gait Details: very easily fatigued with gait, increased WOB and significant SOB with cues for PLB given but spO2 90% on RA   Stairs             Wheelchair Mobility     Tilt Bed    Modified Rankin (Stroke Patients Only)       Balance Overall balance assessment: Needs assistance Sitting-balance support: Feet supported, No upper extremity supported Sitting balance-Leahy Scale: Good Sitting balance - Comments: seated EOB   Standing balance support: Reliant on assistive device for balance, During functional activity Standing balance-Leahy Scale: Poor                              Cognition Arousal: Alert Behavior During Therapy: WFL for tasks assessed/performed Overall Cognitive Status: Within Functional Limits for tasks assessed                                          Exercises  General Comments General comments (skin integrity, edema, etc.): on RA in bed 88%, but able to maintain at 90% on RA with activity despite increased WOB and severe SOB      Pertinent Vitals/Pain Pain Assessment Pain Assessment: No/denies pain    Home Living                          Prior Function            PT Goals (current goals can now be found in the care plan section) Acute Rehab PT Goals PT Goal Formulation: With patient/family Time For  Goal Achievement: 01/22/23 Potential to Achieve Goals: Good Progress towards PT goals: Progressing toward goals    Frequency    Min 1X/week      PT Plan      Co-evaluation              AM-PAC PT 6 Clicks Mobility   Outcome Measure  Help needed turning from your back to your side while in a flat bed without using bedrails?: A Little Help needed moving from lying on your back to sitting on the side of a flat bed without using bedrails?: A Little Help needed moving to and from a bed to a chair (including a wheelchair)?: A Little Help needed standing up from a chair using your arms (e.g., wheelchair or bedside chair)?: A Little Help needed to walk in hospital room?: A Little Help needed climbing 3-5 steps with a railing? : A Lot 6 Click Score: 17    End of Session Equipment Utilized During Treatment: Gait belt Activity Tolerance: Patient tolerated treatment well;Patient limited by fatigue Patient left: in chair;with call bell/phone within reach;with family/visitor present;with chair alarm set Nurse Communication: Mobility status PT Visit Diagnosis: Other abnormalities of gait and mobility (R26.89);Difficulty in walking, not elsewhere classified (R26.2)     Time: 9095-9075 PT Time Calculation (min) (ACUTE ONLY): 20 min  Charges:    $Gait Training: 8-22 mins PT General Charges $$ ACUTE PT VISIT: 1 Visit                    Josette Rough, PT, DPT 01/12/23 9:35 AM

## 2023-01-12 NOTE — Progress Notes (Signed)
 PHARMACY NOTE:  ANTIMICROBIAL RENAL DOSAGE ADJUSTMENT  Current antimicrobial regimen includes a mismatch between antimicrobial dosage and estimated renal function. As per policy approved by the Pharmacy & Therapeutics and Medical Executive Committees, the antimicrobial dosage will be adjusted accordingly.  Current antimicrobial and dosage:  Augmentin  875 mg q12 hr  Indication: multifocal CAP  Renal Function:   Estimated Creatinine Clearance: 25.6 mL/min (A) (by C-G formula based on SCr of 1.43 mg/dL (H)). []      On intermittent HD, scheduled: []      On CRRT    Antimicrobial dosage has been changed to:  500 mg q12 hr   Additional Comments: despite lower SCr on 1/7; all levels recent levels prior to this admission appear to be in the 1.3-1.5 range (keeping CrCl < 30)   Thank you for allowing pharmacy to be a part of this patient's care.  Bard Jeans, PharmD, BCPS [phone] 01/12/2023, 8:17 AM

## 2023-01-12 NOTE — NC FL2 (Signed)
 [place]  MEDICAID FL2 LEVEL OF CARE FORM     IDENTIFICATION  Patient Name: Sabrina English Birthdate: [birth year] Sex: female Admission Date (Current Location): 01/07/2023  [facility] and [place] Number:  Producer, Television/film/video and Address:  [facility],  [address], [place] [address]      Provider Number: [id number]  Attending Physician Name and Address:  Cheryle Page, MD  Relative Name and Phone Number:  Reena Copa [phone]    Current Level of Care: Hospital Recommended Level of Care: Skilled Nursing Facility Prior Approval Number:    Date Approved/Denied:   PASRR Number: [id number]  Discharge Plan: SNF    Current Diagnoses: Patient Active Problem List   Diagnosis Date Noted   Acute hypoxemic respiratory failure (HCC) 01/08/2023   LBBB (left bundle branch block) 01/08/2023   Hypokalemia 01/08/2023   Hyponatremia 01/08/2023   Multifocal pneumonia 01/07/2023   Renal insufficiency 02/27/2019   Anemia associated with chronic renal failure 02/27/2019   Hyperthyroidism 09/01/2018   Pulmonary nodules 06/05/2014   Macular degeneration of both eyes 08/16/2011   Meniere disease 05/27/2010   Hyperlipidemia 05/27/2010   GE reflux 05/27/2010   Osteopenia 05/27/2010    Orientation RESPIRATION BLADDER Height & Weight     Self, Time, Situation, Place  Normal Continent Weight: 72.6 kg Height:  5' 1.5 (156.2 cm)  BEHAVIORAL SYMPTOMS/MOOD NEUROLOGICAL BOWEL NUTRITION STATUS     (n/a) Continent Diet  AMBULATORY STATUS COMMUNICATION OF NEEDS Skin   Supervision Verbally Normal                       Personal Care Assistance Level of Assistance  Bathing, Feeding, Dressing Bathing Assistance: Limited assistance Feeding assistance: Independent Dressing Assistance: Limited assistance     Functional Limitations Info  Sight, Hearing, Speech   Hearing Info: Adequate Speech Info: Adequate    SPECIAL CARE FACTORS FREQUENCY  PT (By licensed  PT), OT (By licensed OT)     PT Frequency: 5x/wk OT Frequency: 5x/wk            Contractures Contractures Info: Not present    Additional Factors Info  Code Status, Psychotropic, Allergies, Insulin Sliding Scale, Isolation Precautions, Suctioning Needs Code Status Info: DNR Allergies Info: Levaquin  (Levofloxacin  In D5w) Psychotropic Info: n/a see discharge summary Insulin Sliding Scale Info: see discharge summary Isolation Precautions Info: n/a Suctioning Needs: n/a   Current Medications (01/12/2023):  This is the current hospital active medication list Current Facility-Administered Medications  Medication Dose Route Frequency Provider Last Rate Last Admin   acetaminophen  (TYLENOL ) tablet 650 mg  650 mg Oral Q6H PRN Alfornia Madison, MD       Or   acetaminophen  (TYLENOL ) suppository 650 mg  650 mg Rectal Q6H PRN Alfornia Madison, MD       albuterol  (PROVENTIL ) (2.5 MG/3ML) 0.083% nebulizer solution 2.5 mg  2.5 mg Nebulization Q6H PRN Lama, Gagan S, MD   2.5 mg at 01/10/23 1950   ALPRAZolam  (XANAX ) tablet 0.25 mg  0.25 mg Oral QHS PRN Pokhrel, Laxman, MD   0.25 mg at 01/11/23 2100   amoxicillin -clavulanate (AUGMENTIN ) 500-125 MG per tablet 1 tablet  1 tablet Oral BID Mark Bard LABOR, RPH   1 tablet at 01/12/23 0946   betahistine dihydrochloride 8mg  (patient supplied home med)  1 each Oral Daily Pokhrel, Laxman, MD   1 each at 01/12/23 1232   calcium -vitamin D  (OSCAL WITH D) 500-5 MG-MCG per tablet 1 tablet  1 tablet Oral Daily  Pokhrel, Laxman, MD   1 tablet at 01/12/23 0947   cholecalciferol  (VITAMIN D3) 25 MCG (1000 UNIT) tablet 1,000 Units  1,000 Units Oral Daily Pokhrel, Laxman, MD   1,000 Units at 01/12/23 0947   cyclobenzaprine  (FLEXERIL ) tablet 10 mg  10 mg Oral Daily PRN Pokhrel, Laxman, MD       dicyclomine  (BENTYL ) tablet 20 mg  20 mg Oral Daily Pokhrel, Laxman, MD   20 mg at 01/12/23 0947   enoxaparin  (LOVENOX ) injection 40 mg  40 mg Subcutaneous Q24H Shade, Christine E,  RPH   40 mg at 01/12/23 9052   famotidine  (PEPCID ) tablet 20 mg  20 mg Oral BID Pokhrel, Laxman, MD   20 mg at 01/12/23 9052   ferrous sulfate  tablet 325 mg  325 mg Oral Q breakfast Pokhrel, Laxman, MD   325 mg at 01/12/23 0807   ipratropium (ATROVENT ) 0.06 % nasal spray 1 spray  1 spray Nasal QID Pokhrel, Laxman, MD   1 spray at 01/12/23 0948   ipratropium-albuterol  (DUONEB) 0.5-2.5 (3) MG/3ML nebulizer solution 3 mL  3 mL Nebulization BID Alekh, Kshitiz, MD       latanoprost  (XALATAN ) 0.005 % ophthalmic solution 1 drop  1 drop Both Eyes QHS Pokhrel, Laxman, MD   1 drop at 01/11/23 2100   loperamide  (IMODIUM ) capsule 2 mg  2 mg Oral PRN Lama, Gagan S, MD   2 mg at 01/11/23 1802   loratadine  (CLARITIN ) tablet 10 mg  10 mg Oral Daily PRN Rathore, Vasundhra, MD   10 mg at 01/08/23 0149   meclizine  (ANTIVERT ) tablet 25 mg  25 mg Oral TID PRN Pokhrel, Laxman, MD       methimazole  (TAPAZOLE ) tablet 5 mg  5 mg Oral QODAY Pokhrel, Laxman, MD   5 mg at 01/12/23 0946   methylPREDNISolone  sodium succinate (SOLU-MEDROL ) 125 mg/2 mL injection 80 mg  80 mg Intravenous Daily Alekh, Kshitiz, MD   80 mg at 01/12/23 1340   mometasone -formoterol  (DULERA ) 100-5 MCG/ACT inhaler 2 puff  2 puff Inhalation BID Cheryle Page, MD       ondansetron  (ZOFRAN ) injection 4 mg  4 mg Intravenous Q6H PRN Pokhrel, Laxman, MD   4 mg at 01/08/23 1329     Discharge Medications: Please see discharge summary for a list of discharge medications.  Relevant Imaging Results:  Relevant Lab Results:   Additional Information SS# [id number]  Toy LITTIE Agar, RN

## 2023-01-12 NOTE — TOC Initial Note (Signed)
 Transition of Care [facility]) - Initial/Assessment Note    Patient Details  Name: Sabrina English MRN: [id number] Date of Birth: 10-10-1933  [facility] CM/SW Contact:    Toy LITTIE Agar, RN Phone Number:[phone]  01/12/2023, 2:22 PM  Clinical Narrative:                 TOC acknowledges recommendation for SNF. CM at bedside to offer choice. Patient and daughter both agreeable to SNF workup with 1st choice [facility] and 2nd [facility]. Info has been faxed out for bed offers. TOC will continue to follow up.   Expected Discharge Plan: Skilled Nursing Facility Barriers to Discharge: SNF Pending discharge orders, Continued Medical Work up   Patient Goals and CMS Choice Patient states their goals for this hospitalization and ongoing recovery are:: Wants to go to rehab to get stronger before going home CMS Medicare.gov Compare Post Acute Care list provided to:: Patient Choice offered to / list presented to : Patient, Adult Children [place] ownership interest in [facility].provided to:: Patient    Expected Discharge Plan and Services   Discharge Planning Services: CM Consult Post Acute Care Choice: Skilled Nursing Facility Living arrangements for the past 2 months: Single Family Home                 DME Arranged: N/A DME Agency: NA       HH Arranged: NA HH Agency: NA        Prior Living Arrangements/Services Living arrangements for the past 2 months: Single Family Home Lives with:: Self Patient language and need for interpreter reviewed:: Yes Do you feel safe going back to the place where you live?: Yes      Need for Family Participation in Patient Care: Yes (Comment) Care giver support system in place?: Yes (comment) Current home services:  (n/a) Criminal Activity/Legal Involvement Pertinent to Current Situation/Hospitalization: No - Comment as needed  Activities of Daily Living   ADL Screening (condition at time of admission) Independently  performs ADLs?: Yes (appropriate for developmental age) Is the patient deaf or have difficulty hearing?: No Does the patient have difficulty seeing, even when wearing glasses/contacts?: No Does the patient have difficulty concentrating, remembering, or making decisions?: No  Permission Sought/Granted Permission sought to share information with : Family Supports Permission granted to share information with : Yes, Verbal Permission Granted  Share Information with NAME: Reena Copa [phone]     Permission granted to share info w Relationship: daughter  Permission granted to share info w Contact Information: [phone]  Emotional Assessment Appearance:: Appears stated age Attitude/Demeanor/Rapport: Gracious Affect (typically observed): Pleasant, Quiet Orientation: : Oriented to Self, Oriented to Place, Oriented to  Time, Oriented to Situation Alcohol / Substance Use: Not Applicable Psych Involvement: No (comment)  Admission diagnosis:  Pneumonia of both lower lobes due to infectious organism [J18.9] Multifocal pneumonia [J18.9] Patient Active Problem List   Diagnosis Date Noted   Acute hypoxemic respiratory failure (HCC) 01/08/2023   LBBB (left bundle branch block) 01/08/2023   Hypokalemia 01/08/2023   Hyponatremia 01/08/2023   Multifocal pneumonia 01/07/2023   Renal insufficiency 02/27/2019   Anemia associated with chronic renal failure 02/27/2019   Hyperthyroidism 09/01/2018   Pulmonary nodules 06/05/2014   Macular degeneration of both eyes 08/16/2011   Meniere disease 05/27/2010   Hyperlipidemia 05/27/2010   GE reflux 05/27/2010   Osteopenia 05/27/2010   PCP:  Perri Ronal PARAS, MD Pharmacy:   [facility] [phone] - [place], [place] - [address] [place] [address] Phone: [phone] Fax: [phone]  [facility] Delivery - [place], [place] - [phone] [address] [place] [address] Phone: [phone] Fax:  [phone]     Social Drivers of Health (SDOH) Social History: SDOH Screenings   Food Insecurity: No Food Insecurity (01/08/2023)  Housing: Low Risk  (01/08/2023)  Transportation Needs: No Transportation Needs (01/08/2023)  Utilities: Not At Risk (01/08/2023)  Alcohol Screen: Low Risk  (06/20/2019)  Depression (PHQ2-9): Low Risk  (07/01/2022)  Financial Resource Strain: Low Risk  (06/18/2018)  Physical Activity: Insufficiently Active (06/18/2018)  Social Connections: Moderately Isolated (01/08/2023)  Stress: No Stress Concern Present (06/18/2018)  Tobacco Use: Low Risk  (01/07/2023)   SDOH Interventions:     Readmission Risk Interventions    01/12/2023    2:08 PM  Readmission Risk Prevention Plan  Transportation Screening Complete  PCP or Specialist Appt within 5-7 Days Complete  Home Care Screening Complete  Medication Review (RN CM) Referral to Pharmacy

## 2023-01-12 NOTE — Plan of Care (Signed)
  Problem: Elimination: Goal: Will not experience complications related to bowel motility Outcome: Progressing   Problem: Pain Management: Goal: General experience of comfort will improve Outcome: Progressing   Problem: Safety: Goal: Ability to remain free from injury will improve Outcome: Progressing   Problem: Respiratory: Goal: Ability to maintain adequate ventilation will improve Outcome: Progressing

## 2023-01-12 NOTE — Progress Notes (Signed)
 Please see in addition to PT note:   SATURATION QUALIFICATIONS: (This note is used to comply with regulatory documentation for home oxygen)   Patient Saturations on Room Air at Rest = 90%   Patient Saturations on Room Air while Ambulating = 90%   Patient Saturations on 2 Liters of oxygen while Ambulating = DNT, sats held at 90%    Please briefly explain why patient needs home oxygen: n/a per Spo2 values  Josette Rough, PT, DPT 01/12/23 9:36 AM

## 2023-01-12 NOTE — Progress Notes (Addendum)
 PROGRESS NOTE    Sabrina English  FMW:[id number] DOB: 03/21/1933 DOA: 01/07/2023 PCP: Perri Ronal PARAS, MD   Brief Narrative:   89 y.o. female with medical history significant of hypertension, hyperlipidemia, CKD stage IIIb, macular degeneration, Mnire's disease, osteoporosis, hyperthyroidism, melanoma, GERD, IBS-C, anxiety presented with worsening shortness of breath and cough.  On presentation, COVID-19/influenza/RSV PCR was negative.  Chest x-ray showed bibasilar infiltrates consistent with multifocal pneumonia.  She was started on IV antibiotics.  PT subsequently recommended SNF placement.  TOC following.  Assessment & Plan:   Acute respiratory failure with hypoxia Multifocal pneumonia -Presented with multifocal pneumonia.  COVID/influenza/RSV PCR negative on presentation.  Respiratory panel PCR negative. -Still requiring 3 L oxygen via nasal cannula.  Repeat chest x-ray on 01/11/2023 showed worsening bilateral airspace disease.  Currently on Augmentin  and Zithromax : Finish 7-day course of therapy.  Patient received 1 dose of IV Lasix  on 01/11/2023.  Will give another dose of IV Lasix  today.  2D echo pending.  Will empirically start Solu-Medrol  as well. -Wean off oxygen as able.  Continue nebs.  Elevated LFTs -Mild.  Improving.  Monitor.  Statin on hold  Hypokalemia -Replace.  Repeat a.m. labs  Hyponatremia -Mild.  Monitor  Leukocytosis -No labs today.  Monitor  Anemia of chronic disease -From chronic illnesses.  Hemoglobin currently stable.  Monitor intermittently  CKD stage IIIb -Creatinine currently stable.  Monitor intermittently.  Left bundle branch block -Seen on EKG and appears new compared to previous EKG in the chart from over 8 years ago.  ACS less likely as patient is not endorsing chest pain.  No further intervention needed.  Follow 2D echo  Hypertension Hyperlipidemia -Statin on hold.  Not on any medications for high blood pressure at home.  Blood pressure  intermittently on the lower side  Hyperthyroidism -Continue methimazole   GERD Continue Pepcid   IBS-C Anxiety -Continue as needed Xanax   Physical deconditioning -PT recommending SNF placement.  TOC following.  DVT prophylaxis: Lovenox  Code Status: DNR Family Communication: Daughter at bedside Disposition Plan: Status is: Inpatient Remains inpatient appropriate because: Of severity of illness  Consultants: None  Procedures: None  Antimicrobials:  Anti-infectives (From admission, onward)    Start     Dose/Rate Route Frequency Ordered Stop   01/12/23 1000  amoxicillin -clavulanate (AUGMENTIN ) 500-125 MG per tablet 1 tablet        1 tablet Oral 2 times daily 01/12/23 0817     01/11/23 1100  amoxicillin -clavulanate (AUGMENTIN ) 875-125 MG per tablet 1 tablet  Status:  Discontinued        1 tablet Oral Every 12 hours 01/11/23 1001 01/12/23 0817   01/11/23 1100  azithromycin  (ZITHROMAX ) tablet 500 mg        500 mg Oral Daily 01/11/23 1001 01/12/23 0947   01/08/23 2200  azithromycin  (ZITHROMAX ) 500 mg in sodium chloride  0.9 % 250 mL IVPB  Status:  Discontinued        500 mg 250 mL/hr over 60 Minutes Intravenous Every 24 hours 01/08/23 0002 01/11/23 1001   01/08/23 2100  cefTRIAXone  (ROCEPHIN ) 1 g in sodium chloride  0.9 % 100 mL IVPB  Status:  Discontinued        1 g 200 mL/hr over 30 Minutes Intravenous Every 24 hours 01/08/23 0002 01/11/23 1001   01/07/23 2130  cefTRIAXone  (ROCEPHIN ) 1 g in sodium chloride  0.9 % 100 mL IVPB        1 g 200 mL/hr over 30 Minutes Intravenous  Once 01/07/23 2118 01/07/23 2215  01/07/23 2130  azithromycin  (ZITHROMAX ) 500 mg in sodium chloride  0.9 % 250 mL IVPB        500 mg 250 mL/hr over 60 Minutes Intravenous  Once 01/07/23 2118 01/07/23 2318        Subjective: Patient seen and examined at bedside.  Continues to have intermittent cough and shortness of breath.  No fever, vomiting, chest pain reported.  Objective: Vitals:   01/11/23 2211  01/12/23 0144 01/12/23 0621 01/12/23 0749  BP: (!) 112/54  105/61   Pulse: 75  68   Resp: 20  17   Temp: 97.7 F (36.5 C)  (!) 97.4 F (36.3 C)   TempSrc: Oral  Oral   SpO2: 95% 98% 98% 94%  Weight:      Height:        Intake/Output Summary (Last 24 hours) at 01/12/2023 1130 Last data filed at 01/11/2023 1700 Gross per 24 hour  Intake 480 ml  Output --  Net 480 ml   Filed Weights   01/07/23 1924  Weight: 72.6 kg    Examination:  General exam: Appears calm and comfortable.  Elderly female sitting on chair.  On 3 L oxygen by nasal cannula. Respiratory system: Bilateral decreased breath sounds at bases with some scattered crackles Cardiovascular system: S1 & S2 heard, Rate controlled Gastrointestinal system: Abdomen is nondistended, soft and nontender. Normal bowel sounds heard. Extremities: No cyanosis, clubbing; trace lower extremity edema present  Central nervous system: Alert and oriented. No focal neurological deficits. Moving extremities Skin: No rashes, lesions or ulcers Psychiatry: Flat affect.  Not agitated.   Data Reviewed: I have personally reviewed following labs and imaging studies  CBC: Recent Labs  Lab 01/07/23 2105 01/08/23 0656 01/09/23 0535  WBC 11.9* 11.4* 12.1*  NEUTROABS 9.3*  --   --   HGB 12.7 11.6* 10.9*  HCT 37.7 34.7* 32.8*  MCV 86.7 88.1 90.6  PLT 342 322 321   Basic Metabolic Panel: Recent Labs  Lab 01/07/23 2105 01/08/23 0656 01/09/23 0535 01/10/23 1228 01/12/23 0549  NA 132* 133* 134* 133* 132*  K 2.8* 3.4* 3.8 3.7 3.4*  CL 98 101 102 99 97*  CO2 21* 22 23 23 23   GLUCOSE 126* 114* 110* 124* 145*  BUN 57* 50* 43* 32* 33*  CREATININE 1.58* 1.41* 1.44* 1.17* 1.43*  CALCIUM  8.5* 8.2* 8.5* 9.2 9.0  MG  --  1.8 1.9  --   --    GFR: Estimated Creatinine Clearance: 25.6 mL/min (A) (by C-G formula based on SCr of 1.43 mg/dL (H)). Liver Function Tests: Recent Labs  Lab 01/07/23 2105 01/08/23 0656 01/10/23 1228 01/12/23 0549   AST 58* 92* 128* 64*  ALT 53* 72* 119* 94*  ALKPHOS 106 101 125 103  BILITOT 0.8 0.8 0.6 0.5  PROT 6.6 5.9* 6.2* 5.9*  ALBUMIN 2.8* 2.4* 2.5* 2.2*   No results for input(s): LIPASE, AMYLASE in the last 168 hours. No results for input(s): AMMONIA in the last 168 hours. Coagulation Profile: No results for input(s): INR, PROTIME in the last 168 hours. Cardiac Enzymes: No results for input(s): CKTOTAL, CKMB, CKMBINDEX, TROPONINI in the last 168 hours. BNP (last 3 results) No results for input(s): PROBNP in the last 8760 hours. HbA1C: No results for input(s): HGBA1C in the last 72 hours. CBG: No results for input(s): GLUCAP in the last 168 hours. Lipid Profile: No results for input(s): CHOL, HDL, LDLCALC, TRIG, CHOLHDL, LDLDIRECT in the last 72 hours. Thyroid  Function Tests: No results for  input(s): TSH, T4TOTAL, FREET4, T3FREE, THYROIDAB in the last 72 hours. Anemia Panel: No results for input(s): VITAMINB12, FOLATE, FERRITIN, TIBC, IRON, RETICCTPCT in the last 72 hours. Sepsis Labs: Recent Labs  Lab 01/08/23 0656  PROCALCITON 2.96    Recent Results (from the past 240 hours)  Resp panel by RT-PCR (RSV, Flu A&B, Covid) Anterior Nasal Swab     Status: None   Collection Time: 01/07/23  8:51 PM   Specimen: Anterior Nasal Swab  Result Value Ref Range Status   SARS Coronavirus 2 by RT PCR NEGATIVE NEGATIVE Final    Comment: (NOTE) SARS-CoV-2 target nucleic acids are NOT DETECTED.  The SARS-CoV-2 RNA is generally detectable in upper respiratory specimens during the acute phase of infection. The lowest concentration of SARS-CoV-2 viral copies this assay can detect is 138 copies/mL. A negative result does not preclude SARS-Cov-2 infection and should not be used as the sole basis for treatment or other patient management decisions. A negative result may occur with  improper specimen collection/handling, submission of  specimen other than nasopharyngeal swab, presence of viral mutation(s) within the areas targeted by this assay, and inadequate number of viral copies(<138 copies/mL). A negative result must be combined with clinical observations, patient history, and epidemiological information. The expected result is Negative.  Fact Sheet for Patients:  bloggercourse.com  Fact Sheet for Healthcare Providers:  seriousbroker.it  This test is no t yet approved or cleared by the [place]  FDA and  has been authorized for detection and/or diagnosis of SARS-CoV-2 by FDA under an Emergency Use Authorization (EUA). This EUA will remain  in effect (meaning this test can be used) for the duration of the COVID-19 declaration under Section 564(b)(1) of the Act, 21 U.S.C.section 360bbb-3(b)(1), unless the authorization is terminated  or revoked sooner.       Influenza A by PCR NEGATIVE NEGATIVE Final   Influenza B by PCR NEGATIVE NEGATIVE Final    Comment: (NOTE) The Xpert Xpress SARS-CoV-2/FLU/RSV plus assay is intended as an aid in the diagnosis of influenza from Nasopharyngeal swab specimens and should not be used as a sole basis for treatment. Nasal washings and aspirates are unacceptable for Xpert Xpress SARS-CoV-2/FLU/RSV testing.  Fact Sheet for Patients: bloggercourse.com  Fact Sheet for Healthcare Providers: seriousbroker.it  This test is not yet approved or cleared by the [place]  FDA and has been authorized for detection and/or diagnosis of SARS-CoV-2 by FDA under an Emergency Use Authorization (EUA). This EUA will remain in effect (meaning this test can be used) for the duration of the COVID-19 declaration under Section 564(b)(1) of the Act, 21 U.S.C. section 360bbb-3(b)(1), unless the authorization is terminated or revoked.     Resp Syncytial Virus by PCR NEGATIVE NEGATIVE Final     Comment: (NOTE) Fact Sheet for Patients: bloggercourse.com  Fact Sheet for Healthcare Providers: seriousbroker.it  This test is not yet approved or cleared by the [place]  FDA and has been authorized for detection and/or diagnosis of SARS-CoV-2 by FDA under an Emergency Use Authorization (EUA). This EUA will remain in effect (meaning this test can be used) for the duration of the COVID-19 declaration under Section 564(b)(1) of the Act, 21 U.S.C. section 360bbb-3(b)(1), unless the authorization is terminated or revoked.  Performed at [facility], [address] [place], [place] [address]   MRSA Next Gen by PCR, Nasal     Status: None   Collection Time: 01/08/23  1:09 AM   Specimen: Nasal Mucosa; Nasal Swab  Result  Value Ref Range Status   MRSA by PCR Next Gen NOT DETECTED NOT DETECTED Final    Comment: (NOTE) The GeneXpert MRSA Assay (FDA approved for NASAL specimens only), is one component of a comprehensive MRSA colonization surveillance program. It is not intended to diagnose MRSA infection nor to guide or monitor treatment for MRSA infections. Test performance is not FDA approved in patients less than 17 years old. Performed at [facility], [address], [place], [place] [address]   Respiratory (~20 pathogens) panel by PCR     Status: None   Collection Time: 01/08/23  1:09 AM   Specimen: Nasal Mucosa; Respiratory  Result Value Ref Range Status   Adenovirus NOT DETECTED NOT DETECTED Final   Coronavirus 229E NOT DETECTED NOT DETECTED Final    Comment: (NOTE) The Coronavirus on the Respiratory Panel, DOES NOT test for the novel  Coronavirus (2019 nCoV)    Coronavirus HKU1 NOT DETECTED NOT DETECTED Final   Coronavirus NL63 NOT DETECTED NOT DETECTED Final   Coronavirus OC43 NOT DETECTED NOT DETECTED Final   Metapneumovirus NOT DETECTED NOT DETECTED Final   Rhinovirus /  Enterovirus NOT DETECTED NOT DETECTED Final   Influenza A NOT DETECTED NOT DETECTED Final   Influenza B NOT DETECTED NOT DETECTED Final   Parainfluenza Virus 1 NOT DETECTED NOT DETECTED Final   Parainfluenza Virus 2 NOT DETECTED NOT DETECTED Final   Parainfluenza Virus 3 NOT DETECTED NOT DETECTED Final   Parainfluenza Virus 4 NOT DETECTED NOT DETECTED Final   Respiratory Syncytial Virus NOT DETECTED NOT DETECTED Final   Bordetella pertussis NOT DETECTED NOT DETECTED Final   Bordetella Parapertussis NOT DETECTED NOT DETECTED Final   Chlamydophila pneumoniae NOT DETECTED NOT DETECTED Final   Mycoplasma pneumoniae NOT DETECTED NOT DETECTED Final    Comment: Performed at [facility], [address], [place], [place] [address]  Expectorated Sputum Assessment w Gram Stain, Rflx to Resp Cult     Status: None   Collection Time: 01/08/23  9:27 AM   Specimen: Expectorated Sputum  Result Value Ref Range Status   Specimen Description EXPECTORATED SPUTUM  Final   Special Requests NONE  Final   Sputum evaluation   Final    THIS SPECIMEN IS ACCEPTABLE FOR SPUTUM CULTURE Performed at [facility], [address] [place], [place] [address]    Report Status 01/08/2023 FINAL  Final  Culture, Respiratory w Gram Stain     Status: None   Collection Time: 01/08/23  9:27 AM  Result Value Ref Range Status   Specimen Description   Final    EXPECTORATED SPUTUM Performed at [facility], [address] [place], [place] [address]    Special Requests   Final    NONE Reflexed from [id number] Performed at [facility], [address], [place], [place] [address]    Gram Stain   Final    FEW WBC PRESENT, PREDOMINANTLY PMN FEW SQUAMOUS EPITHELIAL CELLS PRESENT FEW GRAM NEGATIVE RODS RARE GRAM POSITIVE COCCI    Culture   Final    MODERATE Normal respiratory flora-no Staph aureus or Pseudomonas seen Performed at [facility], [address], [place], [place] [address]    Report Status 01/10/2023 FINAL  Final         Radiology Studies: DG Chest Port 1V same Day Result Date: 01/11/2023 CLINICAL DATA:  Hypoxia EXAM: PORTABLE CHEST 1 VIEW COMPARISON:  01/07/2023 FINDINGS: Heart and mediastinal contours  within normal limits. Aortic atherosclerosis. Layering bilateral pleural effusions. Worsening bilateral airspace disease, predominately in the lower lobes but also in the right upper lobe. No acute bony abnormality. IMPRESSION: Worsening bilateral airspace disease, right greater than left concerning for pneumonia. Electronically Signed   By: Franky Crease M.D.   On: 01/11/2023 21:03        Scheduled Meds:  amoxicillin -clavulanate  1 tablet Oral BID   home med stored in pyxis  1 each Oral Daily   calcium -vitamin D   1 tablet Oral Daily   cholecalciferol   1,000 Units Oral Daily   dicyclomine   20 mg Oral Daily   enoxaparin  (LOVENOX ) injection  40 mg Subcutaneous Q24H   famotidine   20 mg Oral BID   ferrous sulfate   325 mg Oral Q breakfast   ipratropium  1 spray Nasal QID   ipratropium-albuterol   3 mL Nebulization BID   latanoprost   1 drop Both Eyes QHS   methimazole   5 mg Oral QODAY   predniSONE   40 mg Oral Q breakfast   Continuous Infusions:        Sophie Mao, MD [facility] 01/12/2023, 11:30 AM

## 2023-01-13 DIAGNOSIS — M6259 Muscle wasting and atrophy, not elsewhere classified, multiple sites: Secondary | ICD-10-CM | POA: Diagnosis not present

## 2023-01-13 DIAGNOSIS — J9601 Acute respiratory failure with hypoxia: Secondary | ICD-10-CM | POA: Diagnosis not present

## 2023-01-13 DIAGNOSIS — H8109 Meniere's disease, unspecified ear: Secondary | ICD-10-CM | POA: Diagnosis not present

## 2023-01-13 DIAGNOSIS — M6281 Muscle weakness (generalized): Secondary | ICD-10-CM | POA: Diagnosis not present

## 2023-01-13 DIAGNOSIS — R41841 Cognitive communication deficit: Secondary | ICD-10-CM | POA: Diagnosis not present

## 2023-01-13 DIAGNOSIS — E876 Hypokalemia: Secondary | ICD-10-CM | POA: Diagnosis not present

## 2023-01-13 DIAGNOSIS — B370 Candidal stomatitis: Secondary | ICD-10-CM | POA: Diagnosis not present

## 2023-01-13 DIAGNOSIS — D631 Anemia in chronic kidney disease: Secondary | ICD-10-CM | POA: Diagnosis not present

## 2023-01-13 DIAGNOSIS — R1310 Dysphagia, unspecified: Secondary | ICD-10-CM | POA: Diagnosis not present

## 2023-01-13 DIAGNOSIS — K219 Gastro-esophageal reflux disease without esophagitis: Secondary | ICD-10-CM | POA: Diagnosis not present

## 2023-01-13 DIAGNOSIS — E871 Hypo-osmolality and hyponatremia: Secondary | ICD-10-CM | POA: Diagnosis not present

## 2023-01-13 DIAGNOSIS — J99 Respiratory disorders in diseases classified elsewhere: Secondary | ICD-10-CM | POA: Diagnosis not present

## 2023-01-13 DIAGNOSIS — B001 Herpesviral vesicular dermatitis: Secondary | ICD-10-CM | POA: Diagnosis not present

## 2023-01-13 DIAGNOSIS — Z8701 Personal history of pneumonia (recurrent): Secondary | ICD-10-CM | POA: Diagnosis not present

## 2023-01-13 DIAGNOSIS — M4696 Unspecified inflammatory spondylopathy, lumbar region: Secondary | ICD-10-CM | POA: Diagnosis not present

## 2023-01-13 DIAGNOSIS — F419 Anxiety disorder, unspecified: Secondary | ICD-10-CM | POA: Diagnosis not present

## 2023-01-13 DIAGNOSIS — N1832 Chronic kidney disease, stage 3b: Secondary | ICD-10-CM | POA: Diagnosis not present

## 2023-01-13 DIAGNOSIS — H3530 Unspecified macular degeneration: Secondary | ICD-10-CM | POA: Diagnosis not present

## 2023-01-13 DIAGNOSIS — J189 Pneumonia, unspecified organism: Secondary | ICD-10-CM | POA: Diagnosis not present

## 2023-01-13 DIAGNOSIS — Z7401 Bed confinement status: Secondary | ICD-10-CM | POA: Diagnosis not present

## 2023-01-13 DIAGNOSIS — R262 Difficulty in walking, not elsewhere classified: Secondary | ICD-10-CM | POA: Diagnosis not present

## 2023-01-13 DIAGNOSIS — Z8709 Personal history of other diseases of the respiratory system: Secondary | ICD-10-CM | POA: Diagnosis not present

## 2023-01-13 DIAGNOSIS — E785 Hyperlipidemia, unspecified: Secondary | ICD-10-CM | POA: Diagnosis not present

## 2023-01-13 DIAGNOSIS — R1312 Dysphagia, oropharyngeal phase: Secondary | ICD-10-CM | POA: Diagnosis not present

## 2023-01-13 DIAGNOSIS — E0590 Thyrotoxicosis, unspecified without thyrotoxic crisis or storm: Secondary | ICD-10-CM | POA: Diagnosis not present

## 2023-01-13 DIAGNOSIS — J9600 Acute respiratory failure, unspecified whether with hypoxia or hypercapnia: Secondary | ICD-10-CM | POA: Diagnosis not present

## 2023-01-13 DIAGNOSIS — Z7189 Other specified counseling: Secondary | ICD-10-CM | POA: Diagnosis not present

## 2023-01-13 DIAGNOSIS — I447 Left bundle-branch block, unspecified: Secondary | ICD-10-CM | POA: Diagnosis not present

## 2023-01-13 LAB — BASIC METABOLIC PANEL
Anion gap: 10 (ref 5–15)
BUN: 38 mg/dL — ABNORMAL HIGH (ref 8–23)
CO2: 22 mmol/L (ref 22–32)
Calcium: 9.2 mg/dL (ref 8.9–10.3)
Chloride: 99 mmol/L (ref 98–111)
Creatinine, Ser: 1.33 mg/dL — ABNORMAL HIGH (ref 0.44–1.00)
GFR, Estimated: 39 mL/min — ABNORMAL LOW (ref >60)
Glucose, Bld: 144 mg/dL — ABNORMAL HIGH (ref 70–99)
Potassium: 4.2 mmol/L (ref 3.5–5.1)
Sodium: 131 mmol/L — ABNORMAL LOW (ref 135–145)

## 2023-01-13 LAB — CBC WITH DIFFERENTIAL/PLATELET
Abs Immature Granulocytes: 0.19 10*3/uL — ABNORMAL HIGH (ref 0.00–0.07)
Basophils Absolute: 0 10*3/uL (ref 0.0–0.1)
Basophils Relative: 0 %
Eosinophils Absolute: 0 10*3/uL (ref 0.0–0.5)
Eosinophils Relative: 0 %
HCT: 33.5 % — ABNORMAL LOW (ref 36.0–46.0)
Hemoglobin: 11.1 g/dL — ABNORMAL LOW (ref 12.0–15.0)
Immature Granulocytes: 1 %
Lymphocytes Relative: 6 %
Lymphs Abs: 0.8 10*3/uL (ref 0.7–4.0)
MCH: 29.4 pg (ref 26.0–34.0)
MCHC: 33.1 g/dL (ref 30.0–36.0)
MCV: 88.6 fL (ref 80.0–100.0)
Monocytes Absolute: 0.3 10*3/uL (ref 0.1–1.0)
Monocytes Relative: 2 %
Neutro Abs: 12.2 10*3/uL — ABNORMAL HIGH (ref 1.7–7.7)
Neutrophils Relative %: 91 %
Platelets: 392 10*3/uL (ref 150–400)
RBC: 3.78 MIL/uL — ABNORMAL LOW (ref 3.87–5.11)
RDW: 12.7 % (ref 11.5–15.5)
WBC: 13.5 10*3/uL — ABNORMAL HIGH (ref 4.0–10.5)
nRBC: 0 % (ref 0.0–0.2)

## 2023-01-13 LAB — MAGNESIUM: Magnesium: 1.9 mg/dL (ref 1.7–2.4)

## 2023-01-13 MED ORDER — GUAIFENESIN-DM 100-10 MG/5ML PO SYRP: 5.0000 mL | ORAL_SOLUTION | ORAL | Status: DC | PRN

## 2023-01-13 MED ORDER — GUAIFENESIN-DM 100-10 MG/5ML PO SYRP: 10.0000 mL | ORAL_SOLUTION | ORAL | 0 refills | Status: DC | PRN

## 2023-01-13 MED ORDER — MOMETASONE FURO-FORMOTEROL FUM 100-5 MCG/ACT IN AERO: 2.0000 | INHALATION_SPRAY | Freq: Two times a day (BID) | RESPIRATORY_TRACT | 0 refills | Status: DC

## 2023-01-13 MED ORDER — ALBUTEROL SULFATE (2.5 MG/3ML) 0.083% IN NEBU: 2.5000 mg | INHALATION_SOLUTION | Freq: Four times a day (QID) | RESPIRATORY_TRACT | 12 refills | Status: DC | PRN

## 2023-01-13 MED ORDER — METHIMAZOLE 5 MG PO TABS: 5.0000 mg | ORAL_TABLET | ORAL | Status: DC

## 2023-01-13 MED ORDER — PREDNISONE 20 MG PO TABS: 40.0000 mg | ORAL_TABLET | Freq: Every day | ORAL | 0 refills | Status: AC

## 2023-01-13 MED ORDER — ALBUTEROL SULFATE HFA 108 (90 BASE) MCG/ACT IN AERS: 2.0000 | INHALATION_SPRAY | RESPIRATORY_TRACT | 0 refills | Status: DC | PRN

## 2023-01-13 NOTE — Progress Notes (Signed)
 Pt discharged to [facility]. Report given to [facility] LPN. Pt transported via [facility].

## 2023-01-13 NOTE — TOC Progression Note (Signed)
 Transition of Care [facility]) - Progression Note    Patient Details  Name: Sabrina English MRN: [id number] Date of Birth: 07/02/1933  Transition of [facility] CM/SW Contact  Alfonse JONELLE Rex, RN Phone Number: 01/13/2023, 10:02 AM  Clinical Narrative:   Call to pt's dtr Gennie) to review short term rehab bed offers ( [facility], [facility], [facility], [place], [facility], [facility], [place], [place], [place]), accepted bed offer at [facility]     Expected Discharge Plan: Skilled Nursing Facility Barriers to Discharge: SNF Pending discharge orders, Continued Medical Work up  Expected Discharge Plan and Services   Discharge Planning Services: CM Consult Post Acute Care Choice: Skilled Nursing Facility Living arrangements for the past 2 months: Single Family Home                 DME Arranged: N/A DME Agency: NA       HH Arranged: NA HH Agency: NA         Social Determinants of Health (SDOH) Interventions SDOH Screenings   Food Insecurity: No Food Insecurity (01/08/2023)  Housing: Low Risk  (01/08/2023)  Transportation Needs: No Transportation Needs (01/08/2023)  Utilities: Not At Risk (01/08/2023)  Alcohol Screen: Low Risk  (06/20/2019)  Depression (PHQ2-9): Low Risk  (07/01/2022)  Financial Resource Strain: Low Risk  (06/18/2018)  Physical Activity: Insufficiently Active (06/18/2018)  Social Connections: Moderately Isolated (01/08/2023)  Stress: No Stress Concern Present (06/18/2018)  Tobacco Use: Low Risk  (01/07/2023)    Readmission Risk Interventions    01/12/2023    2:08 PM  Readmission Risk Prevention Plan  Transportation Screening Complete  PCP or Specialist Appt within 5-7 Days Complete  Home Care Screening Complete  Medication Review (RN CM) Referral to Pharmacy

## 2023-01-13 NOTE — Progress Notes (Addendum)
 PROGRESS NOTE    Sabrina English  FMW:[id number] DOB: [birth year] DOA: 01/07/2023 PCP: Perri Ronal PARAS, MD   Brief Narrative:   89 y.o. female with medical history significant of hypertension, hyperlipidemia, CKD stage IIIb, macular degeneration, Mnire's disease, osteoporosis, hyperthyroidism, melanoma, GERD, IBS-C, anxiety presented with worsening shortness of breath and cough.  On presentation, COVID-19/influenza/RSV PCR was negative.  Chest x-ray showed bibasilar infiltrates consistent with multifocal pneumonia.  She was started on IV antibiotics.  PT subsequently recommended SNF placement.  TOC following.  Assessment & Plan:   Acute respiratory failure with hypoxia Multifocal pneumonia -Presented with multifocal pneumonia.  COVID/influenza/RSV PCR negative on presentation.  Respiratory panel PCR negative. -Still requiring 3 L oxygen via nasal cannula.  Repeat chest x-ray on 01/11/2023 showed worsening bilateral airspace disease.  Currently on Augmentin  and Finish 7-day course of therapy.  Completed 5 days of Zithromax .  Patient received 1 dose of IV Lasix  on 01/11/2023 and 01/12/2023.  2D echo showed EF of 65 to 70% with grade 1 diastolic dysfunction.  Continue empiric Solu-Medrol . -Wean off oxygen as able.  Continue nebs.  Add insulin as well.  Elevated LFTs -Mild.  Improving.  Monitor intermittently.  Statin on hold  Hypokalemia -Improved  Hyponatremia -Mild.  Monitor  Leukocytosis -Monitor  Anemia of chronic disease -From chronic illnesses.  Hemoglobin currently stable.  Monitor intermittently  CKD stage IIIb -Creatinine currently stable.  Monitor intermittently.  Left bundle branch block -Seen on EKG and appears new compared to previous EKG in the chart from over 8 years ago.  ACS less likely as patient is not endorsing chest pain.  No further intervention needed.    Hypertension Hyperlipidemia -Statin on hold.  Not on any medications for high blood pressure at home.   Blood pressure intermittently on the lower side  Hyperthyroidism -Continue methimazole   GERD Continue Pepcid   IBS-C Anxiety -Continue as needed Xanax   Physical deconditioning -PT recommending SNF placement.  TOC following.  DVT prophylaxis: Lovenox  Code Status: DNR Family Communication: Daughter at bedside Disposition Plan: Status is: Inpatient Remains inpatient appropriate because: Of severity of illness  Consultants: None  Procedures: None  Antimicrobials:  Anti-infectives (From admission, onward)    Start     Dose/Rate Route Frequency Ordered Stop   01/12/23 1000  amoxicillin -clavulanate (AUGMENTIN ) 500-125 MG per tablet 1 tablet        1 tablet Oral 2 times daily 01/12/23 0817     01/11/23 1100  amoxicillin -clavulanate (AUGMENTIN ) 875-125 MG per tablet 1 tablet  Status:  Discontinued        1 tablet Oral Every 12 hours 01/11/23 1001 01/12/23 0817   01/11/23 1100  azithromycin  (ZITHROMAX ) tablet 500 mg        500 mg Oral Daily 01/11/23 1001 01/12/23 0947   01/08/23 2200  azithromycin  (ZITHROMAX ) 500 mg in sodium chloride  0.9 % 250 mL IVPB  Status:  Discontinued        500 mg 250 mL/hr over 60 Minutes Intravenous Every 24 hours 01/08/23 0002 01/11/23 1001   01/08/23 2100  cefTRIAXone  (ROCEPHIN ) 1 g in sodium chloride  0.9 % 100 mL IVPB  Status:  Discontinued        1 g 200 mL/hr over 30 Minutes Intravenous Every 24 hours 01/08/23 0002 01/11/23 1001   01/07/23 2130  cefTRIAXone  (ROCEPHIN ) 1 g in sodium chloride  0.9 % 100 mL IVPB        1 g 200 mL/hr over 30 Minutes Intravenous  Once 01/07/23 2118 01/07/23 2215  01/07/23 2130  azithromycin  (ZITHROMAX ) 500 mg in sodium chloride  0.9 % 250 mL IVPB        500 mg 250 mL/hr over 60 Minutes Intravenous  Once 01/07/23 2118 01/07/23 2318        Subjective: Patient seen and examined at bedside.  Still has cough with exertional shortness of breath.  No fever, chest pain, vomiting reported.  Objective: Vitals:   01/12/23  0749 01/12/23 1401 01/12/23 2007 01/13/23 0612  BP:  (!) 110/51 110/61 (!) 113/58  Pulse:  73 66 66  Resp:   18 20  Temp:  97.6 F (36.4 C) 98.1 F (36.7 C) (!) 97.5 F (36.4 C)  TempSrc:  Oral Oral Oral  SpO2: 94% 95% 93% 94%  Weight:      Height:        Intake/Output Summary (Last 24 hours) at 01/13/2023 0756 Last data filed at 01/13/2023 0630 Gross per 24 hour  Intake 720 ml  Output 550 ml  Net 170 ml   Filed Weights   01/07/23 1924  Weight: 72.6 kg    Examination:  General: On 3 L oxygen via nasal cannula.  No distress.  Elderly female lying in bed.  Looks chronically ill and deconditioned. ENT/neck: No thyromegaly.  JVD is not elevated  respiratory: Decreased breath sounds at bases bilaterally with some crackles; no wheezing  CVS: S1-S2 heard, rate controlled currently Abdominal: Soft, nontender, slightly distended; no organomegaly, bowel sounds are heard Extremities: Trace lower extremity edema; no cyanosis  CNS: Awake and alert.  No focal neurologic deficit.  Moves extremities Lymph: No obvious lymphadenopathy Skin: No obvious ecchymosis/lesions  psych: Flat affect mostly.  Not agitated currently.   Musculoskeletal: No obvious joint swelling/deformity    Data Reviewed: I have personally reviewed following labs and imaging studies  CBC: Recent Labs  Lab 01/07/23 2105 01/08/23 0656 01/09/23 0535 01/13/23 0556  WBC 11.9* 11.4* 12.1* 13.5*  NEUTROABS 9.3*  --   --  12.2*  HGB 12.7 11.6* 10.9* 11.1*  HCT 37.7 34.7* 32.8* 33.5*  MCV 86.7 88.1 90.6 88.6  PLT 342 322 321 392   Basic Metabolic Panel: Recent Labs  Lab 01/08/23 0656 01/09/23 0535 01/10/23 1228 01/12/23 0549 01/13/23 0556  NA 133* 134* 133* 132* 131*  K 3.4* 3.8 3.7 3.4* 4.2  CL 101 102 99 97* 99  CO2 22 23 23 23 22   GLUCOSE 114* 110* 124* 145* 144*  BUN 50* 43* 32* 33* 38*  CREATININE 1.41* 1.44* 1.17* 1.43* 1.33*  CALCIUM  8.2* 8.5* 9.2 9.0 9.2  MG 1.8 1.9  --   --  1.9    GFR: Estimated Creatinine Clearance: 27.5 mL/min (A) (by C-G formula based on SCr of 1.33 mg/dL (H)). Liver Function Tests: Recent Labs  Lab 01/07/23 2105 01/08/23 0656 01/10/23 1228 01/12/23 0549  AST 58* 92* 128* 64*  ALT 53* 72* 119* 94*  ALKPHOS 106 101 125 103  BILITOT 0.8 0.8 0.6 0.5  PROT 6.6 5.9* 6.2* 5.9*  ALBUMIN 2.8* 2.4* 2.5* 2.2*   No results for input(s): LIPASE, AMYLASE in the last 168 hours. No results for input(s): AMMONIA in the last 168 hours. Coagulation Profile: No results for input(s): INR, PROTIME in the last 168 hours. Cardiac Enzymes: No results for input(s): CKTOTAL, CKMB, CKMBINDEX, TROPONINI in the last 168 hours. BNP (last 3 results) No results for input(s): PROBNP in the last 8760 hours. HbA1C: No results for input(s): HGBA1C in the last 72 hours. CBG: No results  for input(s): GLUCAP in the last 168 hours. Lipid Profile: No results for input(s): CHOL, HDL, LDLCALC, TRIG, CHOLHDL, LDLDIRECT in the last 72 hours. Thyroid  Function Tests: No results for input(s): TSH, T4TOTAL, FREET4, T3FREE, THYROIDAB in the last 72 hours. Anemia Panel: No results for input(s): VITAMINB12, FOLATE, FERRITIN, TIBC, IRON, RETICCTPCT in the last 72 hours. Sepsis Labs: Recent Labs  Lab 01/08/23 0656  PROCALCITON 2.96    Recent Results (from the past 240 hours)  Resp panel by RT-PCR (RSV, Flu A&B, Covid) Anterior Nasal Swab     Status: None   Collection Time: 01/07/23  8:51 PM   Specimen: Anterior Nasal Swab  Result Value Ref Range Status   SARS Coronavirus 2 by RT PCR NEGATIVE NEGATIVE Final    Comment: (NOTE) SARS-CoV-2 target nucleic acids are NOT DETECTED.  The SARS-CoV-2 RNA is generally detectable in upper respiratory specimens during the acute phase of infection. The lowest concentration of SARS-CoV-2 viral copies this assay can detect is 138 copies/mL. A negative result does not preclude  SARS-Cov-2 infection and should not be used as the sole basis for treatment or other patient management decisions. A negative result may occur with  improper specimen collection/handling, submission of specimen other than nasopharyngeal swab, presence of viral mutation(s) within the areas targeted by this assay, and inadequate number of viral copies(<138 copies/mL). A negative result must be combined with clinical observations, patient history, and epidemiological information. The expected result is Negative.  Fact Sheet for Patients:  bloggercourse.com  Fact Sheet for Healthcare Providers:  seriousbroker.it  This test is no t yet approved or cleared by the [place]  FDA and  has been authorized for detection and/or diagnosis of SARS-CoV-2 by FDA under an Emergency Use Authorization (EUA). This EUA will remain  in effect (meaning this test can be used) for the duration of the COVID-19 declaration under Section 564(b)(1) of the Act, 21 U.S.C.section 360bbb-3(b)(1), unless the authorization is terminated  or revoked sooner.       Influenza A by PCR NEGATIVE NEGATIVE Final   Influenza B by PCR NEGATIVE NEGATIVE Final    Comment: (NOTE) The Xpert Xpress SARS-CoV-2/FLU/RSV plus assay is intended as an aid in the diagnosis of influenza from Nasopharyngeal swab specimens and should not be used as a sole basis for treatment. Nasal washings and aspirates are unacceptable for Xpert Xpress SARS-CoV-2/FLU/RSV testing.  Fact Sheet for Patients: bloggercourse.com  Fact Sheet for Healthcare Providers: seriousbroker.it  This test is not yet approved or cleared by the [place]  FDA and has been authorized for detection and/or diagnosis of SARS-CoV-2 by FDA under an Emergency Use Authorization (EUA). This EUA will remain in effect (meaning this test can be used) for the duration of  the COVID-19 declaration under Section 564(b)(1) of the Act, 21 U.S.C. section 360bbb-3(b)(1), unless the authorization is terminated or revoked.     Resp Syncytial Virus by PCR NEGATIVE NEGATIVE Final    Comment: (NOTE) Fact Sheet for Patients: bloggercourse.com  Fact Sheet for Healthcare Providers: seriousbroker.it  This test is not yet approved or cleared by the [place]  FDA and has been authorized for detection and/or diagnosis of SARS-CoV-2 by FDA under an Emergency Use Authorization (EUA). This EUA will remain in effect (meaning this test can be used) for the duration of the COVID-19 declaration under Section 564(b)(1) of the Act, 21 U.S.C. section 360bbb-3(b)(1), unless the authorization is terminated or revoked.  Performed at [facility], [address], [place], [place] [address]  MRSA Next Gen by PCR, Nasal     Status: None   Collection Time: 01/08/23  1:09 AM   Specimen: Nasal Mucosa; Nasal Swab  Result Value Ref Range Status   MRSA by PCR Next Gen NOT DETECTED NOT DETECTED Final    Comment: (NOTE) The GeneXpert MRSA Assay (FDA approved for NASAL specimens only), is one component of a comprehensive MRSA colonization surveillance program. It is not intended to diagnose MRSA infection nor to guide or monitor treatment for MRSA infections. Test performance is not FDA approved in patients less than 85 years old. Performed at [facility], [address], [place], [place] [address]   Respiratory (~20 pathogens) panel by PCR     Status: None   Collection Time: 01/08/23  1:09 AM   Specimen: Nasal Mucosa; Respiratory  Result Value Ref Range Status   Adenovirus NOT DETECTED NOT DETECTED Final   Coronavirus 229E NOT DETECTED NOT DETECTED Final    Comment: (NOTE) The Coronavirus on the Respiratory Panel, DOES NOT test for the novel  Coronavirus (2019 nCoV)    Coronavirus HKU1  NOT DETECTED NOT DETECTED Final   Coronavirus NL63 NOT DETECTED NOT DETECTED Final   Coronavirus OC43 NOT DETECTED NOT DETECTED Final   Metapneumovirus NOT DETECTED NOT DETECTED Final   Rhinovirus / Enterovirus NOT DETECTED NOT DETECTED Final   Influenza A NOT DETECTED NOT DETECTED Final   Influenza B NOT DETECTED NOT DETECTED Final   Parainfluenza Virus 1 NOT DETECTED NOT DETECTED Final   Parainfluenza Virus 2 NOT DETECTED NOT DETECTED Final   Parainfluenza Virus 3 NOT DETECTED NOT DETECTED Final   Parainfluenza Virus 4 NOT DETECTED NOT DETECTED Final   Respiratory Syncytial Virus NOT DETECTED NOT DETECTED Final   Bordetella pertussis NOT DETECTED NOT DETECTED Final   Bordetella Parapertussis NOT DETECTED NOT DETECTED Final   Chlamydophila pneumoniae NOT DETECTED NOT DETECTED Final   Mycoplasma pneumoniae NOT DETECTED NOT DETECTED Final    Comment: Performed at [facility], [address], [place], [place] [address]  Expectorated Sputum Assessment w Gram Stain, Rflx to Resp Cult     Status: None   Collection Time: 01/08/23  9:27 AM   Specimen: Expectorated Sputum  Result Value Ref Range Status   Specimen Description EXPECTORATED SPUTUM  Final   Special Requests NONE  Final   Sputum evaluation   Final    THIS SPECIMEN IS ACCEPTABLE FOR SPUTUM CULTURE Performed at [facility], [address] [place], [place] [address]    Report Status 01/08/2023 FINAL  Final  Culture, Respiratory w Gram Stain     Status: None   Collection Time: 01/08/23  9:27 AM  Result Value Ref Range Status   Specimen Description   Final    EXPECTORATED SPUTUM Performed at [facility], [address] [place], [place] [address]    Special Requests   Final    NONE Reflexed from [id number] Performed at [facility], [address], [place], [place] [address]    Gram Stain   Final    FEW WBC PRESENT, PREDOMINANTLY PMN FEW SQUAMOUS EPITHELIAL CELLS  PRESENT FEW GRAM NEGATIVE RODS RARE GRAM POSITIVE COCCI    Culture   Final    MODERATE Normal respiratory flora-no Staph aureus or Pseudomonas seen Performed at [facility], [address], [place], [place] [address]    Report Status 01/10/2023 FINAL  Final  Radiology Studies: ECHOCARDIOGRAM COMPLETE Result Date: 01/12/2023    ECHOCARDIOGRAM REPORT   Patient Name:   RAVYNN HOGATE Ruedas Date of Exam: 01/12/2023 Medical Rec #:  [id number]          Height:       61.5 in Accession #:    [id number]         Weight:       160.0 lb Date of Birth:  November 18, 1933          BSA:          1.728 m Patient Age:    89 years           BP:           105/61 mmHg Patient Gender: F                  HR:           72 bpm. Exam Location:  Inpatient Procedure: 2D Echo, Cardiac Doppler and Color Doppler Indications:    CHF  History:        Patient has no prior history of Echocardiogram examinations.                 Arrythmias:LBBB.  Sonographer:    Lanell Maduro Referring Phys: MILBERT SABAS RAMAN LAMA IMPRESSIONS  1. Left ventricular ejection fraction, by estimation, is 65 to 70%. Left ventricular ejection fraction by 2D MOD biplane is 68.7 %. The left ventricle has normal function. The left ventricle has no regional wall motion abnormalities. Left ventricular diastolic parameters are consistent with Grade I diastolic dysfunction (impaired relaxation). Elevated left ventricular end-diastolic pressure.  2. Right ventricular systolic function is normal. The right ventricular size is normal. There is normal pulmonary artery systolic pressure. The estimated right ventricular systolic pressure is 34.4 mmHg.  3. The mitral valve is abnormal. Trivial mitral valve regurgitation.  4. The aortic valve is tricuspid. Aortic valve regurgitation is trivial. Mild to moderate aortic valve stenosis. Aortic valve area, by VTI measures 1.54 cm. Aortic valve mean gradient measures 19.0 mmHg. Aortic valve Vmax measures 2.86 m/s. Peak  gradient 32.7 mmHg, DI is 0.44, LVOT diameter is 2.1 cm.  5. Aortic dilatation noted. There is borderline dilatation of the ascending aorta, measuring 38 mm.  6. The inferior vena cava is dilated in size with >50% respiratory variability, suggesting right atrial pressure of 8 mmHg. Comparison(s): No prior Echocardiogram. FINDINGS  Left Ventricle: Left ventricular ejection fraction, by estimation, is 65 to 70%. Left ventricular ejection fraction by 2D MOD biplane is 68.7 %. The left ventricle has normal function. The left ventricle has no regional wall motion abnormalities. The left ventricular internal cavity size was normal in size. There is no left ventricular hypertrophy. Left ventricular diastolic parameters are consistent with Grade I diastolic dysfunction (impaired relaxation). Elevated left ventricular end-diastolic pressure. Right Ventricle: The right ventricular size is normal. No increase in right ventricular wall thickness. Right ventricular systolic function is normal. There is normal pulmonary artery systolic pressure. The tricuspid regurgitant velocity is 2.57 m/s, and  with an assumed right atrial pressure of 8 mmHg, the estimated right ventricular systolic pressure is 34.4 mmHg. Left Atrium: Left atrial size was normal in size. Right Atrium: Right atrial size was normal in size. Pericardium: There is no evidence of pericardial effusion. Mitral Valve: The mitral valve is abnormal. There is mild calcification of the anterior and posterior mitral valve leaflet(s). Trivial mitral valve regurgitation. Tricuspid Valve: The tricuspid valve is grossly  normal. Tricuspid valve regurgitation is mild. Aortic Valve: The aortic valve is tricuspid. Aortic valve regurgitation is trivial. Mild to moderate aortic stenosis is present. Aortic valve mean gradient measures 19.0 mmHg. Aortic valve peak gradient measures 32.7 mmHg. Aortic valve area, by VTI measures 1.54 cm. Pulmonic Valve: The pulmonic valve was grossly  normal. Pulmonic valve regurgitation is trivial. Aorta: Aortic dilatation noted. There is borderline dilatation of the ascending aorta, measuring 38 mm. Venous: The inferior vena cava is dilated in size with greater than 50% respiratory variability, suggesting right atrial pressure of 8 mmHg. IAS/Shunts: No atrial level shunt detected by color flow Doppler.  LEFT VENTRICLE PLAX 2D                        Biplane EF (MOD) LVIDd:         4.00 cm         LV Biplane EF:   Left LVIDs:         2.60 cm                          ventricular LV PW:         0.90 cm                          ejection LV IVS:        0.90 cm                          fraction by LVOT diam:     2.10 cm                          2D MOD LV SV:         92                               biplane is LV SV Index:   53                               68.7 %. LVOT Area:     3.46 cm                                Diastology                                LV e' medial:    6.20 cm/s LV Volumes (MOD)               LV E/e' medial:  16.9 LV vol d, MOD    86.6 ml       LV e' lateral:   6.09 cm/s A2C:                           LV E/e' lateral: 17.2 LV vol d, MOD    92.6 ml A4C: LV vol s, MOD    27.0 ml A2C: LV vol s, MOD    28.5 ml A4C: LV SV MOD A2C:   59.6 ml LV SV MOD A4C:   92.6 ml LV SV MOD BP:  62.2 ml RIGHT VENTRICLE             IVC RV Basal diam:  3.10 cm     IVC diam: 2.20 cm RV S prime:     15.50 cm/s TAPSE (M-mode): 2.6 cm LEFT ATRIUM             Index        RIGHT ATRIUM           Index LA diam:        2.80 cm 1.62 cm/m   RA Area:     13.90 cm LA Vol (A2C):   20.1 ml 11.63 ml/m  RA Volume:   29.90 ml  17.30 ml/m LA Vol (A4C):   22.4 ml 12.96 ml/m LA Biplane Vol: 22.2 ml 12.84 ml/m  AORTIC VALVE                     PULMONIC VALVE AV Area (Vmax):    1.56 cm      PR End Diast Vel: 1.09 msec AV Area (Vmean):   1.47 cm AV Area (VTI):     1.54 cm AV Vmax:           286.00 cm/s AV Vmean:          207.000 cm/s AV VTI:            0.597 m AV Peak Grad:       32.7 mmHg AV Mean Grad:      19.0 mmHg LVOT Vmax:         129.00 cm/s LVOT Vmean:        87.900 cm/s LVOT VTI:          0.265 m LVOT/AV VTI ratio: 0.44  AORTA Ao Root diam: 2.50 cm Ao Asc diam:  3.80 cm MITRAL VALVE                TRICUSPID VALVE MV Area (PHT): 3.53 cm     TR Peak grad:   26.4 mmHg MV Decel Time: 215 msec     TR Vmax:        257.00 cm/s MV E velocity: 105.00 cm/s MV A velocity: 129.00 cm/s  SHUNTS MV E/A ratio:  0.81         Systemic VTI:  0.26 m                             Systemic Diam: 2.10 cm Vinie Maxcy MD Electronically signed by Vinie Maxcy MD Signature Date/Time: 01/12/2023/2:44:38 PM    Final    DG Chest Port 1V same Day Result Date: 01/11/2023 CLINICAL DATA:  Hypoxia EXAM: PORTABLE CHEST 1 VIEW COMPARISON:  01/07/2023 FINDINGS: Heart and mediastinal contours within normal limits. Aortic atherosclerosis. Layering bilateral pleural effusions. Worsening bilateral airspace disease, predominately in the lower lobes but also in the right upper lobe. No acute bony abnormality. IMPRESSION: Worsening bilateral airspace disease, right greater than left concerning for pneumonia. Electronically Signed   By: Franky Crease M.D.   On: 01/11/2023 21:03        Scheduled Meds:  amoxicillin -clavulanate  1 tablet Oral BID   home med stored in pyxis  1 each Oral Daily   calcium -vitamin D   1 tablet Oral Daily   cholecalciferol   1,000 Units Oral Daily   dicyclomine   20 mg Oral Daily   enoxaparin  (LOVENOX ) injection  30 mg Subcutaneous Q24H   famotidine   20 mg  Oral BID   ferrous sulfate   325 mg Oral Q breakfast   ipratropium  1 spray Nasal QID   ipratropium-albuterol   3 mL Nebulization BID   latanoprost   1 drop Both Eyes QHS   methimazole   5 mg Oral QODAY   methylPREDNISolone  (SOLU-MEDROL ) injection  80 mg Intravenous Daily   mometasone -formoterol   2 puff Inhalation BID   Continuous Infusions:        Sophie Mao, MD [facility] 01/13/2023, 7:56 AM

## 2023-01-13 NOTE — Progress Notes (Signed)
 Physical Therapy Treatment Patient Details Name: JANAY CANAN MRN: [id number] DOB: 13-Mar-1933 Today's Date: 01/13/2023   History of Present Illness 89 y.o. female presented to hospital with shortness of breath and cough with pulse ox of 87%, admitted with Acute hypoxemic respiratory failure secondary to multifocal pneumonia PMH: hypertension, hyperlipidemia, CKD stage IIIb, macular degeneration, Mnire's disease, osteoporosis, hyperthyroidism, melanoma, GERD, IBS-C, anxiety    PT Comments   Pt admitted with above diagnosis.  Pt currently with functional limitations due to the deficits listed below (see PT Problem List). Pt in bed when PT arrived. Daughter presents. Pt agreeable to therapy intervention. Pt is S for supine to sit with min cues and use of hospital bed, pt required S and min cues for transfer tasks at RW level. Gait with CGA and cues 40 and 15 x 2 feet with RW. Pt remained on 3 L/min supplemental O2. Pt desaturated with therapeutic activity and requires cues for coordinated breathing strategies and decreased respiration rate. Pt left seated in recliner, all needs in place. Pt anticipates d/c to SNF today.  Pt will benefit from acute skilled PT to increase their independence and safety with mobility to allow discharge.      If plan is discharge home, recommend the following: A little help with walking and/or transfers;A little help with bathing/dressing/bathroom;Assistance with cooking/housework;Assist for transportation;Help with stairs or ramp for entrance   Can travel by private vehicle     Yes  Equipment Recommendations  Other (comment) (defer to next venue)    Recommendations for Other Services       Precautions / Restrictions Precautions Precautions: Fall Precaution Comments: monitor O2 Restrictions Weight Bearing Restrictions Per Provider Order: No     Mobility  Bed Mobility Overal bed mobility: Needs Assistance Bed Mobility: Supine to Sit     Supine to  sit: Supervision, HOB elevated, Used rails     General bed mobility comments: extended time/increased effort, no physical assist given    Transfers Overall transfer level: Needs assistance Equipment used: Rolling walker (2 wheels) Transfers: Sit to/from Stand Sit to Stand: Supervision           General transfer comment: assist for O2 line management, no physical assist given for bed, recliner and commode transfers    Ambulation/Gait Ambulation/Gait assistance: Contact guard assist Gait Distance (Feet): 40 Feet Assistive device: Rolling walker (2 wheels) Gait Pattern/deviations: Step-through pattern, Trunk flexed, Narrow base of support, Decreased step length - right, Decreased step length - left Gait velocity: decreased     General Gait Details: cues for safety, direction, posture, proper distance from RW and UE and AD placement with approach to sitting surfaces as well as [place] tube management, cues provided for coordinated breathing. pt limited due to SOB and O2 saturation following 40 foot amb bout decreased to 85% on 3L/min and required 1:17 with cues for pursed lip breathing to recover to 90%, pt maintained O2 saturation 88-90% on 3 L/min with gait tasks bed to commode and commode to recliner 2 x 15 feet   Stairs             Wheelchair Mobility     Tilt Bed    Modified Rankin (Stroke Patients Only)       Balance Overall balance assessment: Needs assistance Sitting-balance support: Feet supported, No upper extremity supported Sitting balance-Leahy Scale: Good Sitting balance - Comments: seated EOB   Standing balance support: Reliant on assistive device for balance, During functional activity Standing balance-Leahy Scale: Poor  Cognition Arousal: Alert Behavior During Therapy: WFL for tasks assessed/performed Overall Cognitive Status: Within Functional Limits for tasks assessed                                           Exercises      General Comments General comments (skin integrity, edema, etc.): 3 L/min supplemental O2 throughout intervention      Pertinent Vitals/Pain      Home Living                          Prior Function            PT Goals (current goals can now be found in the care plan section) Acute Rehab PT Goals PT Goal Formulation: With patient/family Time For Goal Achievement: 01/22/23 Potential to Achieve Goals: Good Progress towards PT goals: Progressing toward goals    Frequency    Min 1X/week      PT Plan      Co-evaluation              AM-PAC PT 6 Clicks Mobility   Outcome Measure  Help needed turning from your back to your side while in a flat bed without using bedrails?: None Help needed moving from lying on your back to sitting on the side of a flat bed without using bedrails?: A Little Help needed moving to and from a bed to a chair (including a wheelchair)?: A Little Help needed standing up from a chair using your arms (e.g., wheelchair or bedside chair)?: A Little Help needed to walk in hospital room?: A Little Help needed climbing 3-5 steps with a railing? : A Lot 6 Click Score: 18    End of Session Equipment Utilized During Treatment: Gait belt;Oxygen Activity Tolerance: Patient tolerated treatment well;Patient limited by fatigue Patient left: in chair;with call bell/phone within reach;with family/visitor present Nurse Communication: Mobility status PT Visit Diagnosis: Other abnormalities of gait and mobility (R26.89);Difficulty in walking, not elsewhere classified (R26.2)     Time: 8984-8955 PT Time Calculation (min) (ACUTE ONLY): 29 min  Charges:    $Gait Training: 8-22 mins $Therapeutic Activity: 8-22 mins PT General Charges $$ ACUTE PT VISIT: 1 Visit                     [place], PT Acute Rehab    [place] VEAR Drone 01/13/2023, 2:11 PM

## 2023-01-13 NOTE — Discharge Summary (Signed)
 Physician Discharge Summary  Sabrina English FMW:[id number] DOB: 04-12-1933 DOA: 01/07/2023  PCP: Perri Ronal PARAS, MD  Admit date: 01/07/2023 Discharge date: 01/13/2023  Admitted From: Home Disposition: SNF  Recommendations for Outpatient Follow-up:  Follow up with SNF provider at earliest convenience Follow up in ED if symptoms worsen or new appear   Home Health: No Equipment/Devices: None  Discharge Condition: Stable CODE STATUS: DNR Diet recommendation: Heart healthy  Brief/Interim Summary:  89 y.o. female with medical history significant of hypertension, hyperlipidemia, CKD stage IIIb, macular degeneration, Mnire's disease, osteoporosis, hyperthyroidism, melanoma, GERD, IBS-C, anxiety presented with worsening shortness of breath and cough.  On presentation, COVID-19/influenza/RSV PCR was negative.  Chest x-ray showed bibasilar infiltrates consistent with multifocal pneumonia.  She was started on IV antibiotics.  PT subsequently recommended SNF placement.  TOC during the hospitalization, her condition has stabilized.  She is still requiring supplemental oxygen which needs to be continued on discharge.  Today is day #7 of antibiotics and does not need any more antibiotics.  She will be discharged today to SNF on 5 more days of oral prednisone .   Discharge Diagnoses:   Acute respiratory failure with hypoxia Multifocal pneumonia -Presented with multifocal pneumonia.  COVID/influenza/RSV PCR negative on presentation.  Respiratory panel PCR negative. -Still requiring 3 L oxygen via nasal cannula.  Repeat chest x-ray on 01/11/2023 showed worsening bilateral airspace disease.  Currently on Augmentin ; today is day #7 of antibiotic therapy.  No need for further antibiotics.  Completed 5 days of Zithromax .  Patient received 1 dose of IV Lasix  on 01/11/2023 and 01/12/2023.  Did not diurese much.  2D echo showed EF of 65 to 70% with grade 1 diastolic dysfunction.  Currently on empiric  Solu-Medrol . -Will continue to require supplemental oxygen on discharge.  Currently on nebs and Dulera  has been added as well.   -Discharge patient to SNF today on oral prednisone  for 5 more days along with Dulera  and as needed nebs/inhalers.  Elevated LFTs -Mild.  Improving.  Monitor intermittently as an outpatient.  Statin on hold   Hypokalemia -Improved   Hyponatremia -Mild.  Monitor as an outpatient   Leukocytosis -Monitor as an outpatient   Anemia of chronic disease -From chronic illnesses.  Hemoglobin currently stable.  Monitor intermittently as an outpatient   CKD stage IIIb -Creatinine currently stable.  Monitor intermittently as an outpatient.   Left bundle branch block -Seen on EKG and appears new compared to previous EKG in the chart from over 8 years ago.  ACS less likely as patient is not endorsing chest pain.  No further intervention needed.     Hypertension Hyperlipidemia -Statin on hold.  Resume home antihypertensive regimen.  Blood pressure intermittently on the lower side   Hyperthyroidism -Continue methimazole    GERD Continue Pepcid    IBS-C Anxiety -Continue as needed Xanax    Physical deconditioning -PT recommending SNF placement.      Discharge Instructions  Discharge Instructions     Diet - low sodium heart healthy   Complete by: As directed    Increase activity slowly   Complete by: As directed       Allergies as of 01/13/2023       Reactions   Levaquin  [levofloxacin  In D5w] Other (See Comments)   Lower extremity pain         Medication List     STOP taking these medications    ALPRAZolam  0.25 MG tablet Commonly known as: XANAX    simvastatin  20 MG tablet Commonly known  as: ZOCOR        TAKE these medications    albuterol  (2.5 MG/3ML) 0.083% nebulizer solution Commonly known as: PROVENTIL  Take 3 mLs (2.5 mg total) by nebulization every 6 (six) hours as needed (with Mucomyst ).   albuterol  108 (90 Base) MCG/ACT  inhaler Commonly known as: VENTOLIN  HFA Inhale 2 puffs into the lungs every 4 (four) hours as needed for wheezing or shortness of breath.   calcium -vitamin D  500-200 MG-UNIT tablet Commonly known as: OSCAL WITH D Take 1 tablet by mouth daily.   cholecalciferol  25 MCG (1000 UNIT) tablet Commonly known as: VITAMIN D3 Take 1,000 Units by mouth daily.   cyclobenzaprine  10 MG tablet Commonly known as: FLEXERIL  Take 1 tablet (10 mg total) by mouth at bedtime. What changed:  when to take this reasons to take this   dicyclomine  20 MG tablet Commonly known as: BENTYL  Take 1 tablet (20 mg total) by mouth daily.   famotidine  20 MG tablet Commonly known as: PEPCID  Take 1 tablet (20 mg total) by mouth 2 (two) times daily.   ferrous sulfate  325 (65 FE) MG EC tablet Take 325 mg by mouth daily with breakfast.   guaiFENesin -dextromethorphan  100-10 MG/5ML syrup Commonly known as: ROBITUSSIN DM Take 10 mLs by mouth every 4 (four) hours as needed for cough.   ipratropium 0.06 % nasal spray Commonly known as: ATROVENT  USE 2 SPRAYS IN BOTH NOSTRILS  EVERY 6 HOURS IF NEEDED TO DRY  UP NOSE What changed:  how much to take how to take this when to take this additional instructions   latanoprost  0.005 % ophthalmic solution Commonly known as: XALATAN  Place 1 drop into both eyes at bedtime.   LIPOFLAVONOID PO Take 1 tablet by mouth 2 (two) times daily.   meclizine  25 MG tablet Commonly known as: ANTIVERT  Take 1 tablet (25 mg total) by mouth 3 (three) times daily as needed. What changed: reasons to take this   methimazole  5 MG tablet Commonly known as: TAPAZOLE  Take 1 tablet (5 mg total) by mouth every other day.   mometasone -formoterol  100-5 MCG/ACT Aero Commonly known as: DULERA  Inhale 2 puffs into the lungs 2 (two) times daily.   predniSONE  20 MG tablet Commonly known as: DELTASONE  Take 2 tablets (40 mg total) by mouth daily with breakfast for 5 days. Start taking on: January 14, 2023   PRESCRIPTION MEDICATION Take 8 mg by mouth daily. Betahistine Dihydrochloride 8 mg   PRESCRIPTION MEDICATION Place 1 spray into the nose daily. Unknown nasal spray   PRESERVISION AREDS PO Take 1 tablet by mouth in the morning and at bedtime.   triamterene -hydrochlorothiazide 37.5-25 MG capsule Commonly known as: DYAZIDE TAKE 1 CAPSULE BY MOUTH EVERY DAY        Contact information for after-discharge care     Destination     [facility] Preferred SNF .   Service: Skilled Nursing Contact information: [address] [place]  [address] [phone]                    Allergies  Allergen Reactions   Levaquin  [Levofloxacin  In D5w] Other (See Comments)    Lower extremity pain     Consultations: None   Procedures/Studies: ECHOCARDIOGRAM COMPLETE Result Date: 01/12/2023    ECHOCARDIOGRAM REPORT   Patient Name:   Sabrina English Date of Exam: 01/12/2023 Medical Rec #:  [id number]          Height:       61.5  in Accession #:    [id number]         Weight:       160.0 lb Date of Birth:  1933/07/31          BSA:          1.728 m Patient Age:    89 years           BP:           105/61 mmHg Patient Gender: F                  HR:           72 bpm. Exam Location:  Inpatient Procedure: 2D Echo, Cardiac Doppler and Color Doppler Indications:    CHF  History:        Patient has no prior history of Echocardiogram examinations.                 Arrythmias:LBBB.  Sonographer:    Lanell Maduro Referring Phys: MILBERT SABAS RAMAN LAMA IMPRESSIONS  1. Left ventricular ejection fraction, by estimation, is 65 to 70%. Left ventricular ejection fraction by 2D MOD biplane is 68.7 %. The left ventricle has normal function. The left ventricle has no regional wall motion abnormalities. Left ventricular diastolic parameters are consistent with Grade I diastolic dysfunction (impaired relaxation). Elevated left ventricular end-diastolic pressure.  2.  Right ventricular systolic function is normal. The right ventricular size is normal. There is normal pulmonary artery systolic pressure. The estimated right ventricular systolic pressure is 34.4 mmHg.  3. The mitral valve is abnormal. Trivial mitral valve regurgitation.  4. The aortic valve is tricuspid. Aortic valve regurgitation is trivial. Mild to moderate aortic valve stenosis. Aortic valve area, by VTI measures 1.54 cm. Aortic valve mean gradient measures 19.0 mmHg. Aortic valve Vmax measures 2.86 m/s. Peak gradient 32.7 mmHg, DI is 0.44, LVOT diameter is 2.1 cm.  5. Aortic dilatation noted. There is borderline dilatation of the ascending aorta, measuring 38 mm.  6. The inferior vena cava is dilated in size with >50% respiratory variability, suggesting right atrial pressure of 8 mmHg. Comparison(s): No prior Echocardiogram. FINDINGS  Left Ventricle: Left ventricular ejection fraction, by estimation, is 65 to 70%. Left ventricular ejection fraction by 2D MOD biplane is 68.7 %. The left ventricle has normal function. The left ventricle has no regional wall motion abnormalities. The left ventricular internal cavity size was normal in size. There is no left ventricular hypertrophy. Left ventricular diastolic parameters are consistent with Grade I diastolic dysfunction (impaired relaxation). Elevated left ventricular end-diastolic pressure. Right Ventricle: The right ventricular size is normal. No increase in right ventricular wall thickness. Right ventricular systolic function is normal. There is normal pulmonary artery systolic pressure. The tricuspid regurgitant velocity is 2.57 m/s, and  with an assumed right atrial pressure of 8 mmHg, the estimated right ventricular systolic pressure is 34.4 mmHg. Left Atrium: Left atrial size was normal in size. Right Atrium: Right atrial size was normal in size. Pericardium: There is no evidence of pericardial effusion. Mitral Valve: The mitral valve is abnormal. There is  mild calcification of the anterior and posterior mitral valve leaflet(s). Trivial mitral valve regurgitation. Tricuspid Valve: The tricuspid valve is grossly normal. Tricuspid valve regurgitation is mild. Aortic Valve: The aortic valve is tricuspid. Aortic valve regurgitation is trivial. Mild to moderate aortic stenosis is present. Aortic valve mean gradient measures 19.0 mmHg. Aortic valve peak gradient measures 32.7 mmHg. Aortic valve area, by VTI measures 1.54  cm. Pulmonic Valve: The pulmonic valve was grossly normal. Pulmonic valve regurgitation is trivial. Aorta: Aortic dilatation noted. There is borderline dilatation of the ascending aorta, measuring 38 mm. Venous: The inferior vena cava is dilated in size with greater than 50% respiratory variability, suggesting right atrial pressure of 8 mmHg. IAS/Shunts: No atrial level shunt detected by color flow Doppler.  LEFT VENTRICLE PLAX 2D                        Biplane EF (MOD) LVIDd:         4.00 cm         LV Biplane EF:   Left LVIDs:         2.60 cm                          ventricular LV PW:         0.90 cm                          ejection LV IVS:        0.90 cm                          fraction by LVOT diam:     2.10 cm                          2D MOD LV SV:         92                               biplane is LV SV Index:   53                               68.7 %. LVOT Area:     3.46 cm                                Diastology                                LV e' medial:    6.20 cm/s LV Volumes (MOD)               LV E/e' medial:  16.9 LV vol d, MOD    86.6 ml       LV e' lateral:   6.09 cm/s A2C:                           LV E/e' lateral: 17.2 LV vol d, MOD    92.6 ml A4C: LV vol s, MOD    27.0 ml A2C: LV vol s, MOD    28.5 ml A4C: LV SV MOD A2C:   59.6 ml LV SV MOD A4C:   92.6 ml LV SV MOD BP:    62.2 ml RIGHT VENTRICLE             IVC RV Basal diam:  3.10 cm     IVC diam: 2.20 cm RV S prime:     15.50 cm/s TAPSE (M-mode): 2.6 cm LEFT ATRIUM  Index        RIGHT ATRIUM           Index LA diam:        2.80 cm 1.62 cm/m   RA Area:     13.90 cm LA Vol (A2C):   20.1 ml 11.63 ml/m  RA Volume:   29.90 ml  17.30 ml/m LA Vol (A4C):   22.4 ml 12.96 ml/m LA Biplane Vol: 22.2 ml 12.84 ml/m  AORTIC VALVE                     PULMONIC VALVE AV Area (Vmax):    1.56 cm      PR End Diast Vel: 1.09 msec AV Area (Vmean):   1.47 cm AV Area (VTI):     1.54 cm AV Vmax:           286.00 cm/s AV Vmean:          207.000 cm/s AV VTI:            0.597 m AV Peak Grad:      32.7 mmHg AV Mean Grad:      19.0 mmHg LVOT Vmax:         129.00 cm/s LVOT Vmean:        87.900 cm/s LVOT VTI:          0.265 m LVOT/AV VTI ratio: 0.44  AORTA Ao Root diam: 2.50 cm Ao Asc diam:  3.80 cm MITRAL VALVE                TRICUSPID VALVE MV Area (PHT): 3.53 cm     TR Peak grad:   26.4 mmHg MV Decel Time: 215 msec     TR Vmax:        257.00 cm/s MV E velocity: 105.00 cm/s MV A velocity: 129.00 cm/s  SHUNTS MV E/A ratio:  0.81         Systemic VTI:  0.26 m                             Systemic Diam: 2.10 cm Vinie Maxcy MD Electronically signed by Vinie Maxcy MD Signature Date/Time: 01/12/2023/2:44:38 PM    Final    DG Chest Port 1V same Day Result Date: 01/11/2023 CLINICAL DATA:  Hypoxia EXAM: PORTABLE CHEST 1 VIEW COMPARISON:  01/07/2023 FINDINGS: Heart and mediastinal contours within normal limits. Aortic atherosclerosis. Layering bilateral pleural effusions. Worsening bilateral airspace disease, predominately in the lower lobes but also in the right upper lobe. No acute bony abnormality. IMPRESSION: Worsening bilateral airspace disease, right greater than left concerning for pneumonia. Electronically Signed   By: Franky Crease M.D.   On: 01/11/2023 21:03   DG Chest 2 View Result Date: 01/07/2023 CLINICAL DATA:  Shortness of breath for several days, initial encounter EXAM: CHEST - 2 VIEW COMPARISON:  06/08/2015 FINDINGS: Cardiac shadow is within normal limits. Tortuous thoracic aorta is  noted with calcification. Lungs are well aerated bilaterally. Patchy bibasilar airspace opacity is noted right greater than left consistent with multifocal pneumonia. Postsurgical changes in the left axilla are seen. IMPRESSION: Bibasilar infiltrates right greater than left. Electronically Signed   By: Oneil Devonshire M.D.   On: 01/07/2023 19:48      Subjective: Patient seen and examined at bedside.  Still has cough with exertional shortness of breath.  No fever, chest pain, vomiting reported.    Discharge Exam: Vitals:   01/13/23  0612 01/13/23 0921  BP: (!) 113/58   Pulse: 66   Resp: 20   Temp: (!) 97.5 F (36.4 C)   SpO2: 94% 90%    General: On 3 L oxygen via nasal cannula.  No distress.  Elderly female lying in bed.  Looks chronically ill and deconditioned. ENT/neck: No thyromegaly.  JVD is not elevated  respiratory: Decreased breath sounds at bases bilaterally with some crackles; no wheezing  CVS: S1-S2 heard, rate controlled currently Abdominal: Soft, nontender, slightly distended; no organomegaly, bowel sounds are heard Extremities: Trace lower extremity edema; no cyanosis  CNS: Awake and alert.  No focal neurologic deficit.  Moves extremities Lymph: No obvious lymphadenopathy Skin: No obvious ecchymosis/lesions  psych: Flat affect mostly.  Not agitated currently.   Musculoskeletal: No obvious joint swelling/deformity    The results of significant diagnostics from this hospitalization (including imaging, microbiology, ancillary and laboratory) are listed below for reference.     Microbiology: Recent Results (from the past 240 hours)  Resp panel by RT-PCR (RSV, Flu A&B, Covid) Anterior Nasal Swab     Status: None   Collection Time: 01/07/23  8:51 PM   Specimen: Anterior Nasal Swab  Result Value Ref Range Status   SARS Coronavirus 2 by RT PCR NEGATIVE NEGATIVE Final    Comment: (NOTE) SARS-CoV-2 target nucleic acids are NOT DETECTED.  The SARS-CoV-2 RNA is generally  detectable in upper respiratory specimens during the acute phase of infection. The lowest concentration of SARS-CoV-2 viral copies this assay can detect is 138 copies/mL. A negative result does not preclude SARS-Cov-2 infection and should not be used as the sole basis for treatment or other patient management decisions. A negative result may occur with  improper specimen collection/handling, submission of specimen other than nasopharyngeal swab, presence of viral mutation(s) within the areas targeted by this assay, and inadequate number of viral copies(<138 copies/mL). A negative result must be combined with clinical observations, patient history, and epidemiological information. The expected result is Negative.  Fact Sheet for Patients:  bloggercourse.com  Fact Sheet for Healthcare Providers:  seriousbroker.it  This test is no t yet approved or cleared by the [place]  FDA and  has been authorized for detection and/or diagnosis of SARS-CoV-2 by FDA under an Emergency Use Authorization (EUA). This EUA will remain  in effect (meaning this test can be used) for the duration of the COVID-19 declaration under Section 564(b)(1) of the Act, 21 U.S.C.section 360bbb-3(b)(1), unless the authorization is terminated  or revoked sooner.       Influenza A by PCR NEGATIVE NEGATIVE Final   Influenza B by PCR NEGATIVE NEGATIVE Final    Comment: (NOTE) The Xpert Xpress SARS-CoV-2/FLU/RSV plus assay is intended as an aid in the diagnosis of influenza from Nasopharyngeal swab specimens and should not be used as a sole basis for treatment. Nasal washings and aspirates are unacceptable for Xpert Xpress SARS-CoV-2/FLU/RSV testing.  Fact Sheet for Patients: bloggercourse.com  Fact Sheet for Healthcare Providers: seriousbroker.it  This test is not yet approved or cleared by the [place]  FDA  and has been authorized for detection and/or diagnosis of SARS-CoV-2 by FDA under an Emergency Use Authorization (EUA). This EUA will remain in effect (meaning this test can be used) for the duration of the COVID-19 declaration under Section 564(b)(1) of the Act, 21 U.S.C. section 360bbb-3(b)(1), unless the authorization is terminated or revoked.     Resp Syncytial Virus by PCR NEGATIVE NEGATIVE Final    Comment: (NOTE) Fact Sheet for  Patients: bloggercourse.com  Fact Sheet for Healthcare Providers: seriousbroker.it  This test is not yet approved or cleared by the [place]  FDA and has been authorized for detection and/or diagnosis of SARS-CoV-2 by FDA under an Emergency Use Authorization (EUA). This EUA will remain in effect (meaning this test can be used) for the duration of the COVID-19 declaration under Section 564(b)(1) of the Act, 21 U.S.C. section 360bbb-3(b)(1), unless the authorization is terminated or revoked.  Performed at [facility], [address], [place], [place] [address]   MRSA Next Gen by PCR, Nasal     Status: None   Collection Time: 01/08/23  1:09 AM   Specimen: Nasal Mucosa; Nasal Swab  Result Value Ref Range Status   MRSA by PCR Next Gen NOT DETECTED NOT DETECTED Final    Comment: (NOTE) The GeneXpert MRSA Assay (FDA approved for NASAL specimens only), is one component of a comprehensive MRSA colonization surveillance program. It is not intended to diagnose MRSA infection nor to guide or monitor treatment for MRSA infections. Test performance is not FDA approved in patients less than 25 years old. Performed at [facility], [address], [place], [place] [address]   Respiratory (~20 pathogens) panel by PCR     Status: None   Collection Time: 01/08/23  1:09 AM   Specimen: Nasal Mucosa; Respiratory  Result Value Ref Range Status   Adenovirus NOT DETECTED NOT  DETECTED Final   Coronavirus 229E NOT DETECTED NOT DETECTED Final    Comment: (NOTE) The Coronavirus on the Respiratory Panel, DOES NOT test for the novel  Coronavirus (2019 nCoV)    Coronavirus HKU1 NOT DETECTED NOT DETECTED Final   Coronavirus NL63 NOT DETECTED NOT DETECTED Final   Coronavirus OC43 NOT DETECTED NOT DETECTED Final   Metapneumovirus NOT DETECTED NOT DETECTED Final   Rhinovirus / Enterovirus NOT DETECTED NOT DETECTED Final   Influenza A NOT DETECTED NOT DETECTED Final   Influenza B NOT DETECTED NOT DETECTED Final   Parainfluenza Virus 1 NOT DETECTED NOT DETECTED Final   Parainfluenza Virus 2 NOT DETECTED NOT DETECTED Final   Parainfluenza Virus 3 NOT DETECTED NOT DETECTED Final   Parainfluenza Virus 4 NOT DETECTED NOT DETECTED Final   Respiratory Syncytial Virus NOT DETECTED NOT DETECTED Final   Bordetella pertussis NOT DETECTED NOT DETECTED Final   Bordetella Parapertussis NOT DETECTED NOT DETECTED Final   Chlamydophila pneumoniae NOT DETECTED NOT DETECTED Final   Mycoplasma pneumoniae NOT DETECTED NOT DETECTED Final    Comment: Performed at [facility], [address], [place], [place] [address]  Expectorated Sputum Assessment w Gram Stain, Rflx to Resp Cult     Status: None   Collection Time: 01/08/23  9:27 AM   Specimen: Expectorated Sputum  Result Value Ref Range Status   Specimen Description EXPECTORATED SPUTUM  Final   Special Requests NONE  Final   Sputum evaluation   Final    THIS SPECIMEN IS ACCEPTABLE FOR SPUTUM CULTURE Performed at [facility], [address] [place], [place] [address]    Report Status 01/08/2023 FINAL  Final  Culture, Respiratory w Gram Stain     Status: None   Collection Time: 01/08/23  9:27 AM  Result Value Ref Range Status   Specimen Description   Final    EXPECTORATED SPUTUM Performed at [facility], [address] [place], [place] [address]    Special Requests   Final    NONE  Reflexed from [id number] Performed at [facility], [address], [place], [place] [address]    Gram Stain   Final    FEW WBC PRESENT, PREDOMINANTLY PMN FEW SQUAMOUS EPITHELIAL CELLS PRESENT FEW GRAM NEGATIVE RODS RARE GRAM POSITIVE COCCI    Culture   Final    MODERATE Normal respiratory flora-no Staph aureus or Pseudomonas seen Performed at [facility], [address], [place], [place] [address]    Report Status 01/10/2023 FINAL  Final     Labs: BNP (last 3 results) Recent Labs    01/07/23 2105 01/11/23 1559  BNP 172.9* 166.2*   Basic Metabolic Panel: Recent Labs  Lab 01/08/23 0656 01/09/23 0535 01/10/23 1228 01/12/23 0549 01/13/23 0556  NA 133* 134* 133* 132* 131*  K 3.4* 3.8 3.7 3.4* 4.2  CL 101 102 99 97* 99  CO2 22 23 23 23 22   GLUCOSE 114* 110* 124* 145* 144*  BUN 50* 43* 32* 33* 38*  CREATININE 1.41* 1.44* 1.17* 1.43* 1.33*  CALCIUM  8.2* 8.5* 9.2 9.0 9.2  MG 1.8 1.9  --   --  1.9   Liver Function Tests: Recent Labs  Lab 01/07/23 2105 01/08/23 0656 01/10/23 1228 01/12/23 0549  AST 58* 92* 128* 64*  ALT 53* 72* 119* 94*  ALKPHOS 106 101 125 103  BILITOT 0.8 0.8 0.6 0.5  PROT 6.6 5.9* 6.2* 5.9*  ALBUMIN 2.8* 2.4* 2.5* 2.2*   No results for input(s): LIPASE, AMYLASE in the last 168 hours. No results for input(s): AMMONIA in the last 168 hours. CBC: Recent Labs  Lab 01/07/23 2105 01/08/23 0656 01/09/23 0535 01/13/23 0556  WBC 11.9* 11.4* 12.1* 13.5*  NEUTROABS 9.3*  --   --  12.2*  HGB 12.7 11.6* 10.9* 11.1*  HCT 37.7 34.7* 32.8* 33.5*  MCV 86.7 88.1 90.6 88.6  PLT 342 322 321 392   Cardiac Enzymes: No results for input(s): CKTOTAL, CKMB, CKMBINDEX, TROPONINI in the last 168 hours. BNP: Invalid input(s): POCBNP CBG: No results for input(s): GLUCAP in the last 168 hours. D-Dimer No results for input(s): DDIMER in the last 72 hours. Hgb A1c No results for input(s): HGBA1C in the last 72  hours. Lipid Profile No results for input(s): CHOL, HDL, LDLCALC, TRIG, CHOLHDL, LDLDIRECT in the last 72 hours. Thyroid  function studies No results for input(s): TSH, T4TOTAL, T3FREE, THYROIDAB in the last 72 hours.  Invalid input(s): FREET3 Anemia work up No results for input(s): VITAMINB12, FOLATE, FERRITIN, TIBC, IRON, RETICCTPCT in the last 72 hours. Urinalysis    Component Value Date/Time   BILIRUBINUR neg 07/01/2022 1552   PROTEINUR Negative 07/01/2022 1552   UROBILINOGEN 0.2 07/01/2022 1552   NITRITE neg 07/01/2022 1552   LEUKOCYTESUR Negative 07/01/2022 1552   Sepsis Labs Recent Labs  Lab 01/07/23 2105 01/08/23 0656 01/09/23 0535 01/13/23 0556  WBC 11.9* 11.4* 12.1* 13.5*   Microbiology Recent Results (from the past 240 hours)  Resp panel by RT-PCR (RSV, Flu A&B, Covid) Anterior Nasal Swab     Status: None   Collection Time: 01/07/23  8:51 PM   Specimen: Anterior Nasal Swab  Result Value Ref Range Status   SARS Coronavirus 2 by RT PCR NEGATIVE NEGATIVE Final    Comment: (NOTE) SARS-CoV-2 target nucleic acids are NOT DETECTED.  The SARS-CoV-2 RNA is generally detectable in upper respiratory specimens during the acute phase of infection. The lowest concentration of SARS-CoV-2 viral copies this assay can detect is 138 copies/mL. A negative result does not  preclude SARS-Cov-2 infection and should not be used as the sole basis for treatment or other patient management decisions. A negative result may occur with  improper specimen collection/handling, submission of specimen other than nasopharyngeal swab, presence of viral mutation(s) within the areas targeted by this assay, and inadequate number of viral copies(<138 copies/mL). A negative result must be combined with clinical observations, patient history, and epidemiological information. The expected result is Negative.  Fact Sheet for Patients:   bloggercourse.com  Fact Sheet for Healthcare Providers:  seriousbroker.it  This test is no t yet approved or cleared by the [place]  FDA and  has been authorized for detection and/or diagnosis of SARS-CoV-2 by FDA under an Emergency Use Authorization (EUA). This EUA will remain  in effect (meaning this test can be used) for the duration of the COVID-19 declaration under Section 564(b)(1) of the Act, 21 U.S.C.section 360bbb-3(b)(1), unless the authorization is terminated  or revoked sooner.       Influenza A by PCR NEGATIVE NEGATIVE Final   Influenza B by PCR NEGATIVE NEGATIVE Final    Comment: (NOTE) The Xpert Xpress SARS-CoV-2/FLU/RSV plus assay is intended as an aid in the diagnosis of influenza from Nasopharyngeal swab specimens and should not be used as a sole basis for treatment. Nasal washings and aspirates are unacceptable for Xpert Xpress SARS-CoV-2/FLU/RSV testing.  Fact Sheet for Patients: bloggercourse.com  Fact Sheet for Healthcare Providers: seriousbroker.it  This test is not yet approved or cleared by the [place]  FDA and has been authorized for detection and/or diagnosis of SARS-CoV-2 by FDA under an Emergency Use Authorization (EUA). This EUA will remain in effect (meaning this test can be used) for the duration of the COVID-19 declaration under Section 564(b)(1) of the Act, 21 U.S.C. section 360bbb-3(b)(1), unless the authorization is terminated or revoked.     Resp Syncytial Virus by PCR NEGATIVE NEGATIVE Final    Comment: (NOTE) Fact Sheet for Patients: bloggercourse.com  Fact Sheet for Healthcare Providers: seriousbroker.it  This test is not yet approved or cleared by the [place]  FDA and has been authorized for detection and/or diagnosis of SARS-CoV-2 by FDA under an Emergency Use  Authorization (EUA). This EUA will remain in effect (meaning this test can be used) for the duration of the COVID-19 declaration under Section 564(b)(1) of the Act, 21 U.S.C. section 360bbb-3(b)(1), unless the authorization is terminated or revoked.  Performed at [facility], [address] [place], [place] [address]   MRSA Next Gen by PCR, Nasal     Status: None   Collection Time: 01/08/23  1:09 AM   Specimen: Nasal Mucosa; Nasal Swab  Result Value Ref Range Status   MRSA by PCR Next Gen NOT DETECTED NOT DETECTED Final    Comment: (NOTE) The GeneXpert MRSA Assay (FDA approved for NASAL specimens only), is one component of a comprehensive MRSA colonization surveillance program. It is not intended to diagnose MRSA infection nor to guide or monitor treatment for MRSA infections. Test performance is not FDA approved in patients less than 83 years old. Performed at [facility], [address], [place], [place] [address]   Respiratory (~20 pathogens) panel by PCR     Status: None   Collection Time: 01/08/23  1:09 AM   Specimen: Nasal Mucosa; Respiratory  Result Value Ref Range Status   Adenovirus NOT DETECTED NOT DETECTED Final   Coronavirus 229E NOT DETECTED NOT DETECTED Final    Comment: (NOTE) The Coronavirus on the Respiratory Panel,  DOES NOT test for the novel  Coronavirus (2019 nCoV)    Coronavirus HKU1 NOT DETECTED NOT DETECTED Final   Coronavirus NL63 NOT DETECTED NOT DETECTED Final   Coronavirus OC43 NOT DETECTED NOT DETECTED Final   Metapneumovirus NOT DETECTED NOT DETECTED Final   Rhinovirus / Enterovirus NOT DETECTED NOT DETECTED Final   Influenza A NOT DETECTED NOT DETECTED Final   Influenza B NOT DETECTED NOT DETECTED Final   Parainfluenza Virus 1 NOT DETECTED NOT DETECTED Final   Parainfluenza Virus 2 NOT DETECTED NOT DETECTED Final   Parainfluenza Virus 3 NOT DETECTED NOT DETECTED Final   Parainfluenza Virus 4 NOT DETECTED  NOT DETECTED Final   Respiratory Syncytial Virus NOT DETECTED NOT DETECTED Final   Bordetella pertussis NOT DETECTED NOT DETECTED Final   Bordetella Parapertussis NOT DETECTED NOT DETECTED Final   Chlamydophila pneumoniae NOT DETECTED NOT DETECTED Final   Mycoplasma pneumoniae NOT DETECTED NOT DETECTED Final    Comment: Performed at [facility], [address] [place], [place] [address]  Expectorated Sputum Assessment w Gram Stain, Rflx to Resp Cult     Status: None   Collection Time: 01/08/23  9:27 AM   Specimen: Expectorated Sputum  Result Value Ref Range Status   Specimen Description EXPECTORATED SPUTUM  Final   Special Requests NONE  Final   Sputum evaluation   Final    THIS SPECIMEN IS ACCEPTABLE FOR SPUTUM CULTURE Performed at [facility], [address] [place], [place] [address]    Report Status 01/08/2023 FINAL  Final  Culture, Respiratory w Gram Stain     Status: None   Collection Time: 01/08/23  9:27 AM  Result Value Ref Range Status   Specimen Description   Final    EXPECTORATED SPUTUM Performed at [facility], [address], [place], [place] [address]    Special Requests   Final    NONE Reflexed from [id number] Performed at [facility], [address], [place], [place] [address]    Gram Stain   Final    FEW WBC PRESENT, PREDOMINANTLY PMN FEW SQUAMOUS EPITHELIAL CELLS PRESENT FEW GRAM NEGATIVE RODS RARE GRAM POSITIVE COCCI    Culture   Final    MODERATE Normal respiratory flora-no Staph aureus or Pseudomonas seen Performed at [facility], [address], [place], [place] [address]    Report Status 01/10/2023 FINAL  Final     Time coordinating discharge: 35 minutes  SIGNED:   Sophie Mao, MD  [facility] 01/13/2023, 10:17 AM

## 2023-01-13 NOTE — TOC Transition Note (Signed)
 Transition of Care [facility]) - Discharge Note   Patient Details  Name: Sabrina English MRN: [id number] Date of Birth: 03/31/1933  [facility]) CM/SW Contact:  Alfonse JONELLE Rex, RN Phone Number: 01/13/2023, 11:00 AM   Clinical Narrative:  DC to SNF,  [facility], admissions coordinator, [place],  confirmed bed available today, RM 807p, Call report [phone]. [facility] called for transport. No further TOC needs.       Final next level of care: Skilled Nursing Facility Barriers to Discharge: Barriers Resolved   Patient Goals and CMS Choice Patient states their goals for this hospitalization and ongoing recovery are:: return home CMS Medicare.gov Compare Post Acute Care list provided to:: Patient Represenative (must comment) (Apple,Sharon (Daughter)  [phone] (Mobile)) Choice offered to / list presented to : Adult [facility] [place] ownership interest in [facility].provided to:: Patient    Discharge Placement              Patient chooses bed at: [facility] Patient to be transferred to facility by: [facility] Name of family member notified: Apple,Sharon (Daughter)  [phone] (Mobile) Patient and family notified of of transfer: 01/13/23  Discharge Plan and Services Additional resources added to the After Visit Summary for     Discharge Planning Services: CM Consult Post Acute Care Choice: Skilled Nursing Facility          DME Arranged: N/A DME Agency: NA       HH Arranged: NA HH Agency: NA        Social Drivers of Health (SDOH) Interventions SDOH Screenings   Food Insecurity: No Food Insecurity (01/08/2023)  Housing: Low Risk  (01/08/2023)  Transportation Needs: No Transportation Needs (01/08/2023)  Utilities: Not At Risk (01/08/2023)  Alcohol Screen: Low Risk  (06/20/2019)  Depression (PHQ2-9): Low Risk  (07/01/2022)  Financial Resource Strain: Low Risk  (06/18/2018)  Physical Activity: Insufficiently Active (06/18/2018)  Social Connections:  Moderately Isolated (01/08/2023)  Stress: No Stress Concern Present (06/18/2018)  Tobacco Use: Low Risk  (01/07/2023)     Readmission Risk Interventions    01/12/2023    2:08 PM  Readmission Risk Prevention Plan  Transportation Screening Complete  PCP or Specialist Appt within 5-7 Days Complete  Home Care Screening Complete  Medication Review (RN CM) Referral to Pharmacy

## 2023-01-13 NOTE — Care Management Important Message (Signed)
 Important Message  Patient Details IM Letter given. Name: Sabrina English MRN: [id number] Date of Birth: [birth year]   Important Message Given:  Yes - Medicare IM     Caren Macadam 01/13/2023, 11:01 AM

## 2023-01-13 NOTE — Plan of Care (Signed)

## 2023-01-16 DIAGNOSIS — E876 Hypokalemia: Secondary | ICD-10-CM | POA: Diagnosis not present

## 2023-01-16 DIAGNOSIS — I447 Left bundle-branch block, unspecified: Secondary | ICD-10-CM | POA: Diagnosis not present

## 2023-01-16 DIAGNOSIS — H8109 Meniere's disease, unspecified ear: Secondary | ICD-10-CM | POA: Diagnosis not present

## 2023-01-16 DIAGNOSIS — J9601 Acute respiratory failure with hypoxia: Secondary | ICD-10-CM | POA: Diagnosis not present

## 2023-01-16 DIAGNOSIS — E785 Hyperlipidemia, unspecified: Secondary | ICD-10-CM | POA: Diagnosis not present

## 2023-01-16 DIAGNOSIS — D631 Anemia in chronic kidney disease: Secondary | ICD-10-CM | POA: Diagnosis not present

## 2023-01-16 DIAGNOSIS — N1832 Chronic kidney disease, stage 3b: Secondary | ICD-10-CM | POA: Diagnosis not present

## 2023-01-16 DIAGNOSIS — H3530 Unspecified macular degeneration: Secondary | ICD-10-CM | POA: Diagnosis not present

## 2023-01-16 DIAGNOSIS — J189 Pneumonia, unspecified organism: Secondary | ICD-10-CM | POA: Diagnosis not present

## 2023-01-16 DIAGNOSIS — M6259 Muscle wasting and atrophy, not elsewhere classified, multiple sites: Secondary | ICD-10-CM | POA: Diagnosis not present

## 2023-01-16 DIAGNOSIS — E871 Hypo-osmolality and hyponatremia: Secondary | ICD-10-CM | POA: Diagnosis not present

## 2023-01-16 DIAGNOSIS — E0590 Thyrotoxicosis, unspecified without thyrotoxic crisis or storm: Secondary | ICD-10-CM | POA: Diagnosis not present

## 2023-01-17 DIAGNOSIS — J9601 Acute respiratory failure with hypoxia: Secondary | ICD-10-CM | POA: Diagnosis not present

## 2023-01-17 DIAGNOSIS — M4696 Unspecified inflammatory spondylopathy, lumbar region: Secondary | ICD-10-CM | POA: Diagnosis not present

## 2023-01-17 DIAGNOSIS — M6259 Muscle wasting and atrophy, not elsewhere classified, multiple sites: Secondary | ICD-10-CM | POA: Diagnosis not present

## 2023-01-17 DIAGNOSIS — R1310 Dysphagia, unspecified: Secondary | ICD-10-CM | POA: Diagnosis not present

## 2023-01-17 DIAGNOSIS — B370 Candidal stomatitis: Secondary | ICD-10-CM | POA: Diagnosis not present

## 2023-01-19 ENCOUNTER — Other Ambulatory Visit: Payer: Self-pay | Admitting: *Deleted

## 2023-01-19 DIAGNOSIS — J9601 Acute respiratory failure with hypoxia: Secondary | ICD-10-CM | POA: Diagnosis not present

## 2023-01-19 DIAGNOSIS — N1832 Chronic kidney disease, stage 3b: Secondary | ICD-10-CM | POA: Diagnosis not present

## 2023-01-19 DIAGNOSIS — M6259 Muscle wasting and atrophy, not elsewhere classified, multiple sites: Secondary | ICD-10-CM | POA: Diagnosis not present

## 2023-01-19 DIAGNOSIS — Z7189 Other specified counseling: Secondary | ICD-10-CM | POA: Diagnosis not present

## 2023-01-19 DIAGNOSIS — R1310 Dysphagia, unspecified: Secondary | ICD-10-CM | POA: Diagnosis not present

## 2023-01-19 DIAGNOSIS — M4696 Unspecified inflammatory spondylopathy, lumbar region: Secondary | ICD-10-CM | POA: Diagnosis not present

## 2023-01-19 DIAGNOSIS — F419 Anxiety disorder, unspecified: Secondary | ICD-10-CM | POA: Diagnosis not present

## 2023-01-19 DIAGNOSIS — J189 Pneumonia, unspecified organism: Secondary | ICD-10-CM | POA: Diagnosis not present

## 2023-01-19 DIAGNOSIS — D631 Anemia in chronic kidney disease: Secondary | ICD-10-CM | POA: Diagnosis not present

## 2023-01-19 DIAGNOSIS — B370 Candidal stomatitis: Secondary | ICD-10-CM | POA: Diagnosis not present

## 2023-01-19 DIAGNOSIS — H8109 Meniere's disease, unspecified ear: Secondary | ICD-10-CM | POA: Diagnosis not present

## 2023-01-19 DIAGNOSIS — B001 Herpesviral vesicular dermatitis: Secondary | ICD-10-CM | POA: Diagnosis not present

## 2023-01-19 DIAGNOSIS — Z8709 Personal history of other diseases of the respiratory system: Secondary | ICD-10-CM | POA: Diagnosis not present

## 2023-01-19 DIAGNOSIS — K219 Gastro-esophageal reflux disease without esophagitis: Secondary | ICD-10-CM | POA: Diagnosis not present

## 2023-01-19 NOTE — Patient Outreach (Signed)
Sabrina English resides in [place]. Screening for potential chronic care management services as benefit of health plan and PCP.  Facility site visit to [facility].  Met with therapy manager and social work team. Sabrina English is from home alone. Plan is to wean off of oxygen prior to discharge.   Went to bedside to speak with Sabrina English and daughter Sabrina English. Sabrina English reports she has nausea. States they have alerted staff. Confirms oxygen has been weaned off. Sabrina English reports she plans to return home upon SNF discharge.   Explained [facility] care management services. Sabrina English and daughter agreeable to follow up. Encouraged daughter Sabrina English to schedule PCP appointment.   Sabrina English states she should be called to schedule appointments at number [phone].  Sabrina English's home number is [phone].   Discussed Clinical research associate will plan to refer to [facility] CCM upon SNF discharge.   Writer stopped at nursing station to make nursing aware Sabrina English is still nauseated. Nursing states they will give Sabrina English something for nausea.   Will continue to follow and plan to refer upon SNF discharge.   Raiford Noble, MSN, RN, BSN [place]  [facility] RN Post- Acute Care Manager Direct Dial: [phone]

## 2023-01-23 DIAGNOSIS — M4696 Unspecified inflammatory spondylopathy, lumbar region: Secondary | ICD-10-CM | POA: Diagnosis not present

## 2023-01-23 DIAGNOSIS — M6259 Muscle wasting and atrophy, not elsewhere classified, multiple sites: Secondary | ICD-10-CM | POA: Diagnosis not present

## 2023-01-23 DIAGNOSIS — B370 Candidal stomatitis: Secondary | ICD-10-CM | POA: Diagnosis not present

## 2023-01-23 DIAGNOSIS — R1310 Dysphagia, unspecified: Secondary | ICD-10-CM | POA: Diagnosis not present

## 2023-01-23 DIAGNOSIS — J9601 Acute respiratory failure with hypoxia: Secondary | ICD-10-CM | POA: Diagnosis not present

## 2023-01-24 DIAGNOSIS — K219 Gastro-esophageal reflux disease without esophagitis: Secondary | ICD-10-CM | POA: Diagnosis not present

## 2023-01-24 DIAGNOSIS — Z8709 Personal history of other diseases of the respiratory system: Secondary | ICD-10-CM | POA: Diagnosis not present

## 2023-01-24 DIAGNOSIS — E785 Hyperlipidemia, unspecified: Secondary | ICD-10-CM | POA: Diagnosis not present

## 2023-01-24 DIAGNOSIS — F419 Anxiety disorder, unspecified: Secondary | ICD-10-CM | POA: Diagnosis not present

## 2023-01-24 DIAGNOSIS — D631 Anemia in chronic kidney disease: Secondary | ICD-10-CM | POA: Diagnosis not present

## 2023-01-24 DIAGNOSIS — N1832 Chronic kidney disease, stage 3b: Secondary | ICD-10-CM | POA: Diagnosis not present

## 2023-01-24 DIAGNOSIS — Z8701 Personal history of pneumonia (recurrent): Secondary | ICD-10-CM | POA: Diagnosis not present

## 2023-01-24 DIAGNOSIS — H8109 Meniere's disease, unspecified ear: Secondary | ICD-10-CM | POA: Diagnosis not present

## 2023-01-26 ENCOUNTER — Telehealth: Payer: Self-pay

## 2023-01-26 DIAGNOSIS — J188 Other pneumonia, unspecified organism: Secondary | ICD-10-CM | POA: Diagnosis not present

## 2023-01-26 DIAGNOSIS — I080 Rheumatic disorders of both mitral and aortic valves: Secondary | ICD-10-CM | POA: Diagnosis not present

## 2023-01-26 DIAGNOSIS — M810 Age-related osteoporosis without current pathological fracture: Secondary | ICD-10-CM | POA: Diagnosis not present

## 2023-01-26 DIAGNOSIS — Z602 Problems related to living alone: Secondary | ICD-10-CM | POA: Diagnosis not present

## 2023-01-26 DIAGNOSIS — D631 Anemia in chronic kidney disease: Secondary | ICD-10-CM | POA: Diagnosis not present

## 2023-01-26 DIAGNOSIS — J9601 Acute respiratory failure with hypoxia: Secondary | ICD-10-CM | POA: Diagnosis not present

## 2023-01-26 DIAGNOSIS — F419 Anxiety disorder, unspecified: Secondary | ICD-10-CM | POA: Diagnosis not present

## 2023-01-26 DIAGNOSIS — I1310 Hypertensive heart and chronic kidney disease without heart failure, with stage 1 through stage 4 chronic kidney disease, or unspecified chronic kidney disease: Secondary | ICD-10-CM | POA: Diagnosis not present

## 2023-01-26 DIAGNOSIS — N1832 Chronic kidney disease, stage 3b: Secondary | ICD-10-CM | POA: Diagnosis not present

## 2023-01-26 DIAGNOSIS — Z85820 Personal history of malignant melanoma of skin: Secondary | ICD-10-CM | POA: Diagnosis not present

## 2023-01-26 NOTE — Telephone Encounter (Signed)
Copied from CRM [id number]. Topic: Clinical - Home Health Verbal Orders >> Jan 26, 2023  4:49 PM Elle L wrote: Caller/Agency: [facility]  Callback Number: [phone] Service Requested: Physical Therapy Frequency: 1 week 1 time for 2 weeks 1 for 4 weeks balance and strength.  Any new concerns about the patient? No

## 2023-01-26 NOTE — Telephone Encounter (Signed)
Transition Care Management Follow-up Telephone Call Date of discharge and from where: 01/13/2023 [facility]  How have you been since you were released from the hospital? feels tired and weak.  Any questions or concerns? At the hospital they stopped her simvastatin, does she need to start it back?    Items Reviewed: Did the pt receive and understand the discharge instructions provided? Yes  Medications obtained and verified? Yes  Other?  Any new allergies since your discharge? No  Dietary orders reviewed? Yes  Do you have support at home? Yes, daughter.    Home Care and Equipment/Supplies: Were home health services ordered? PT If so, what is the name of the agency? [facility]   Has the agency set up a time to come to the patient's home? TWICE A WEEK.  Were any new equipment or medical supplies ordered? NO What is the name of the medical supply agency? NO    Were you able to get the supplies/equipment? n/a Do you have any questions related to the use of the equipment or supplies? n/a   Functional Questionnaire: (I = Independent and D = Dependent) ADLs: I   Bathing/Dressing- I    Meal Prep- D   Eating- I  Maintaining continence- I    Transferring/Ambulation- USES WALKER    Managing Meds- I   Follow up appointments reviewed:   PCP Hospital f/u appt confirmed? Yes  Specialist Hospital f/u appt confirmed? No Are transportation arrangements needed? No If their condition worsens, is the pt aware to call PCP or go to the Emergency Dept.? yes  Was the patient provided with contact information for the PCP's office or ED? yes  Was to pt encouraged to call back with questions or concerns? Yes

## 2023-01-27 NOTE — Telephone Encounter (Signed)
Copied from CRM [id number]. Topic: General - Other >> Jan 27, 2023 11:38 AM Hector Shade B wrote: Reason for CRM: Checking status on PT Orders for patient.   Subject Sabrina English "[name]" (Patient)   Topic Clinical - Home Health Verbal Orders   Communication: Please cal LiJi from [facility] to Update her on the orders submitted on 01/26/23 Caller/Agency: [facility]  Callback Number: [phone]  Service Requested: Physical Therapy  Frequency: 1 week 1 time for 2 weeks 1 for 4 weeks balance and strength.  Any new concerns about the patient? No

## 2023-01-27 NOTE — Telephone Encounter (Signed)
LVM for them to fax orders to office and gave them the fax number.

## 2023-01-28 DIAGNOSIS — N1832 Chronic kidney disease, stage 3b: Secondary | ICD-10-CM | POA: Diagnosis not present

## 2023-01-28 DIAGNOSIS — M810 Age-related osteoporosis without current pathological fracture: Secondary | ICD-10-CM | POA: Diagnosis not present

## 2023-01-28 DIAGNOSIS — D631 Anemia in chronic kidney disease: Secondary | ICD-10-CM | POA: Diagnosis not present

## 2023-01-28 DIAGNOSIS — J188 Other pneumonia, unspecified organism: Secondary | ICD-10-CM | POA: Diagnosis not present

## 2023-01-28 DIAGNOSIS — J9601 Acute respiratory failure with hypoxia: Secondary | ICD-10-CM | POA: Diagnosis not present

## 2023-01-28 DIAGNOSIS — I1310 Hypertensive heart and chronic kidney disease without heart failure, with stage 1 through stage 4 chronic kidney disease, or unspecified chronic kidney disease: Secondary | ICD-10-CM | POA: Diagnosis not present

## 2023-01-31 ENCOUNTER — Ambulatory Visit (INDEPENDENT_AMBULATORY_CARE_PROVIDER_SITE_OTHER): Payer: Medicare Other | Admitting: Internal Medicine

## 2023-01-31 ENCOUNTER — Ambulatory Visit
Admission: RE | Admit: 2023-01-31 | Discharge: 2023-01-31 | Disposition: A | Discharge status: Home / Self Care | Payer: Medicare Other | Source: Ambulatory Visit | Attending: Internal Medicine | Admitting: Internal Medicine

## 2023-01-31 ENCOUNTER — Encounter: Payer: Self-pay | Admitting: Internal Medicine

## 2023-01-31 VITALS — BP 138/80 | HR 82 | Ht 61.5 in | Wt 148.0 lb

## 2023-01-31 DIAGNOSIS — Z85820 Personal history of malignant melanoma of skin: Secondary | ICD-10-CM

## 2023-01-31 DIAGNOSIS — M8580 Other specified disorders of bone density and structure, unspecified site: Secondary | ICD-10-CM | POA: Diagnosis not present

## 2023-01-31 DIAGNOSIS — Z09 Encounter for follow-up examination after completed treatment for conditions other than malignant neoplasm: Secondary | ICD-10-CM

## 2023-01-31 DIAGNOSIS — R7989 Other specified abnormal findings of blood chemistry: Secondary | ICD-10-CM | POA: Diagnosis not present

## 2023-01-31 DIAGNOSIS — Z8659 Personal history of other mental and behavioral disorders: Secondary | ICD-10-CM | POA: Diagnosis not present

## 2023-01-31 DIAGNOSIS — H903 Sensorineural hearing loss, bilateral: Secondary | ICD-10-CM

## 2023-01-31 DIAGNOSIS — N1832 Chronic kidney disease, stage 3b: Secondary | ICD-10-CM | POA: Diagnosis not present

## 2023-01-31 DIAGNOSIS — R0600 Dyspnea, unspecified: Secondary | ICD-10-CM

## 2023-01-31 DIAGNOSIS — J189 Pneumonia, unspecified organism: Secondary | ICD-10-CM | POA: Diagnosis not present

## 2023-01-31 DIAGNOSIS — Z8719 Personal history of other diseases of the digestive system: Secondary | ICD-10-CM

## 2023-01-31 DIAGNOSIS — J984 Other disorders of lung: Secondary | ICD-10-CM | POA: Diagnosis not present

## 2023-01-31 MED ORDER — DOXYCYCLINE HYCLATE 100 MG PO TABS: 100.0000 mg | ORAL_TABLET | Freq: Two times a day (BID) | ORAL | 0 refills | Status: DC

## 2023-01-31 NOTE — Patient Instructions (Addendum)
Patient has persistent lower lobe densities bilaterally on chest x-ray.  Prescribing doxycycline 100 mg twice daily for 7 days.  Follow-up here in 10 days to 2 weeks and will need repeat chest x-ray at that time.

## 2023-01-31 NOTE — Progress Notes (Signed)
Patient Care Team: Margaree Mackintosh, MD as PCP - General (Internal Medicine)  Visit Date: 01/31/23  Subjective:   Chief Complaint  Patient presents with   Hospitalization Follow-up   Pneumonia   Patient Sabrina English,Female DOB:03-Jun-1933,89 y.o. BJY:[id number]   89 y.o. Female presents today for hospital/ED/UC follow-up from ***. From the ED Summary: "Elderly female presents with cough, shortness of breath. Patient has new oxygen requirement, 2 L to achieve saturation 94% this is abnormal, suggestive of pneumonia versus COVID. Patient has no chest pain, which is somewhat reassuring, low suspicion for ACS. EKG with left bundle branch block, otherwise noncontributory. With new oxygen requirement, x-ray consistent with bilateral pneumonia patient will require admission. Initial labs consistent with this, with mild leukocytosis. COVID pending on admission."   History of *** treated with   Treated wth oxygen abx (augmentin & Zpak) and prednisone Neg covid flu rsv  [facility] for 10 days Today she reports she is feeling improved, isn't coughing, but she does not have much of an appetite. Past Medical History:  Diagnosis Date   Cancer (HCC)    melanoma l shoulder   GERD (gastroesophageal reflux disease)    Hyperlipidemia    Macular degeneration    Melanoma (HCC)    Meniere's disease    Osteoporosis    osteopenia   Thyroid disease     Family History  Problem Relation Age of Onset   Mental illness Mother    Hyperlipidemia Mother    Cancer Father    Asthma Father    Cancer Sister    Hypothyroidism Brother    Allergic rhinitis Neg Hx    Angioedema Neg Hx    Atopy Neg Hx    Eczema Neg Hx    Immunodeficiency Neg Hx    Urticaria Neg Hx    Social History   Social History Narrative   Not on file   Review of Systems  Constitutional:  Negative for fever and malaise/fatigue.  HENT:  Positive for congestion (nasal).   Eyes:  Negative for blurred vision.   Respiratory:  Negative for cough and shortness of breath.   Cardiovascular:  Negative for chest pain, palpitations and leg swelling.  Gastrointestinal:  Negative for vomiting.  Musculoskeletal:  Negative for back pain.  Skin:  Negative for rash.  Neurological:  Negative for loss of consciousness and headaches.     Objective:  Vitals: BP 138/80   Pulse 82   Ht 5' 1.5" (1.562 m)   Wt 148 lb (67.1 kg)   SpO2 96%   BMI 27.51 kg/m   Physical Exam Vitals and nursing note reviewed.  Constitutional:      General: She is not in acute distress.    Appearance: Normal appearance. She is not toxic-appearing.  HENT:     Head: Normocephalic and atraumatic.     Right Ear: Tympanic membrane, ear canal and external ear normal.     Left Ear: Tympanic membrane, ear canal and external ear normal.     Ears:     Comments: Wears hearing aids Cardiovascular:     Rate and Rhythm: Normal rate and regular rhythm. No extrasystoles are present.    Pulses: Normal pulses.     Heart sounds: Normal heart sounds. No murmur heard.    No friction rub. No gallop.  Pulmonary:     Effort: Pulmonary effort is normal. No respiratory distress.     Comments: Faint inspiratory crackles, bilaterally Skin:    General: Skin is warm  and dry.  Neurological:     Mental Status: She is alert and oriented to person, place, and time. Mental status is at baseline.  Psychiatric:        Mood and Affect: Mood normal.        Behavior: Behavior normal.        Thought Content: Thought content normal.        Judgment: Judgment normal.     Results:  Studies Obtained And Personally Reviewed By Me:  DG Chest Port 1V  CLINICAL DATA:  Hypoxia  EXAM: PORTABLE CHEST 1 VIEW  COMPARISON:  01/07/2023  FINDINGS: Heart and mediastinal contours within normal limits. Aortic atherosclerosis. Layering bilateral pleural effusions. Worsening bilateral airspace disease, predominately in the lower lobes but also in the right upper  lobe. No acute bony abnormality.  IMPRESSION: Worsening bilateral airspace disease, right greater than left concerning for pneumonia.   DG Chest 2 View [[id number]]  CLINICAL DATA:  Shortness of breath for several days, initial encounter  EXAM: CHEST - 2 VIEW  COMPARISON:  06/08/2015  FINDINGS: Cardiac shadow is within normal limits. Tortuous thoracic aorta is noted with calcification. Lungs are well aerated bilaterally. Patchy bibasilar airspace opacity is noted right greater than left consistent with multifocal pneumonia. Postsurgical changes in the left axilla are seen.  IMPRESSION: Bibasilar infiltrates right greater than left.   Basic metabolic panel [[id number]] (Abnormal) Collected:01/13/23 0556 Order Status: Completed Specimen: Blood Updated:01/13/23 0713  Sodium 131 Low  mmol/L   Potassium 4.2 mmol/L   Chloride 99 mmol/L   CO2 22 mmol/L   Glucose, Bld 144 High  mg/dL   BUN 38 High  mg/dL   Creatinine, Ser 7.82 High  mg/dL   Calcium 9.2 mg/dL   GFR, Estimated 39 Low  mL/min   Anion gap 10    CBC with Differential/Platelet [[id number]] (Abnormal) Collected: 01/13/23 0556 Order Status: Completed Specimen: Blood Updated: 01/13/23 0635  WBC 13.5 High  K/uL   RBC 3.78 Low  MIL/uL   Hemoglobin 11.1 Low  g/dL   HCT 57.8 Low  %   MCV 88.6 fL   MCH 29.4 pg   MCHC 33.1 g/dL   RDW 46.9 %   Platelets 392 K/uL   nRBC 0.0 %   Neutrophils Relative % 91 %   Neutro Abs 12.2 High  K/uL   Lymphocytes Relative 6 %   Lymphs Abs 0.8 K/uL   Monocytes Relative 2 %   Monocytes Absolute 0.3 K/uL   Eosinophils Relative 0 %   Eosinophils Absolute 0.0 K/uL   Basophils Relative 0 %   Basophils Absolute 0.0 K/uL   Immature Granulocytes 1 %   Abs Immature Granulocytes 0.19 High  K/uL     Comprehensive metabolic panel [[id number]] (Abnormal) Collected: 01/12/23 0549 Order Status: Completed Specimen: Blood Updated: 01/12/23 0704  Sodium 132 Low  mmol/L   Potassium 3.4 Low   mmol/L   Chloride 97 Low  mmol/L   CO2 23 mmol/L   Glucose, Bld 145 High  mg/dL     BUN 33 High  mg/dL   Creatinine, Ser 2.44 High  mg/dL   Calcium 9.0 mg/dL   Total Protein 5.9 Low  g/dL   Albumin 2.2 Low  g/dL   AST 64 High  U/L   ALT 94 High  U/L   Alkaline Phosphatase 103 U/L   Total Bilirubin 0.5 mg/dL   GFR, Estimated 35 Low  mL/min     Anion gap 12    Brain  natriuretic peptide [[id number]] (Abnormal) Collected: 01/11/23 1559 Order Status: Completed Specimen: Blood Updated: 01/11/23 1716  B Natriuretic Peptide 166.2 High  pg/mL     Legionella Pneumophila Serogp 1 Ur Ag [[id number]] Collected: 01/08/23 0109 Order Status: Completed Specimen: Urine, Clean Catch Updated: 01/10/23 1435  L. pneumophila Serogp 1 Ur Ag Negative  Presumptive negative for L. pneumophila serogroup 1 antigen in urine, suggesting no recent or current infection. Legionnaires' disease cannot be ruled out since other serogroups and species may also cause disease.    Source of Sample URINE, CLEAN CATCH    Culture, Respiratory w Gram Stain [[id number]] Collected: 01/08/23 4010 Order Status: Completed Updated: 01/10/23 1317 Specimen Description --   EXPECTORATED SPUTUM Performed at [facility], [address], [place], [place] [address]  Special Requests --   NONE Reflexed from [phone] Performed at [facility], [address] [place], [place] [address]  Gram Stain --   FEW WBC PRESENT, PREDOMINANTLY PMN FEW SQUAMOUS EPITHELIAL CELLS PRESENT FEW GRAM NEGATIVE RODS RARE GRAM POSITIVE COCCI  Culture --   MODERATE Normal respiratory flora-no Staph aureus or Pseudomonas seen Performed at [facility], [address] [place], [place] [address]  Report Status 01/10/2023 FINAL  Comprehensive metabolic panel [[id number]] (Abnormal) Collected: 01/10/23 1228  Order Status: Completed Specimen: Blood Updated: 01/10/23 1255  Sodium 133 Low  mmol/L    Potassium 3.7 mmol/L   Chloride 99 mmol/L   CO2 23 mmol/L   Glucose, Bld 124 High  mg/dL   Comment: Glucose reference range applies only to samples taken after fasting for at least 8 hours.    BUN 32 High  mg/dL   Creatinine, Ser 1.88 High  mg/dL   Calcium 9.2 mg/dL   Total Protein 6.2 Low  g/dL   Albumin 2.5 Low  g/dL   AST 416 High  U/L   ALT 119 High  U/L   Alkaline Phosphatase 125 U/L   Total Bilirubin 0.6 mg/dL   GFR, Estimated 45 Low  mL/min   Comment: (NOTE) Calculated using the CKD-EPI Creatinine Equation (2021)    Anion gap 11  Comment: Performed at [facility], [address], [place], [place] [address]    Basic metabolic panel [[id number]] (Abnormal) Collected: 01/09/23 0535  Order Status: Completed Specimen: Blood Updated: 01/09/23 0619  Sodium 134 Low  mmol/L   Potassium 3.8 mmol/L   Chloride 102 mmol/L   CO2 23 mmol/L   Glucose, Bld 110 High  mg/dL   Comment: Glucose reference range applies only to samples taken after fasting for at least 8 hours.    BUN 43 High  mg/dL   Creatinine, Ser 5.57 High  mg/dL   Calcium 8.5 Low  mg/dL   GFR, Estimated 35 Low  mL/min   Comment: (NOTE) Calculated using the CKD-EPI Creatinine Equation (2021)    Anion gap 9  Comment: Performed at [facility], [address] [place], [place] [address]    Magnesium [[id number]] Collected: 01/09/23 0535  Order Status: Completed Specimen: Blood Updated: 01/09/23 0619  Magnesium 1.9 mg/dL   Comment: Performed at [facility], [address], [place], [place] [address]    CBC [[id number]] (Abnormal) Collected: 01/09/23 0535  Order Status: Completed Specimen: Blood Updated: 01/09/23 0558  WBC 12.1 High  K/uL   RBC 3.62 Low  MIL/uL   Hemoglobin 10.9 Low  g/dL   HCT 71.0 Low  %   MCV 90.6 fL  MCH 30.1 pg   MCHC 33.2 g/dL   RDW 16.1 %   Platelets 321 K/uL   nRBC 0.0 %     Expectorated Sputum Assessment w Gram Stain, Rflx  to Resp Cult [[id number]] Collected: 01/08/23 0927 Order Status: Completed Specimen: Expectorated Sputum Updated: 01/08/23 1241  Specimen Description EXPECTORATED SPUTUM  Special Requests NONE  Sputum evaluation --  Report Status 01/08/2023 FINAL  Procalcitonin [[id number]] Collected: 01/08/23 0656 Order Status: Completed Updated: 01/08/23 1220  Procalcitonin 2.96 ng/mL   Interpretation: PCT > 2 ng/mL: Systemic infection (sepsis) is likely, unless other causes are known. (NOTE)       Sepsis PCT Algorithm           Lower Respiratory Tract                                      Infection PCT Algorithm    ----------------------------     ----------------------------         PCT < 0.25 ng/mL                PCT < 0.10 ng/mL          Strongly encourage             Strongly discourage   discontinuation of antibiotics    initiation of antibiotics    ----------------------------     -----------------------------       PCT 0.25 - 0.50 ng/mL            PCT 0.10 - 0.25 ng/mL               OR       >80% decrease in PCT            Discourage initiation of                                            antibiotics      Encourage discontinuation           of antibiotics    ----------------------------     -----------------------------         PCT >= 0.50 ng/mL              PCT 0.26 - 0.50 ng/mL               AND       <80% decrease in PCT             Encourage initiation of                                             antibiotics       Encourage continuation           of antibiotics    ----------------------------     -----------------------------        PCT >= 0.50 ng/mL                  PCT > 0.50 ng/mL               AND         increase in PCT  Strongly encourage                                      initiation of antibiotics    Strongly encourage escalation           of antibiotics                                     -----------------------------                                            PCT <= 0.25 ng/mL                                                 OR                                        > 80% decrease in PCT                                      Discontinue / Do not initiate                                             antibiotics  Performed at [facility], [address] [place], [place] [address]    Strep pneumoniae urinary antigen [[id number]] Collected: 01/08/23 0109 Order Status: Completed Specimen: Urine, Clean Catch Updated: 01/08/23 1208  Strep Pneumo Urinary Antigen NEGATIVE    Respiratory (~20 pathogens) panel by PCR [[id number]] Collected: 01/08/23 0109 Order Status: Completed Specimen: Respiratory from Nasal Mucosa Updated: 01/08/23 1201  Adenovirus NOT DETECTED  Coronavirus 229E NOT DETECTED    Coronavirus HKU1 NOT DETECTED  Coronavirus NL63 NOT DETECTED  Coronavirus OC43 NOT DETECTED  Metapneumovirus NOT DETECTED  Rhinovirus / Enterovirus NOT DETECTED  Influenza A NOT DETECTED  Influenza B NOT DETECTED  Parainfluenza Virus 1 NOT DETECTED  Parainfluenza Virus 2 NOT DETECTED  Parainfluenza Virus 3 NOT DETECTED  Parainfluenza Virus 4 NOT DETECTED  Respiratory Syncytial Virus NOT DETECTED  Bordetella pertussis NOT DETECTED  Bordetella Parapertussis NOT DETECTED  Chlamydophila pneumoniae NOT DETECTED  Mycoplasma pneumoniae NOT DETECTED    Comprehensive metabolic panel [[id number]] (Abnormal) Collected: 01/08/23 0656 Order Status: Completed Specimen: Blood Updated: 01/08/23 0842  Sodium 133 Low  mmol/L   Potassium 3.4 Low  mmol/L   Chloride 101 mmol/L   CO2 22 mmol/L   Glucose, Bld 114 High  mg/dL     BUN 50 High  mg/dL   Creatinine, Ser 4.01 High  mg/dL   Calcium 8.2 Low  mg/dL   Total Protein 5.9 Low  g/dL   Albumin 2.4 Low  g/dL   AST 92 High  U/L   ALT 72 High  U/L   Alkaline Phosphatase 101 U/L   Total Bilirubin 0.8 mg/dL  GFR, Estimated 36 Low  mL/min     Anion gap 10    Magnesium  [[id number]] Collected: 01/08/23 0656 Order Status: Completed Specimen: Blood Updated: 01/08/23 0842  Magnesium 1.8 mg/dL     CBC [[id number]] (Abnormal) Collected: 01/08/23 0656 Order Status: Completed Specimen: Blood Updated: 01/08/23 0809  WBC 11.4 High  K/uL   RBC 3.94 MIL/uL   Hemoglobin 11.6 Low  g/dL   HCT 78.2 Low  %   MCV 88.1 fL   MCH 29.4 pg   MCHC 33.4 g/dL   RDW 95.6 %   Platelets 322 K/uL   nRBC 0.0 %     MRSA Next Gen by PCR, Nasal [[id number]] Collected: 01/08/23 0109 Order Status: Completed Specimen: Nasal Swab from Nasal Mucosa Updated: 01/08/23 0246  MRSA by PCR Next Gen NOT DETECTED    Resp panel by RT-PCR (RSV, Flu A&B, Covid) Anterior Nasal Swab [[id number]] Collected: 01/07/23 2051 Order Status: Completed Specimen: Anterior Nasal Swab Updated: 01/07/23 2348  SARS Coronavirus 2 by RT PCR NEGATIVE    Influenza A by PCR NEGATIVE  Influenza B by PCR NEGATIVE    Resp Syncytial Virus by PCR NEGATIVE    Brain natriuretic peptide [[id number]] (Abnormal) Collected: 01/07/23 2105 Order Status: Completed Specimen: Blood from Vein Updated: 01/07/23 2152  B Natriuretic Peptide 172.9 High  pg/mL     CBC with Differential [[id number]] (Abnormal) Collected: 01/07/23 2105 Order Status: Completed Specimen: Blood from Vein Updated: 01/07/23 2147  WBC 11.9 High  K/uL   RBC 4.35 MIL/uL   Hemoglobin 12.7 g/dL   HCT 03.4 %   MCV 74.2 fL   MCH 29.2 pg   MCHC 33.7 g/dL   RDW 59.5 %   Platelets 342 K/uL   nRBC 0.0 %   Neutrophils Relative % 78 %   Neutro Abs 9.3 High  K/uL   Lymphocytes Relative 10 %   Lymphs Abs 1.2 K/uL   Monocytes Relative 11 %   Monocytes Absolute 1.3 High  K/uL   Eosinophils Relative 0 %   Eosinophils Absolute 0.0 K/uL   Basophils Relative 0 %   Basophils Absolute 0.1 K/uL   Immature Granulocytes 1 %   Abs Immature Granulocytes 0.10 High  K/uL   Reactive, Benign Lymphocytes PRESENT    Comprehensive metabolic panel [[id number]]  (Abnormal) Collected: 01/07/23 2105 Order Status: Completed Specimen: Blood from Vein Updated: 01/07/23 2137  Sodium 132 Low  mmol/L   Potassium 2.8 Low  mmol/L   Chloride 98 mmol/L   CO2 21 Low  mmol/L   Glucose, Bld 126 High  mg/dL     BUN 57 High  mg/dL   Creatinine, Ser 2.95 High  mg/dL   Calcium 8.5 Low  mg/dL   Total Protein 6.6 g/dL   Albumin 2.8 Low  g/dL   AST 58 High  U/L   ALT 53 High  U/L   Alkaline Phosphatase 106 U/L   Total Bilirubin 0.8 mg/dL   GFR, Estimated 31 Low  mL/min     Anion gap 13   Labs:     Component Value Date/Time   NA 131 (L) 01/13/2023 0556   K 4.2 01/13/2023 0556   CL 99 01/13/2023 0556   CO2 22 01/13/2023 0556   GLUCOSE 144 (H) 01/13/2023 0556   BUN 38 (H) 01/13/2023 0556   CREATININE 1.33 (H) 01/13/2023 0556   CREATININE 1.44 (H) 06/27/2022 0924   CALCIUM 9.2 01/13/2023 0556   PROT 5.9 (L) 01/12/2023 0549   ALBUMIN  2.2 (L) 01/12/2023 0549   AST 64 (H) 01/12/2023 0549   ALT 94 (H) 01/12/2023 0549   ALKPHOS 103 01/12/2023 0549   BILITOT 0.5 01/12/2023 0549   GFRNONAA 39 (L) 01/13/2023 0556   GFRNONAA 35 (L) 06/23/2020 1111   GFRAA 40 (L) 06/23/2020 1111    Lab Results  Component Value Date   WBC 13.5 (H) 01/13/2023   HGB 11.1 (L) 01/13/2023   HCT 33.5 (L) 01/13/2023   MCV 88.6 01/13/2023   PLT 392 01/13/2023   Lab Results  Component Value Date   CHOL 176 06/27/2022   HDL 86 06/27/2022   LDLCALC 73 06/27/2022   TRIG 90 06/27/2022   CHOLHDL 2.0 06/27/2022   Lab Results  Component Value Date   TSH 1.03 08/05/2022   Assessment & Plan:   Repeat DG Chest View (ordered) to follow-up on PNA    I,Emily Lagle,acting as a scribe for Margaree Mackintosh, MD.,have documented all relevant documentation on the behalf of Margaree Mackintosh, MD,as directed by  Margaree Mackintosh, MD while in the presence of Margaree Mackintosh, MD.   ***

## 2023-02-01 ENCOUNTER — Encounter: Payer: Self-pay | Admitting: Internal Medicine

## 2023-02-01 ENCOUNTER — Other Ambulatory Visit: Payer: Self-pay | Admitting: *Deleted

## 2023-02-01 DIAGNOSIS — J188 Other pneumonia, unspecified organism: Secondary | ICD-10-CM | POA: Diagnosis not present

## 2023-02-01 DIAGNOSIS — I10 Essential (primary) hypertension: Secondary | ICD-10-CM

## 2023-02-01 DIAGNOSIS — I1310 Hypertensive heart and chronic kidney disease without heart failure, with stage 1 through stage 4 chronic kidney disease, or unspecified chronic kidney disease: Secondary | ICD-10-CM | POA: Diagnosis not present

## 2023-02-01 DIAGNOSIS — N1832 Chronic kidney disease, stage 3b: Secondary | ICD-10-CM | POA: Diagnosis not present

## 2023-02-01 DIAGNOSIS — D631 Anemia in chronic kidney disease: Secondary | ICD-10-CM | POA: Diagnosis not present

## 2023-02-01 DIAGNOSIS — J9601 Acute respiratory failure with hypoxia: Secondary | ICD-10-CM | POA: Diagnosis not present

## 2023-02-01 DIAGNOSIS — M810 Age-related osteoporosis without current pathological fracture: Secondary | ICD-10-CM | POA: Diagnosis not present

## 2023-02-01 LAB — CBC WITH DIFFERENTIAL/PLATELET
Absolute Lymphocytes: 1575 {cells}/uL (ref 850–3900)
Absolute Monocytes: 517 {cells}/uL (ref 200–950)
Basophils Absolute: 28 {cells}/uL (ref 0–200)
Basophils Relative: 0.6 %
Eosinophils Absolute: 179 {cells}/uL (ref 15–500)
Eosinophils Relative: 3.8 %
HCT: 39.3 % (ref 35.0–45.0)
Hemoglobin: 12.6 g/dL (ref 11.7–15.5)
MCH: 28.6 pg (ref 27.0–33.0)
MCHC: 32.1 g/dL (ref 32.0–36.0)
MCV: 89.3 fL (ref 80.0–100.0)
MPV: 9.1 fL (ref 7.5–12.5)
Monocytes Relative: 11 %
Neutro Abs: 2402 {cells}/uL (ref 1500–7800)
Neutrophils Relative %: 51.1 %
Platelets: 342 10*3/uL (ref 140–400)
RBC: 4.4 10*6/uL (ref 3.80–5.10)
RDW: 12.2 % (ref 11.0–15.0)
Total Lymphocyte: 33.5 %
WBC: 4.7 10*3/uL (ref 3.8–10.8)

## 2023-02-01 LAB — COMPLETE METABOLIC PANEL WITH GFR
AG Ratio: 1.4 (calc) (ref 1.0–2.5)
ALT: 19 U/L (ref 6–29)
AST: 21 U/L (ref 10–35)
Albumin: 3.7 g/dL (ref 3.6–5.1)
Alkaline phosphatase (APISO): 104 U/L (ref 37–153)
BUN/Creatinine Ratio: 12 (calc) (ref 6–22)
BUN: 19 mg/dL (ref 7–25)
CO2: 27 mmol/L (ref 20–32)
Calcium: 10.3 mg/dL (ref 8.6–10.4)
Chloride: 102 mmol/L (ref 98–110)
Creat: 1.59 mg/dL — ABNORMAL HIGH (ref 0.60–0.95)
Globulin: 2.6 g/dL (ref 1.9–3.7)
Glucose, Bld: 93 mg/dL (ref 65–99)
Potassium: 4.5 mmol/L (ref 3.5–5.3)
Sodium: 142 mmol/L (ref 135–146)
Total Bilirubin: 0.9 mg/dL (ref 0.2–1.2)
Total Protein: 6.3 g/dL (ref 6.1–8.1)
eGFR: 31 mL/min/{1.73_m2} — ABNORMAL LOW (ref >=60)

## 2023-02-01 LAB — BRAIN NATRIURETIC PEPTIDE: Brain Natriuretic Peptide: 100 pg/mL — ABNORMAL HIGH (ref <100)

## 2023-02-01 NOTE — Patient Outreach (Signed)
Post-Acute Care Manager follow up. Mrs. Swinger discharged from [facility] on 01/25/23.  Mrs. Linson and daughter previously agreed to [facility] care management services. See notes from 01/19/23.  Referral made for [facility] RN CM follow up. Mrs. Begeman has history of  hypertension, hyperlipidemia, CKD stage IIIb, macular degeneration, Mnire's disease, osteoporosis, hyperthyroidism, melanoma, GERD, and pneumonia.   Jasmine December Apple (daughter/DRP) stated she should be called to schedule appointments at number [phone]. Mrs. Sukup's home number is [phone].   Referral made for [facility] care management. Message sent to [facility] social work team to inquire about home health arrangements.   Raiford Noble, MSN, RN, BSN [facility]  [facility] RN Post- Acute Care Manager Direct Dial: [phone]

## 2023-02-01 NOTE — Progress Notes (Incomplete)
Patient Care Team: Margaree Mackintosh, MD as PCP - General (Internal Medicine)  Visit Date: 01/31/23  Subjective:   Chief Complaint  Patient presents with  . Hospitalization Follow-up  . Pneumonia   Patient Sabrina English L Dorsi,Female DOB:01-15-1933,89 y.o. FAO:[id number]   89 y.o. Female presents today for hospital/ED/UC follow-up from ***. From the ED Summary: "Elderly female presents with cough, shortness of breath. Patient has new oxygen requirement, 2 L to achieve saturation 94% this is abnormal, suggestive of pneumonia versus COVID. Patient has no chest pain, which is somewhat reassuring, low suspicion for ACS. EKG with left bundle branch block, otherwise noncontributory. With new oxygen requirement, x-ray consistent with bilateral pneumonia patient will require admission. Initial labs consistent with this, with mild leukocytosis. COVID pending on admission."   History of *** treated with   Treated wth oxygen abx (augmentin & Zpak) and prednisone Neg covid flu rsv   Went to [facility] for 10 days after hospital discharge. Today she reports she is feeling improved, isn't coughing, but she does not have much of an appetite. Past Medical History:  Diagnosis Date  . Cancer (HCC)    melanoma l shoulder  . GERD (gastroesophageal reflux disease)   . Hyperlipidemia   . Macular degeneration   . Melanoma (HCC)   . Meniere's disease   . Osteoporosis    osteopenia  . Thyroid disease     Family History  Problem Relation Age of Onset  . Mental illness Mother   . Hyperlipidemia Mother   . Cancer Father   . Asthma Father   . Cancer Sister   . Hypothyroidism Brother   . Allergic rhinitis Neg Hx   . Angioedema Neg Hx   . Atopy Neg Hx   . Eczema Neg Hx   . Immunodeficiency Neg Hx   . Urticaria Neg Hx    Social history: Widow.  Resides alone.  Daughter is supportive.  Review of Systems  Constitutional:  Negative for fever and malaise/fatigue.  HENT:  Positive for congestion  (nasal).   Eyes:  Negative for blurred vision.  Respiratory:  Negative for cough and shortness of breath.   Cardiovascular:  Negative for chest pain, palpitations and leg swelling.  Gastrointestinal:  Negative for vomiting.  Musculoskeletal:  Negative for back pain.  Skin:  Negative for rash.  Neurological:  Negative for loss of consciousness and headaches.     Objective:  Vitals: BP 138/80   Pulse 82   Ht 5' 1.5" (1.562 m)   Wt 148 lb (67.1 kg)   SpO2 96%   BMI 27.51 kg/m   Physical Exam Vitals and nursing note reviewed.  Constitutional:      General: She is not in acute distress.    Appearance: Normal appearance. She is not toxic-appearing.  HENT:     Head: Normocephalic and atraumatic.     Right Ear: Tympanic membrane, ear canal and external ear normal.     Left Ear: Tympanic membrane, ear canal and external ear normal.     Ears:     Comments: Wears hearing aids Cardiovascular:     Rate and Rhythm: Normal rate and regular rhythm. No extrasystoles are present.    Pulses: Normal pulses.     Heart sounds: Normal heart sounds. No murmur heard.    No friction rub. No gallop.  Pulmonary:     Effort: Pulmonary effort is normal. No respiratory distress.     Comments: Faint inspiratory crackles, bilaterally Skin:    General:  Skin is warm and dry.  Neurological:     Mental Status: She is alert and oriented to person, place, and time. Mental status is at baseline.  Psychiatric:        Mood and Affect: Mood normal.        Behavior: Behavior normal.        Thought Content: Thought content normal.        Judgment: Judgment normal.     Results:  Studies Obtained And Personally Reviewed By Me:  DG Chest Port 1V  CLINICAL DATA:  Hypoxia  EXAM: PORTABLE CHEST 1 VIEW  COMPARISON:  01/07/2023  FINDINGS: Heart and mediastinal contours within normal limits. Aortic atherosclerosis. Layering bilateral pleural effusions. Worsening bilateral airspace disease, predominately in  the lower lobes but also in the right upper lobe. No acute bony abnormality.  IMPRESSION: Worsening bilateral airspace disease, right greater than left concerning for pneumonia.   DG Chest 2 View [[id number]]  CLINICAL DATA:  Shortness of breath for several days, initial encounter  EXAM: CHEST - 2 VIEW  COMPARISON:  06/08/2015  FINDINGS: Cardiac shadow is within normal limits. Tortuous thoracic aorta is noted with calcification. Lungs are well aerated bilaterally. Patchy bibasilar airspace opacity is noted right greater than left consistent with multifocal pneumonia. Postsurgical changes in the left axilla are seen.  IMPRESSION: Bibasilar infiltrates right greater than left.   Basic metabolic panel [[id number]] (Abnormal) Collected:01/13/23 0556 Order Status: Completed Specimen: Blood Updated:01/13/23 0713  Sodium 131 Low  mmol/L   Potassium 4.2 mmol/L   Chloride 99 mmol/L   CO2 22 mmol/L   Glucose, Bld 144 High  mg/dL   BUN 38 High  mg/dL   Creatinine, Ser 2.72 High  mg/dL   Calcium 9.2 mg/dL   GFR, Estimated 39 Low  mL/min   Anion gap 10    CBC with Differential/Platelet [[id number]] (Abnormal) Collected: 01/13/23 0556 Order Status: Completed Specimen: Blood Updated: 01/13/23 0635  WBC 13.5 High  K/uL   RBC 3.78 Low  MIL/uL   Hemoglobin 11.1 Low  g/dL   HCT 74.2 Low  %   MCV 88.6 fL   MCH 29.4 pg   MCHC 33.1 g/dL   RDW 59.5 %   Platelets 392 K/uL   nRBC 0.0 %   Neutrophils Relative % 91 %   Neutro Abs 12.2 High  K/uL   Lymphocytes Relative 6 %   Lymphs Abs 0.8 K/uL   Monocytes Relative 2 %   Monocytes Absolute 0.3 K/uL   Eosinophils Relative 0 %   Eosinophils Absolute 0.0 K/uL   Basophils Relative 0 %   Basophils Absolute 0.0 K/uL   Immature Granulocytes 1 %   Abs Immature Granulocytes 0.19 High  K/uL     Comprehensive metabolic panel [[id number]] (Abnormal) Collected: 01/12/23 0549 Order Status: Completed Specimen: Blood Updated: 01/12/23 0704   Sodium 132 Low  mmol/L   Potassium 3.4 Low  mmol/L   Chloride 97 Low  mmol/L   CO2 23 mmol/L   Glucose, Bld 145 High  mg/dL     BUN 33 High  mg/dL   Creatinine, Ser 2.95 High  mg/dL   Calcium 9.0 mg/dL   Total Protein 5.9 Low  g/dL   Albumin 2.2 Low  g/dL   AST 64 High  U/L   ALT 94 High  U/L   Alkaline Phosphatase 103 U/L   Total Bilirubin 0.5 mg/dL   GFR, Estimated 35 Low  mL/min     Anion gap 12  Brain natriuretic peptide [[id number]] (Abnormal) Collected: 01/11/23 1559 Order Status: Completed Specimen: Blood Updated: 01/11/23 1716  B Natriuretic Peptide 166.2 High  pg/mL     Legionella Pneumophila Serogp 1 Ur Ag [[id number]] Collected: 01/08/23 0109 Order Status: Completed Specimen: Urine, Clean Catch Updated: 01/10/23 1435  L. pneumophila Serogp 1 Ur Ag Negative  Presumptive negative for L. pneumophila serogroup 1 antigen in urine, suggesting no recent or current infection. Legionnaires' disease cannot be ruled out since other serogroups and species may also cause disease.    Source of Sample URINE, CLEAN CATCH    Culture, Respiratory w Gram Stain [[id number]] Collected: 01/08/23 0865 Order Status: Completed Updated: 01/10/23 1317 Specimen Description --   EXPECTORATED SPUTUM Performed at [facility], [address], [place], [place] [address]  Special Requests --   NONE Reflexed from [phone] Performed at [facility], [address], [place], [place] [address]  Gram Stain --   FEW WBC PRESENT, PREDOMINANTLY PMN FEW SQUAMOUS EPITHELIAL CELLS PRESENT FEW GRAM NEGATIVE RODS RARE GRAM POSITIVE COCCI  Culture --   MODERATE Normal respiratory flora-no Staph aureus or Pseudomonas seen Performed at [facility], [address] [place], [place] [address]  Report Status 01/10/2023 FINAL  Comprehensive metabolic panel [[id number]] (Abnormal) Collected: 01/10/23 1228  Order Status: Completed Specimen: Blood Updated:  01/10/23 1255  Sodium 133 Low  mmol/L   Potassium 3.7 mmol/L   Chloride 99 mmol/L   CO2 23 mmol/L   Glucose, Bld 124 High  mg/dL   Comment: Glucose reference range applies only to samples taken after fasting for at least 8 hours.    BUN 32 High  mg/dL   Creatinine, Ser 3.47 High  mg/dL   Calcium 9.2 mg/dL   Total Protein 6.2 Low  g/dL   Albumin 2.5 Low  g/dL   AST 425 High  U/L   ALT 119 High  U/L   Alkaline Phosphatase 125 U/L   Total Bilirubin 0.6 mg/dL   GFR, Estimated 45 Low  mL/min   Comment: (NOTE) Calculated using the CKD-EPI Creatinine Equation (2021)    Anion gap 11  Comment: Performed at [facility], [address], [place], [place] [address]    Basic metabolic panel [[id number]] (Abnormal) Collected: 01/09/23 0535  Order Status: Completed Specimen: Blood Updated: 01/09/23 0619  Sodium 134 Low  mmol/L   Potassium 3.8 mmol/L   Chloride 102 mmol/L   CO2 23 mmol/L   Glucose, Bld 110 High  mg/dL   Comment: Glucose reference range applies only to samples taken after fasting for at least 8 hours.    BUN 43 High  mg/dL   Creatinine, Ser 1.88 High  mg/dL   Calcium 8.5 Low  mg/dL   GFR, Estimated 35 Low  mL/min   Comment: (NOTE) Calculated using the CKD-EPI Creatinine Equation (2021)    Anion gap 9  Comment: Performed at [facility], [address], [place], [place] [address]    Magnesium [[id number]] Collected: 01/09/23 0535  Order Status: Completed Specimen: Blood Updated: 01/09/23 0619  Magnesium 1.9 mg/dL   Comment: Performed at [facility], [address] [place], [place] [address]    CBC [[id number]] (Abnormal) Collected: 01/09/23 0535  Order Status: Completed Specimen: Blood Updated: 01/09/23 0558  WBC 12.1 High  K/uL   RBC 3.62 Low  MIL/uL   Hemoglobin 10.9 Low  g/dL   HCT 70.6 Low  %   MCV 90.6 fL  MCH 30.1 pg   MCHC 33.2 g/dL   RDW 40.9 %   Platelets 321 K/uL   nRBC 0.0 %      Expectorated Sputum Assessment w Gram Stain, Rflx to Resp Cult [[id number]] Collected: 01/08/23 0927 Order Status: Completed Specimen: Expectorated Sputum Updated: 01/08/23 1241  Specimen Description EXPECTORATED SPUTUM  Special Requests NONE  Sputum evaluation --  Report Status 01/08/2023 FINAL  Procalcitonin [[id number]] Collected: 01/08/23 0656 Order Status: Completed Updated: 01/08/23 1220  Procalcitonin 2.96 ng/mL   Interpretation: PCT > 2 ng/mL: Systemic infection (sepsis) is likely, unless other causes are known. (NOTE)       Sepsis PCT Algorithm           Lower Respiratory Tract                                      Infection PCT Algorithm    ----------------------------     ----------------------------         PCT < 0.25 ng/mL                PCT < 0.10 ng/mL          Strongly encourage             Strongly discourage   discontinuation of antibiotics    initiation of antibiotics    ----------------------------     -----------------------------       PCT 0.25 - 0.50 ng/mL            PCT 0.10 - 0.25 ng/mL               OR       >80% decrease in PCT            Discourage initiation of                                            antibiotics      Encourage discontinuation           of antibiotics    ----------------------------     -----------------------------         PCT >= 0.50 ng/mL              PCT 0.26 - 0.50 ng/mL               AND       <80% decrease in PCT             Encourage initiation of                                             antibiotics       Encourage continuation           of antibiotics    ----------------------------     -----------------------------        PCT >= 0.50 ng/mL                  PCT > 0.50 ng/mL               AND         increase in PCT  Strongly encourage                                      initiation of antibiotics    Strongly encourage escalation           of antibiotics                                      -----------------------------                                           PCT <= 0.25 ng/mL                                                 OR                                        > 80% decrease in PCT                                      Discontinue / Do not initiate                                             antibiotics  Performed at [facility], [address] [place], [place] [address]    Strep pneumoniae urinary antigen [[id number]] Collected: 01/08/23 0109 Order Status: Completed Specimen: Urine, Clean Catch Updated: 01/08/23 1208  Strep Pneumo Urinary Antigen NEGATIVE    Respiratory (~20 pathogens) panel by PCR [[id number]] Collected: 01/08/23 0109 Order Status: Completed Specimen: Respiratory from Nasal Mucosa Updated: 01/08/23 1201  Adenovirus NOT DETECTED  Coronavirus 229E NOT DETECTED    Coronavirus HKU1 NOT DETECTED  Coronavirus NL63 NOT DETECTED  Coronavirus OC43 NOT DETECTED  Metapneumovirus NOT DETECTED  Rhinovirus / Enterovirus NOT DETECTED  Influenza A NOT DETECTED  Influenza B NOT DETECTED  Parainfluenza Virus 1 NOT DETECTED  Parainfluenza Virus 2 NOT DETECTED  Parainfluenza Virus 3 NOT DETECTED  Parainfluenza Virus 4 NOT DETECTED  Respiratory Syncytial Virus NOT DETECTED  Bordetella pertussis NOT DETECTED  Bordetella Parapertussis NOT DETECTED  Chlamydophila pneumoniae NOT DETECTED  Mycoplasma pneumoniae NOT DETECTED    Comprehensive metabolic panel [[id number]] (Abnormal) Collected: 01/08/23 0656 Order Status: Completed Specimen: Blood Updated: 01/08/23 0842  Sodium 133 Low  mmol/L   Potassium 3.4 Low  mmol/L   Chloride 101 mmol/L   CO2 22 mmol/L   Glucose, Bld 114 High  mg/dL     BUN 50 High  mg/dL   Creatinine, Ser 8.41 High  mg/dL   Calcium 8.2 Low  mg/dL   Total Protein 5.9 Low  g/dL   Albumin 2.4 Low  g/dL   AST 92 High  U/L   ALT 72 High  U/L   Alkaline Phosphatase 101 U/L   Total Bilirubin 0.8 mg/dL  GFR,  Estimated 36 Low  mL/min     Anion gap 10    Magnesium [[id number]] Collected: 01/08/23 0656 Order Status: Completed Specimen: Blood Updated: 01/08/23 0842  Magnesium 1.8 mg/dL     CBC [[id number]] (Abnormal) Collected: 01/08/23 0656 Order Status: Completed Specimen: Blood Updated: 01/08/23 0809  WBC 11.4 High  K/uL   RBC 3.94 MIL/uL   Hemoglobin 11.6 Low  g/dL   HCT 40.1 Low  %   MCV 88.1 fL   MCH 29.4 pg   MCHC 33.4 g/dL   RDW 02.7 %   Platelets 322 K/uL   nRBC 0.0 %     MRSA Next Gen by PCR, Nasal [[id number]] Collected: 01/08/23 0109 Order Status: Completed Specimen: Nasal Swab from Nasal Mucosa Updated: 01/08/23 0246  MRSA by PCR Next Gen NOT DETECTED    Resp panel by RT-PCR (RSV, Flu A&B, Covid) Anterior Nasal Swab [[id number]] Collected: 01/07/23 2051 Order Status: Completed Specimen: Anterior Nasal Swab Updated: 01/07/23 2348  SARS Coronavirus 2 by RT PCR NEGATIVE    Influenza A by PCR NEGATIVE  Influenza B by PCR NEGATIVE    Resp Syncytial Virus by PCR NEGATIVE    Brain natriuretic peptide [[id number]] (Abnormal) Collected: 01/07/23 2105 Order Status: Completed Specimen: Blood from Vein Updated: 01/07/23 2152  B Natriuretic Peptide 172.9 High  pg/mL     CBC with Differential [[id number]] (Abnormal) Collected: 01/07/23 2105 Order Status: Completed Specimen: Blood from Vein Updated: 01/07/23 2147  WBC 11.9 High  K/uL   RBC 4.35 MIL/uL   Hemoglobin 12.7 g/dL   HCT 63.0 %   MCV 16.0 fL   MCH 29.2 pg   MCHC 33.7 g/dL   RDW 10.9 %   Platelets 342 K/uL   nRBC 0.0 %   Neutrophils Relative % 78 %   Neutro Abs 9.3 High  K/uL   Lymphocytes Relative 10 %   Lymphs Abs 1.2 K/uL   Monocytes Relative 11 %   Monocytes Absolute 1.3 High  K/uL   Eosinophils Relative 0 %   Eosinophils Absolute 0.0 K/uL   Basophils Relative 0 %   Basophils Absolute 0.1 K/uL   Immature Granulocytes 1 %   Abs Immature Granulocytes 0.10 High  K/uL   Reactive, Benign  Lymphocytes PRESENT    Comprehensive metabolic panel [[id number]] (Abnormal) Collected: 01/07/23 2105 Order Status: Completed Specimen: Blood from Vein Updated: 01/07/23 2137  Sodium 132 Low  mmol/L   Potassium 2.8 Low  mmol/L   Chloride 98 mmol/L   CO2 21 Low  mmol/L   Glucose, Bld 126 High  mg/dL     BUN 57 High  mg/dL   Creatinine, Ser 0.25 High  mg/dL   Calcium 8.5 Low  mg/dL   Total Protein 6.6 g/dL   Albumin 2.8 Low  g/dL   AST 58 High  U/L   ALT 53 High  U/L   Alkaline Phosphatase 106 U/L   Total Bilirubin 0.8 mg/dL   GFR, Estimated 31 Low  mL/min     Anion gap 13   Labs:     Component Value Date/Time   NA 131 (L) 01/13/2023 0556   K 4.2 01/13/2023 0556   CL 99 01/13/2023 0556   CO2 22 01/13/2023 0556   GLUCOSE 144 (H) 01/13/2023 0556   BUN 38 (H) 01/13/2023 0556   CREATININE 1.33 (H) 01/13/2023 0556   CREATININE 1.44 (H) 06/27/2022 0924   CALCIUM 9.2 01/13/2023 0556   PROT 5.9 (L) 01/12/2023 0549   ALBUMIN  2.2 (L) 01/12/2023 0549   AST 64 (H) 01/12/2023 0549   ALT 94 (H) 01/12/2023 0549   ALKPHOS 103 01/12/2023 0549   BILITOT 0.5 01/12/2023 0549   GFRNONAA 39 (L) 01/13/2023 0556   GFRNONAA 35 (L) 06/23/2020 1111   GFRAA 40 (L) 06/23/2020 1111    Lab Results  Component Value Date   WBC 13.5 (H) 01/13/2023   HGB 11.1 (L) 01/13/2023   HCT 33.5 (L) 01/13/2023   MCV 88.6 01/13/2023   PLT 392 01/13/2023   Lab Results  Component Value Date   CHOL 176 06/27/2022   HDL 86 06/27/2022   LDLCALC 73 06/27/2022   TRIG 90 06/27/2022   CHOLHDL 2.0 06/27/2022   Lab Results  Component Value Date   TSH 1.03 08/05/2022   Assessment & Plan:   Repeat DG Chest View (ordered) to follow-up on PNA    I,Emily Lagle,acting as a scribe for Margaree Mackintosh, MD.,have documented all relevant documentation on the behalf of Margaree Mackintosh, MD,as directed by  Margaree Mackintosh, MD while in the presence of Margaree Mackintosh, MD.   ***

## 2023-02-02 ENCOUNTER — Telehealth: Payer: Self-pay | Admitting: *Deleted

## 2023-02-02 ENCOUNTER — Inpatient Hospital Stay: Payer: Medicare Other | Admitting: Internal Medicine

## 2023-02-02 DIAGNOSIS — I1310 Hypertensive heart and chronic kidney disease without heart failure, with stage 1 through stage 4 chronic kidney disease, or unspecified chronic kidney disease: Secondary | ICD-10-CM | POA: Diagnosis not present

## 2023-02-02 DIAGNOSIS — D631 Anemia in chronic kidney disease: Secondary | ICD-10-CM | POA: Diagnosis not present

## 2023-02-02 DIAGNOSIS — M810 Age-related osteoporosis without current pathological fracture: Secondary | ICD-10-CM | POA: Diagnosis not present

## 2023-02-02 DIAGNOSIS — N1832 Chronic kidney disease, stage 3b: Secondary | ICD-10-CM | POA: Diagnosis not present

## 2023-02-02 DIAGNOSIS — J9601 Acute respiratory failure with hypoxia: Secondary | ICD-10-CM | POA: Diagnosis not present

## 2023-02-02 DIAGNOSIS — J188 Other pneumonia, unspecified organism: Secondary | ICD-10-CM | POA: Diagnosis not present

## 2023-02-02 NOTE — Progress Notes (Signed)
Complex Care Management Note  Care Guide Note 02/02/2023 Name: Sabrina English MRN: [id number] DOB: 04/19/1933  Sabrina English is a 89 y.o. year old female who sees Baxley, Luanna Cole, MD for primary care. I reached out to Sabrina English by phone today to offer complex care management services.  Ms. Giampietro daughter Rayetta Humphrey DPR on file was given information about Complex Care Management services today including:   The Complex Care Management services include support from the care team which includes your Nurse Coordinator, Clinical Social Worker, or Pharmacist.  The Complex Care Management team is here to help remove barriers to the health concerns and goals most important to you. Complex Care Management services are voluntary, and the patient may decline or stop services at any time by request to their care team member.   Complex Care Management Consent Status: Patient Daughter Rayetta Humphrey DPR on filr agreed to services and verbal consent obtained.   Follow up plan:  Telephone appointment with complex care management team member scheduled for:  2/3  Encounter Outcome:  Patient Scheduled  Gwenevere Ghazi  [facility]  [facility], [facility] Guide  Direct Dial: [phone]  Fax [phone]

## 2023-02-03 ENCOUNTER — Telehealth: Payer: Self-pay | Admitting: Internal Medicine

## 2023-02-03 DIAGNOSIS — I1310 Hypertensive heart and chronic kidney disease without heart failure, with stage 1 through stage 4 chronic kidney disease, or unspecified chronic kidney disease: Secondary | ICD-10-CM | POA: Diagnosis not present

## 2023-02-03 DIAGNOSIS — N1832 Chronic kidney disease, stage 3b: Secondary | ICD-10-CM | POA: Diagnosis not present

## 2023-02-03 DIAGNOSIS — M810 Age-related osteoporosis without current pathological fracture: Secondary | ICD-10-CM | POA: Diagnosis not present

## 2023-02-03 DIAGNOSIS — J188 Other pneumonia, unspecified organism: Secondary | ICD-10-CM | POA: Diagnosis not present

## 2023-02-03 DIAGNOSIS — D631 Anemia in chronic kidney disease: Secondary | ICD-10-CM | POA: Diagnosis not present

## 2023-02-03 DIAGNOSIS — J9601 Acute respiratory failure with hypoxia: Secondary | ICD-10-CM | POA: Diagnosis not present

## 2023-02-03 NOTE — Telephone Encounter (Signed)
-------  Fax Transmission Report-------  To:               Recipient at [phone] Subject:          Fw: Hp Scans Result:           The transmission was successful. Explanation:      All Pages Ok Pages Sent:       5 Connect Time:     1 minutes, 57 seconds Transmit Time:    02/03/2023 08:31 Transfer Rate:    14400 Status Code:      0000 Retry Count:      0 Job Id:           [id number] Unique Id:        [id number] Fax Line:         15 Fax Server:       [employer]

## 2023-02-03 NOTE — Telephone Encounter (Signed)
Received faxed orders from [facility] [phone], phone [phone] to verify medications to sign and fax back. Dr Lenord Fellers signed, we faxed back along with a copy of medication list.

## 2023-02-06 ENCOUNTER — Ambulatory Visit: Payer: Self-pay

## 2023-02-06 DIAGNOSIS — N1832 Chronic kidney disease, stage 3b: Secondary | ICD-10-CM | POA: Diagnosis not present

## 2023-02-06 DIAGNOSIS — I1310 Hypertensive heart and chronic kidney disease without heart failure, with stage 1 through stage 4 chronic kidney disease, or unspecified chronic kidney disease: Secondary | ICD-10-CM | POA: Diagnosis not present

## 2023-02-06 DIAGNOSIS — J188 Other pneumonia, unspecified organism: Secondary | ICD-10-CM | POA: Diagnosis not present

## 2023-02-06 DIAGNOSIS — D631 Anemia in chronic kidney disease: Secondary | ICD-10-CM | POA: Diagnosis not present

## 2023-02-06 DIAGNOSIS — M810 Age-related osteoporosis without current pathological fracture: Secondary | ICD-10-CM | POA: Diagnosis not present

## 2023-02-06 DIAGNOSIS — J9601 Acute respiratory failure with hypoxia: Secondary | ICD-10-CM | POA: Diagnosis not present

## 2023-02-07 NOTE — Patient Outreach (Signed)
  Care Coordination   Initial Visit Note   02/06/2023 Name: Sabrina English MRN: [id number] DOB: 1933/12/05  Sabrina English is a 89 y.o. year old female who sees Baxley, Luanna Cole, MD for primary care. I spoke with daughter Rayetta Humphrey by phone today.  What matters to the patients health and wellness today?  Patient would like to make a full recovery from pneumonia.     Goals Addressed             This Visit's Progress    To fully recover from pneumonia       Care Coordination Interventions: Completed successful outbound call with daughter Rayetta Humphrey  Evaluation of current treatment plan related to pneumonia and patient's adherence to plan as established by provider Reviewed and discussed patient's recent hospital admission for treatment of pneumonia Review of patient status, including review of consultant's reports, relevant laboratory and other test results, and medications completed Assessed for hydration/nutrition status, educated daughter regarding hydration, aim for having patient drink 48-64 oz daily unless otherwise directed, encourage proteins and fresh vegetables to help ensure a healthy well balanced diet Educated daughter with rationale regarding importance of adhering to use of incentive spirometer Educated and instructed on how to perform pursed lip breathing exercises if patient does not like using the IS Instructed daughter to notify PCP of new symptoms or concerns Reviewed and discussed upcoming scheduled visits with Endocrinology for thyroid check up and next scheduled follow up with PCP for pneumonia recheck Discussed plans with patient for ongoing care coordination follow up and provided patient with direct contact information for nurse care coordinator       Interventions Today    Flowsheet Row Most Recent Value  Chronic Disease   Chronic disease during today's visit Other  [pneumonia]  General Interventions   General Interventions Discussed/Reviewed  General Interventions Discussed, General Interventions Reviewed, Doctor Visits, Durable Medical Equipment (DME)  Doctor Visits Discussed/Reviewed Doctor Visits Discussed, Doctor Visits Reviewed, PCP  Exercise Interventions   Exercise Discussed/Reviewed Physical Activity, Exercise Discussed, Exercise Reviewed  Physical Activity Discussed/Reviewed Physical Activity Reviewed, Physical Activity Discussed, Home Exercise Program (HEP)  Education Interventions   Education Provided Provided Education  Provided Verbal Education On When to see the doctor, Medication, Exercise, Nutrition, Labs  Nutrition Interventions   Nutrition Discussed/Reviewed Nutrition Discussed, Nutrition Reviewed, Fluid intake  Pharmacy Interventions   Pharmacy Dicussed/Reviewed Pharmacy Topics Reviewed, Pharmacy Topics Discussed  Safety Interventions   Safety Discussed/Reviewed Home Safety, Fall Risk, Safety Reviewed, Safety Discussed  Home Safety Assistive Devices          SDOH assessments and interventions completed:  Yes  SDOH Interventions Today    Flowsheet Row Most Recent Value  SDOH Interventions   Food Insecurity Interventions Intervention Not Indicated  Housing Interventions Intervention Not Indicated  Transportation Interventions Intervention Not Indicated  Utilities Interventions Intervention Not Indicated        Care Coordination Interventions:  Yes, provided   Follow up plan: Follow up call scheduled for 02/20/23 @11 :00 AM    Encounter Outcome:  Patient Visit Completed

## 2023-02-07 NOTE — Patient Instructions (Signed)
Visit Information  Thank you for taking time to visit with me today. Please don't hesitate to contact me if I can be of assistance to you.   Following are the goals we discussed today:   Goals Addressed             This Visit's Progress    To fully recover from pneumonia       Care Coordination Interventions: Completed successful outbound call with daughter Rayetta Humphrey  Evaluation of current treatment plan related to pneumonia and patient's adherence to plan as established by provider Reviewed and discussed patient's recent hospital admission for treatment of pneumonia Review of patient status, including review of consultant's reports, relevant laboratory and other test results, and medications completed Assessed for hydration/nutrition status, educated daughter regarding hydration, aim for having patient drink 48-64 oz daily unless otherwise directed, encourage proteins and fresh vegetables to help ensure a healthy well balanced diet Educated daughter with rationale regarding importance of adhering to use of incentive spirometer Educated and instructed on how to perform pursed lip breathing exercises if patient does not like using the IS Instructed daughter to notify PCP of new symptoms or concerns Reviewed and discussed upcoming scheduled visits with Endocrinology for thyroid check up and next scheduled follow up with PCP for pneumonia recheck Discussed plans with patient for ongoing care coordination follow up and provided patient with direct contact information for nurse care coordinator           Our next appointment is by telephone on 02/20/23 at 11:00 AM  Please call the care guide team at [phone] if you need to cancel or reschedule your appointment.   If you are experiencing a Mental Health or Behavioral Health Crisis or need someone to talk to, please call 1-800-273-TALK (toll free, 24 hour hotline)  Patient verbalizes understanding of instructions and care plan  provided today and agrees to view in MyChart. Active MyChart status and patient understanding of how to access instructions and care plan via MyChart confirmed with patient.     Delsa Sale RN BSN CCM [place]  [facility] Nurse Care Coordinator  Direct Dial: [phone] Website: [email or web address]

## 2023-02-08 ENCOUNTER — Telehealth: Payer: Self-pay | Admitting: Internal Medicine

## 2023-02-08 DIAGNOSIS — D631 Anemia in chronic kidney disease: Secondary | ICD-10-CM

## 2023-02-08 DIAGNOSIS — J9601 Acute respiratory failure with hypoxia: Secondary | ICD-10-CM

## 2023-02-08 DIAGNOSIS — Z602 Problems related to living alone: Secondary | ICD-10-CM

## 2023-02-08 DIAGNOSIS — J188 Other pneumonia, unspecified organism: Secondary | ICD-10-CM

## 2023-02-08 DIAGNOSIS — I1310 Hypertensive heart and chronic kidney disease without heart failure, with stage 1 through stage 4 chronic kidney disease, or unspecified chronic kidney disease: Secondary | ICD-10-CM

## 2023-02-08 DIAGNOSIS — N1832 Chronic kidney disease, stage 3b: Secondary | ICD-10-CM

## 2023-02-08 DIAGNOSIS — M810 Age-related osteoporosis without current pathological fracture: Secondary | ICD-10-CM | POA: Diagnosis not present

## 2023-02-08 NOTE — Telephone Encounter (Addendum)
 Received faxed orders  to certify and sign from [facility] [phone] phone [phone]  Order #[id number] Order #[id number]  Certification period  01/26/2023 to 03/26/2023

## 2023-02-08 NOTE — Telephone Encounter (Signed)
-------  Fax Transmission Report-------  To:               Recipient at [phone] Subject:          Fw: Hp Scans Result:           The transmission was successful. Explanation:      All Pages Ok Pages Sent:       9 Connect Time:     5 minutes, 41 seconds Transmit Time:    02/08/2023 12:24 Transfer Rate:    14400 Status Code:      0000 Retry Count:      0 Job Id:           [id number] Unique Id:        [id number] Fax Line:         52 Fax Server:       [id number]

## 2023-02-09 ENCOUNTER — Telehealth: Payer: Self-pay | Admitting: Internal Medicine

## 2023-02-09 NOTE — Telephone Encounter (Signed)
-------  Fax Transmission Report-------  To:               Recipient at [phone] Subject:          Fw: Hp Scans Result:           The transmission was successful. Explanation:      All Pages Ok Pages Sent:       3 Connect Time:     1 minutes, 16 seconds Transmit Time:    02/09/2023 11:14 Transfer Rate:    14400 Status Code:      0000 Retry Count:      0 Job Id:           [id number] Unique Id:        [id number] Fax Line:         69 Fax Server:       [id number]

## 2023-02-09 NOTE — Telephone Encounter (Signed)
 Received faxed orders to verify medication from [facility] [phone], phone [phone]  Order [phone]

## 2023-02-10 ENCOUNTER — Encounter: Payer: Self-pay | Admitting: Internal Medicine

## 2023-02-10 ENCOUNTER — Ambulatory Visit (INDEPENDENT_AMBULATORY_CARE_PROVIDER_SITE_OTHER): Payer: Medicare Other | Admitting: Internal Medicine

## 2023-02-10 VITALS — BP 122/70 | HR 82 | Ht 61.5 in | Wt 148.0 lb

## 2023-02-10 DIAGNOSIS — E0590 Thyrotoxicosis, unspecified without thyrotoxic crisis or storm: Secondary | ICD-10-CM

## 2023-02-10 MED ORDER — METHIMAZOLE 5 MG PO TABS: 5.0000 mg | ORAL_TABLET | ORAL | 3 refills | Status: DC

## 2023-02-10 NOTE — Progress Notes (Signed)
 Name: Sabrina English  MRN/ DOB: [id number], 08-13-1933    Age/ Sex: 89 y.o., female     PCP: Perri Ronal PARAS, MD   Reason for Endocrinology Evaluation: hyperthyroidism     Initial Endocrinology Clinic Visit: 02/27/2019    PATIENT IDENTIFIER: [name] is a 89 y.o., female with a past medical history of hyperthyriodism, GERD, pulmonary nodules . She has followed with [facility] since 02/27/2019 for consultative assistance with management of her hyperthyroidism.   HISTORICAL SUMMARY: The patient was first diagnosed with hyperthyroidism in 2013, with a nadir of 0.20 uIU/mL in 2018  but was not started on any thionamides therapy until August 2020.  The reason for treatment was symptomatic and osteopenia   Mother with thyroid  disease ( hypothyroidism )  SUBJECTIVE:    Today (02/10/2023):  [name] is here for a follow-up on hyperthyroidism.  She is accompanied by her daughter today.  Patient was admitted for pneumonia and acute respiratory failure with hypoxia 01/07/2023    Pt has been noted with weight loss  Denies cough or fever  Denies local neck swelling  She had thrush which resulted in odynophagia  Denies palpitations  Has occasional tremors  Denies constipation or diarrhea  Has noted low appetite   Methimazole  5 mg , 1 tablet every other day   HISTORY:  Past Medical History:  Past Medical History:  Diagnosis Date   Cancer (HCC)    melanoma l shoulder   GERD (gastroesophageal reflux disease)    Hyperlipidemia    Macular degeneration    Melanoma (HCC)    Meniere's disease    Osteoporosis    osteopenia   Thyroid  disease    Past Surgical History:  Past Surgical History:  Procedure Laterality Date   AXILLARY NODE DISSECTION  1983   BUNIONECTOMY     CATARACT Bilateral    MELANOMA EXCISION     left shoulder   Social History:  reports that she has never smoked. She has never used smokeless tobacco. She reports that she does  not drink alcohol and does not use drugs. Family History:  Family History  Problem Relation Age of Onset   Mental illness Mother    Hyperlipidemia Mother    Cancer Father    Asthma Father    Cancer Sister    Hypothyroidism Brother    Allergic rhinitis Neg Hx    Angioedema Neg Hx    Atopy Neg Hx    Eczema Neg Hx    Immunodeficiency Neg Hx    Urticaria Neg Hx      HOME MEDICATIONS: Allergies as of 02/10/2023       Reactions   Levaquin  [levofloxacin  In D5w] Other (See Comments)   Lower extremity pain         Medication List        Accurate as of February 10, 2023  1:03 PM. If you have any questions, ask your nurse or doctor.          albuterol  (2.5 MG/3ML) 0.083% nebulizer solution Commonly known as: PROVENTIL  Take 3 mLs (2.5 mg total) by nebulization every 6 (six) hours as needed (with Mucomyst ).   albuterol  108 (90 Base) MCG/ACT inhaler Commonly known as: VENTOLIN  HFA Inhale 2 puffs into the lungs every 4 (four) hours as needed for wheezing or shortness of breath.   calcium -vitamin D  500-200 MG-UNIT tablet Commonly known as: OSCAL WITH D Take 1 tablet by mouth daily.   cholecalciferol  25 MCG (1000  UNIT) tablet Commonly known as: VITAMIN D3 Take 1,000 Units by mouth daily.   cyclobenzaprine  10 MG tablet Commonly known as: FLEXERIL  Take 1 tablet (10 mg total) by mouth at bedtime. What changed:  when to take this reasons to take this   dicyclomine  20 MG tablet Commonly known as: BENTYL  Take 1 tablet (20 mg total) by mouth daily.   doxycycline  100 MG tablet Commonly known as: VIBRA -TABS Take 1 tablet (100 mg total) by mouth 2 (two) times daily.   famotidine  20 MG tablet Commonly known as: PEPCID  Take 1 tablet (20 mg total) by mouth 2 (two) times daily.   ferrous sulfate  325 (65 FE) MG EC tablet Take 325 mg by mouth daily with breakfast.   ipratropium 0.06 % nasal spray Commonly known as: ATROVENT  USE 2 SPRAYS IN BOTH NOSTRILS  EVERY 6 HOURS IF  NEEDED TO DRY  UP NOSE What changed:  how much to take how to take this when to take this additional instructions   latanoprost  0.005 % ophthalmic solution Commonly known as: XALATAN  Place 1 drop into both eyes at bedtime.   LIPOFLAVONOID PO Take 1 tablet by mouth 2 (two) times daily.   meclizine  25 MG tablet Commonly known as: ANTIVERT  Take 1 tablet (25 mg total) by mouth 3 (three) times daily as needed. What changed: reasons to take this   methimazole  5 MG tablet Commonly known as: TAPAZOLE  Take 1 tablet (5 mg total) by mouth every other day.   mometasone -formoterol  100-5 MCG/ACT Aero Commonly known as: DULERA  Inhale 2 puffs into the lungs 2 (two) times daily.   nystatin 100000 UNIT/ML suspension Commonly known as: MYCOSTATIN Take by mouth.   ondansetron  4 MG disintegrating tablet Commonly known as: ZOFRAN -ODT Take 4 mg by mouth every 6 (six) hours as needed.   PRESCRIPTION MEDICATION Take 8 mg by mouth daily. Betahistine Dihydrochloride 8 mg   PRESCRIPTION MEDICATION Place 1 spray into the nose daily. Unknown nasal spray   PRESERVISION AREDS PO Take 1 tablet by mouth in the morning and at bedtime.   triamterene -hydrochlorothiazide 37.5-25 MG capsule Commonly known as: DYAZIDE TAKE 1 CAPSULE BY MOUTH EVERY DAY   valACYclovir 1000 MG tablet Commonly known as: VALTREX Take 2,000 mg by mouth 2 (two) times daily.          OBJECTIVE:   PHYSICAL EXAM: VS: BP 122/70 (BP Location: Right Arm, Patient Position: Sitting, Cuff Size: Normal)   Pulse 82   Ht 5' 1.5 (1.562 m)   Wt 148 lb (67.1 kg)   SpO2 98%   BMI 27.51 kg/m    EXAM: General: Pt appears well and is in NAD  Neck: General: Supple without adenopathy. Thyroid : Thyroid  size normal.  No goiter or nodules appreciated.  Lungs: Clear with good BS bilat   Heart: Auscultation: RRR.  Extremities:  BL LE: No pretibial edema   Mental Status: Judgment, insight: Intact Orientation: Oriented to time,  place, and person Mood and affect: No depression, anxiety, or agitation     DATA REVIEWED:  Latest Reference Range & Units 02/10/23 13:20  TSH 0.40 - 4.50 mIU/L 0.39 (L)  Triiodothyronine,Free,Serum 2.3 - 4.2 pg/mL 3.7  T4,Free(Direct) 0.8 - 1.8 ng/dL 1.3     Latest Reference Range & Units 01/31/23 12:21  Sodium 135 - 146 mmol/L 142  Potassium 3.5 - 5.3 mmol/L 4.5  Chloride 98 - 110 mmol/L 102  CO2 20 - 32 mmol/L 27  Glucose 65 - 99 mg/dL 93  BUN 7 -  25 mg/dL 19  Creatinine 9.39 - 9.04 mg/dL 8.40 (H)  Calcium  8.6 - 10.4 mg/dL 89.6  BUN/Creatinine Ratio 6 - 22 (calc) 12  eGFR > OR = 60 mL/min/1.71m2 31 (L)  AG Ratio 1.0 - 2.5 (calc) 1.4  AST 10 - 35 U/L 21  ALT 6 - 29 U/L 19  Total Protein 6.1 - 8.1 g/dL 6.3  Total Bilirubin 0.2 - 1.2 mg/dL 0.9    ASSESSMENT / PLAN / RECOMMENDATIONS:   Hyperthyroidism:   - She has been on methimazole  without side effects  - She is clinically euthyroid  - No local neck symptoms  -TFTs show low TSH, will adjust methimazole  as below -Recheck in 2 months  Medications   Take methimazole  5 mg 1 tablet Monday through Saturday and none on  Sundays  F/U in 6 months   Signed electronically by: Stefano Redgie Butts, MD  [facility]  [facility] [address] [place], [place] [address] Phone: [phone] FAX: [phone]      CC: Perri Ronal PARAS, MD [address] [place] [address] Phone: [phone]  Fax: [phone]   Return to Endocrinology clinic as below: Future Appointments  Date Time Provider Department Center  02/16/2023  2:00 PM Perri Ronal PARAS, MD MJB-MJB MJB  02/20/2023 11:00 AM Little, Clayborne CROME, RN THN-CCC None  02/24/2023 10:00 AM Beather Delon Gibson, PA LBGI-GI [facility]astro  07/10/2023 10:00 AM MJB-LAB MJB-MJB MJB  07/14/2023 11:00 AM Baxley, Ronal PARAS, MD MJB-MJB MJB

## 2023-02-11 LAB — T4, FREE: Free T4: 1.3 ng/dL (ref 0.8–1.8)

## 2023-02-11 LAB — T3, FREE: T3, Free: 3.7 pg/mL (ref 2.3–4.2)

## 2023-02-11 LAB — TSH: TSH: 0.39 m[IU]/L — ABNORMAL LOW (ref 0.40–4.50)

## 2023-02-13 ENCOUNTER — Telehealth: Payer: Self-pay | Admitting: Internal Medicine

## 2023-02-13 MED ORDER — METHIMAZOLE 5 MG PO TABS: 5.0000 mg | ORAL_TABLET | ORAL | 3 refills | Status: DC

## 2023-02-13 NOTE — Telephone Encounter (Signed)
 Please let the patient know that her thyroid  is overactive again.  She is currently on methimazole  every other day   I would suggest changing methimazole  to 1 tablet Monday through Saturdays and none on Sundays   Patient will need to use a pillbox, if she has not been using one   Please schedule for repeat labs in 2 months   Thanks

## 2023-02-13 NOTE — Telephone Encounter (Signed)
 Patient daughter notified and lab appointment scheduled

## 2023-02-15 DIAGNOSIS — H353114 Nonexudative age-related macular degeneration, right eye, advanced atrophic with subfoveal involvement: Secondary | ICD-10-CM | POA: Diagnosis not present

## 2023-02-16 ENCOUNTER — Ambulatory Visit: Payer: Medicare Other | Admitting: Internal Medicine

## 2023-02-16 ENCOUNTER — Encounter: Payer: Self-pay | Admitting: Internal Medicine

## 2023-02-16 VITALS — BP 110/70 | HR 76 | Temp 98.7°F | Ht 61.5 in | Wt 149.0 lb

## 2023-02-16 DIAGNOSIS — J189 Pneumonia, unspecified organism: Secondary | ICD-10-CM | POA: Diagnosis not present

## 2023-02-16 DIAGNOSIS — N1832 Chronic kidney disease, stage 3b: Secondary | ICD-10-CM | POA: Diagnosis not present

## 2023-02-16 DIAGNOSIS — Z09 Encounter for follow-up examination after completed treatment for conditions other than malignant neoplasm: Secondary | ICD-10-CM | POA: Diagnosis not present

## 2023-02-16 DIAGNOSIS — E0590 Thyrotoxicosis, unspecified without thyrotoxic crisis or storm: Secondary | ICD-10-CM

## 2023-02-16 DIAGNOSIS — J188 Other pneumonia, unspecified organism: Secondary | ICD-10-CM | POA: Diagnosis not present

## 2023-02-16 DIAGNOSIS — R9389 Abnormal findings on diagnostic imaging of other specified body structures: Secondary | ICD-10-CM

## 2023-02-16 DIAGNOSIS — J9601 Acute respiratory failure with hypoxia: Secondary | ICD-10-CM | POA: Diagnosis not present

## 2023-02-16 DIAGNOSIS — Z8659 Personal history of other mental and behavioral disorders: Secondary | ICD-10-CM | POA: Diagnosis not present

## 2023-02-16 DIAGNOSIS — M810 Age-related osteoporosis without current pathological fracture: Secondary | ICD-10-CM | POA: Diagnosis not present

## 2023-02-16 DIAGNOSIS — D631 Anemia in chronic kidney disease: Secondary | ICD-10-CM | POA: Diagnosis not present

## 2023-02-16 DIAGNOSIS — I1310 Hypertensive heart and chronic kidney disease without heart failure, with stage 1 through stage 4 chronic kidney disease, or unspecified chronic kidney disease: Secondary | ICD-10-CM | POA: Diagnosis not present

## 2023-02-16 NOTE — Progress Notes (Signed)
 Patient Care Team: Margaree Mackintosh, MD as PCP - General (Internal Medicine)  Visit Date: 02/16/23  Subjective:   Chief Complaint  Patient presents with   pneumonia follow up   Patient Sabrina English,Female DOB:06-May-1933,89 y.o. WGN:[id number]   89 y.o. Female presents today for 3 week follow-up for PNA. Patient has a past medical history of Elevated BNP and CKD stage 3b. Initially seen at [facility] Jan. 4th for Multifocal PNA, which was found on DG Chest 2 View with bibasilar infiltrates R>L. Repeat Chest View done 01/11/23 noted worsening bilateral airspace disease, R>L. She followed up with [place] on 1/28, and CXR from then found improved aeration of the lung field compared to previous views w/ mild residual interstitial opacities in bilateral lung bases and within periphery of right upper lobe. Treated with 100 mg Doxycycline BID x7 days. Today she follows-up again to inform us as to how she is recovering: reports that she did experience nausea, but no vomiting while taking the antibiotics, though she notes that she hadn't eaten much while taking them. Expresses that she'd like to go to a funeral for a dear friend - discussed this and instructed that as long as she wears a mask, doesn't stay too long, and doesn't come into close contact (such as hugs) then she should be fine to present there.    History of Hyperthyroidism treated with 5 mg Methimazole, now taking 6 days/week. Followed by Terrace Arabia, MD, who she last saw 02/10/23, where her TSH was 0.39; Free T4 1.3; Free T3 3.7.   Past Medical History:  Diagnosis Date   Cancer (HCC)    melanoma l shoulder   GERD (gastroesophageal reflux disease)    Hyperlipidemia    Macular degeneration    Melanoma (HCC)    Meniere's disease    Osteoporosis    osteopenia   Thyroid disease     Allergies  Allergen Reactions   Levaquin [Levofloxacin In D5w] Other (See Comments)    Lower extremity pain     Family History   Problem Relation Age of Onset   Mental illness Mother    Hyperlipidemia Mother    Cancer Father    Asthma Father    Cancer Sister    Hypothyroidism Brother    Allergic rhinitis Neg Hx    Angioedema Neg Hx    Atopy Neg Hx    Eczema Neg Hx    Immunodeficiency Neg Hx    Urticaria Neg Hx    Social History   Social History Narrative   Not on file   Review of Systems  Constitutional:  Negative for fever and malaise/fatigue.  HENT:  Negative for congestion.   Eyes:  Negative for blurred vision.  Respiratory:  Negative for cough and shortness of breath.   Cardiovascular:  Negative for chest pain, palpitations and leg swelling.  Gastrointestinal:  Positive for nausea (while taking Abx). Negative for vomiting.  Musculoskeletal:  Negative for back pain.  Skin:  Negative for rash.  Neurological:  Negative for loss of consciousness and headaches.     Objective:  Vitals: BP 110/70   Pulse 76   Temp 98.7 F (37.1 C)   Ht 5' 1.5" (1.562 m)   Wt 149 lb (67.6 kg)   SpO2 97%   BMI 27.70 kg/m   Physical Exam Vitals and nursing note reviewed.  Constitutional:      General: She is not in acute distress.    Appearance: Normal appearance. She is not  toxic-appearing.  HENT:     Head: Normocephalic and atraumatic.  Pulmonary:     Effort: Pulmonary effort is normal.     Comments: Crackles on inhalation Skin:    General: Skin is warm and dry.  Neurological:     Mental Status: She is alert and oriented to person, place, and time. Mental status is at baseline.  Psychiatric:        Mood and Affect: Mood normal.        Behavior: Behavior normal.        Thought Content: Thought content normal.        Judgment: Judgment normal.     Results:  Studies Obtained And Personally Reviewed By Me:  CHEST - 2 VIEW 01/07/2023   COMPARISON:  06/08/2015   FINDINGS: Cardiac shadow is within normal limits. Tortuous thoracic aorta is noted with calcification. Lungs are well aerated bilaterally.  Patchy bibasilar airspace opacity is noted right greater than left consistent with multifocal pneumonia. Postsurgical changes in the left axilla are seen.   IMPRESSION: Bibasilar infiltrates right greater than left.    PORTABLE CHEST 1 VIEW 01/11/2023   COMPARISON:  01/07/2023   FINDINGS: Heart and mediastinal contours within normal limits. Aortic atherosclerosis. Layering bilateral pleural effusions. Worsening bilateral airspace disease, predominately in the lower lobes but also in the right upper lobe. No acute bony abnormality.   IMPRESSION: Worsening bilateral airspace disease, right greater than left concerning for pneumonia.    CHEST - 2 VIEW 01/31/2023   COMPARISON:  01/11/2023   FINDINGS: Stable heart size. Aortic atherosclerosis. Improved aeration of the lung fields compared to prior with mild residual interstitial opacities in the bilateral lung bases and within the periphery of the right upper lobe. No pleural effusion or pneumothorax. Scoliotic spinal curvature.   IMPRESSION: Improved aeration of the lung fields compared to prior with mild residual interstitial opacities in the bilateral lung bases and within the periphery of the right upper lobe.   Labs:     Component Value Date/Time   NA 142 01/31/2023 1221   K 4.5 01/31/2023 1221   CL 102 01/31/2023 1221   CO2 27 01/31/2023 1221   GLUCOSE 93 01/31/2023 1221   BUN 19 01/31/2023 1221   CREATININE 1.59 (H) 01/31/2023 1221   CALCIUM 10.3 01/31/2023 1221   PROT 6.3 01/31/2023 1221   ALBUMIN 2.2 (L) 01/12/2023 0549   AST 21 01/31/2023 1221   ALT 19 01/31/2023 1221   ALKPHOS 103 01/12/2023 0549   BILITOT 0.9 01/31/2023 1221   GFRNONAA 39 (L) 01/13/2023 0556   GFRNONAA 35 (L) 06/23/2020 1111   GFRAA 40 (L) 06/23/2020 1111    Lab Results  Component Value Date   WBC 4.7 01/31/2023   HGB 12.6 01/31/2023   HCT 39.3 01/31/2023   MCV 89.3 01/31/2023   PLT 342 01/31/2023   Lab Results  Component Value  Date   CHOL 176 06/27/2022   HDL 86 06/27/2022   LDLCALC 73 06/27/2022   TRIG 90 06/27/2022   CHOLHDL 2.0 06/27/2022   Lab Results  Component Value Date   TSH 0.39 (L) 02/10/2023   Assessment & Plan:  Pneumonia, 2nd Subsequent Encounter: today she informs [place] she is feeling much better. Some crackles noted on exam, may resolve or may be her new baseline. Repeat CXR scheduled for 02/24/2023. She expressed that she'd like to go to a funeral for a dear friend - discussed this and instructed that as long as she wears a mask,  doesn't stay too long, and doesn't come into close contact (such as hugs) then she should be fine to present there. May need CT imaging if CXR not clear.  Hyperthyroidism treated with 5 mg Methimazole, now taking 6 days/week. Followed by Terrace Arabia, MD, who she last saw 02/10/23, where her TSH was 0.39; Free T4 1.3; Free T3 3.7.    I,Emily Lagle,acting as a Neurosurgeon for Margaree Mackintosh, MD.,have documented all relevant documentation on the behalf of Margaree Mackintosh, MD,as directed by  Margaree Mackintosh, MD while in the presence of Margaree Mackintosh, MD.   I, Margaree Mackintosh, MD, have reviewed all documentation for this visit. The documentation on 03/03/23 for the exam, diagnosis, procedures, and orders are all accurate and complete.

## 2023-02-20 ENCOUNTER — Ambulatory Visit: Payer: Self-pay

## 2023-02-20 ENCOUNTER — Telehealth: Payer: Self-pay | Admitting: Internal Medicine

## 2023-02-20 DIAGNOSIS — J188 Other pneumonia, unspecified organism: Secondary | ICD-10-CM | POA: Diagnosis not present

## 2023-02-20 DIAGNOSIS — I1310 Hypertensive heart and chronic kidney disease without heart failure, with stage 1 through stage 4 chronic kidney disease, or unspecified chronic kidney disease: Secondary | ICD-10-CM | POA: Diagnosis not present

## 2023-02-20 DIAGNOSIS — N1832 Chronic kidney disease, stage 3b: Secondary | ICD-10-CM | POA: Diagnosis not present

## 2023-02-20 DIAGNOSIS — J9601 Acute respiratory failure with hypoxia: Secondary | ICD-10-CM | POA: Diagnosis not present

## 2023-02-20 DIAGNOSIS — D631 Anemia in chronic kidney disease: Secondary | ICD-10-CM | POA: Diagnosis not present

## 2023-02-20 DIAGNOSIS — M810 Age-related osteoporosis without current pathological fracture: Secondary | ICD-10-CM | POA: Diagnosis not present

## 2023-02-20 NOTE — Patient Outreach (Signed)
  Care Coordination   02/20/2023 Name: Sabrina English MRN: [id number] DOB: May 18, 1933   Care Coordination Outreach Attempts:  An unsuccessful outreach was attempted for an appointment today.  Follow Up Plan:  Additional outreach attempts will be made to offer the patient complex care management information and services.   Encounter Outcome:  No Answer   Care Coordination Interventions:  No, not indicated    Delsa Sale RN BSN CCM [place]  [facility] Nurse Care Coordinator  Direct Dial: [phone] Website: [email or web address]

## 2023-02-20 NOTE — Telephone Encounter (Signed)
Received a fax [phone], phone [phone] from [facility] that patients had some medication changes. Form needed to be signed and faxed back.

## 2023-02-20 NOTE — Telephone Encounter (Signed)
-------  Fax Transmission Report-------  To:               Recipient at [phone] Subject:          Fw: Hp Scans Result:           The transmission was successful. Explanation:      All Pages Ok Pages Sent:       3 Connect Time:     1 minutes, 20 seconds Transmit Time:    02/20/2023 09:42 Transfer Rate:    14400 Status Code:      0000 Retry Count:      0 Job Id:           [id number] Unique Id:        [id number] Fax Line:         13 Fax Server:       [employer]

## 2023-02-22 DIAGNOSIS — H353124 Nonexudative age-related macular degeneration, left eye, advanced atrophic with subfoveal involvement: Secondary | ICD-10-CM | POA: Diagnosis not present

## 2023-02-24 ENCOUNTER — Telehealth: Payer: Self-pay | Admitting: Internal Medicine

## 2023-02-24 ENCOUNTER — Encounter: Payer: Self-pay | Admitting: Physician Assistant

## 2023-02-24 ENCOUNTER — Ambulatory Visit
Admission: RE | Admit: 2023-02-24 | Discharge: 2023-02-24 | Disposition: A | Discharge status: Home / Self Care | Payer: Medicare Other | Source: Ambulatory Visit | Attending: Internal Medicine | Admitting: Internal Medicine

## 2023-02-24 ENCOUNTER — Ambulatory Visit: Payer: Medicare Other | Admitting: Physician Assistant

## 2023-02-24 VITALS — BP 134/76 | HR 72 | Ht 61.5 in | Wt 148.4 lb

## 2023-02-24 DIAGNOSIS — R142 Eructation: Secondary | ICD-10-CM

## 2023-02-24 DIAGNOSIS — J189 Pneumonia, unspecified organism: Secondary | ICD-10-CM | POA: Diagnosis not present

## 2023-02-24 DIAGNOSIS — J9601 Acute respiratory failure with hypoxia: Secondary | ICD-10-CM | POA: Diagnosis not present

## 2023-02-24 DIAGNOSIS — J851 Abscess of lung with pneumonia: Secondary | ICD-10-CM

## 2023-02-24 DIAGNOSIS — R0989 Other specified symptoms and signs involving the circulatory and respiratory systems: Secondary | ICD-10-CM

## 2023-02-24 DIAGNOSIS — Z09 Encounter for follow-up examination after completed treatment for conditions other than malignant neoplasm: Secondary | ICD-10-CM | POA: Diagnosis not present

## 2023-02-24 DIAGNOSIS — K581 Irritable bowel syndrome with constipation: Secondary | ICD-10-CM | POA: Diagnosis not present

## 2023-02-24 DIAGNOSIS — K219 Gastro-esophageal reflux disease without esophagitis: Secondary | ICD-10-CM

## 2023-02-24 MED ORDER — FAMOTIDINE 40 MG PO TABS: 40.0000 mg | ORAL_TABLET | Freq: Two times a day (BID) | ORAL | 5 refills | Status: DC

## 2023-02-24 NOTE — Progress Notes (Signed)
Chief Complaint: Follow up throat clearing  HPI:    Sabrina English is an 89 year old female with a past medical history as listed below including reflux and osteoporosis, known to Dr. Myrtie Neither, who returns to clinic today for throat clearing.     09/03/2015 patient seen in clinic by Dr. Myrtie Neither for heme positive stool.  At that time scheduled for an EGD with possible dilation and colonoscopy.    09/24/2015 colonoscopy with diverticulosis in the sigmoid colon otherwise normal.  EGD with tortuous esophagus, small hiatal hernia normal and mild Schatzki's ring.    04/26/2021 CT of the abdomen pelvis with contrast for left lower quadrant pain with cortical atrophy and scarring of the left kidney some subsegmental atelectasis in the left lung base and no acute intra-abdominal or intrapelvic abnormalities.    04/26/2021 CMP with creatinine 1.36 and BUN 30 and otherwise normal.  CBC normal.    06/02/2021 patient seen in clinic with her daughter and describes 7-8 weeks of abdominal pain which is initially periumbilical and moved to the left lower quadrant and settled back in her periumbilical area.  Described episodes of constipation over that time.  As well as a lot of belching.  We discussed recent work-up including imaging and labs and start a fiber supplement as well as MiraLAX daily.  Also encouraged water intake and refilled her Dicyclomine to use 20 mg every 4-6 hours as needed for pain.  Also increase Famotidine to twice daily.  Dr. Myrtie Neither commented that he thought her abdominal pain is due to chronic constipation and left-sided diverticulosis.    07/28/2021 patient described occasional diarrhea continued on MiraLAX and Benefiber.  Was using Dicyclomine 20 twice daily and felt some better.  At that time given a copy of the low FODMAP diet.  Continued Dicyclomine, Benefiber and MiraLAX.  Discussed the possibility of musculoskeletal etiology for pain.    06/27/2022 CMP with a creatinine of 1.44, BUN elevated 28.   CBC normal.  TSH normal.    09/12/2022 patient's daughter called and describes left abdominal pain up under her breast.  Bentyl refill was approved and patient had restarted with gas medicine.  Also discussed restarting MiraLAX and Benefiber.    11/24/2022 patient seen in clinic with her daughter and they explained that back in August she had an acute episode of epigastric/left upper quadrant pain and was off of her Dicyclomine.  Taking fiber every other day.  Biggest complaint at that time was eructations and some throat clearing.  She had been only using Famotidine 20 mg once daily in the morning instead of her usual 40 mg twice a day.  At that time noted IBS-C did well with Benefiber and MiraLAX with generalized abdominal pain cleared up with Dicyclomine.  We discussed taking Dicyclomine at least 20 mg once a day up to twice a day as needed as well as Benefiber more regularly.  Also increased her back to 40 mg twice daily of Famotidine.  Refill Dicyclomine.    Today, the patient presents to clinic accompanied by daughter who assists with history.  Apparently the throat clearing is much better, they had noticed that she was only taking Famotidine 20 mg once daily in the morning and so they increased it to 20 mg twice daily and that seems to take care of the throat clearing but she still "burps like a sailor".  Tells me she has a lot of increased gas and eructations.  She denies any acidic taste in her mouth or  abdominal pain.  Her IBS-C is currently under control with Dicyclomine and fiber.    Denies fever, chills or weight loss.  Past Medical History:  Diagnosis Date   Cancer (HCC)    melanoma l shoulder   GERD (gastroesophageal reflux disease)    Hyperlipidemia    Macular degeneration    Melanoma (HCC)    Meniere's disease    Osteoporosis    osteopenia   Thyroid disease     Past Surgical History:  Procedure Laterality Date   AXILLARY NODE DISSECTION  1983   BUNIONECTOMY     CATARACT  Bilateral    MELANOMA EXCISION     left shoulder    Current Outpatient Medications  Medication Sig Dispense Refill   calcium-vitamin D (OSCAL WITH D) 500-200 MG-UNIT per tablet Take 1 tablet by mouth daily.     cholecalciferol (VITAMIN D3) 25 MCG (1000 UNIT) tablet Take 1,000 Units by mouth daily.     cyclobenzaprine (FLEXERIL) 10 MG tablet Take 1 tablet (10 mg total) by mouth at bedtime. (Patient taking differently: Take 10 mg by mouth daily as needed for muscle spasms.) 30 tablet 1   dicyclomine (BENTYL) 20 MG tablet Take 1 tablet (20 mg total) by mouth daily. 90 tablet 3   doxycycline (VIBRA-TABS) 100 MG tablet Take 1 tablet (100 mg total) by mouth 2 (two) times daily. 14 tablet 0   famotidine (PEPCID) 20 MG tablet Take 1 tablet (20 mg total) by mouth 2 (two) times daily. 180 tablet 3   ferrous sulfate 325 (65 FE) MG EC tablet Take 325 mg by mouth daily with breakfast.     ipratropium (ATROVENT) 0.06 % nasal spray USE 2 SPRAYS IN BOTH NOSTRILS  EVERY 6 HOURS IF NEEDED TO DRY  UP NOSE (Patient taking differently: Place 1 spray into the nose in the morning and at bedtime.) 135 mL 3   latanoprost (XALATAN) 0.005 % ophthalmic solution Place 1 drop into both eyes at bedtime.     meclizine (ANTIVERT) 25 MG tablet Take 1 tablet (25 mg total) by mouth 3 (three) times daily as needed. (Patient taking differently: Take 25 mg by mouth 3 (three) times daily as needed for nausea.) 30 tablet 11   methimazole (TAPAZOLE) 5 MG tablet Take 1 tablet (5 mg total) by mouth as directed. 1 tablet Monday through Saturday and none on Sundays 78 tablet 3   mometasone-formoterol (DULERA) 100-5 MCG/ACT AERO Inhale 2 puffs into the lungs 2 (two) times daily. 1 each 0   Multiple Vitamins-Minerals (PRESERVISION AREDS PO) Take 1 tablet by mouth in the morning and at bedtime.     ondansetron (ZOFRAN-ODT) 4 MG disintegrating tablet Take 4 mg by mouth every 6 (six) hours as needed.     PRESCRIPTION MEDICATION Take 8 mg by  mouth daily. Betahistine Dihydrochloride 8 mg     PRESCRIPTION MEDICATION Place 1 spray into the nose daily. Unknown nasal spray     triamterene-hydrochlorothiazide (DYAZIDE) 37.5-25 MG capsule TAKE 1 CAPSULE BY MOUTH EVERY DAY 90 capsule 3   valACYclovir (VALTREX) 1000 MG tablet Take 2,000 mg by mouth 2 (two) times daily.     Vitamins-Lipotropics (LIPOFLAVONOID PO) Take 1 tablet by mouth 2 (two) times daily.     No current facility-administered medications for this visit.    Allergies as of 02/24/2023 - Review Complete 02/16/2023  Allergen Reaction Noted   Levaquin [levofloxacin in d5w] Other (See Comments) 12/09/2016    Family History  Problem Relation Age of Onset  Mental illness Mother    Hyperlipidemia Mother    Cancer Father    Asthma Father    Cancer Sister    Hypothyroidism Brother    Allergic rhinitis Neg Hx    Angioedema Neg Hx    Atopy Neg Hx    Eczema Neg Hx    Immunodeficiency Neg Hx    Urticaria Neg Hx     Social History   Socioeconomic History   Marital status: Widowed    Spouse name: Not on file   Number of children: 3   Years of education: Not on file   Highest education level: Not on file  Occupational History   Occupation: retired  Tobacco Use   Smoking status: Never   Smokeless tobacco: Never  Vaping Use   Vaping status: Never Used  Substance and Sexual Activity   Alcohol use: No    Alcohol/week: 0.0 standard drinks of alcohol   Drug use: No   Sexual activity: Not Currently  Other Topics Concern   Not on file  Social History Narrative   Not on file   Social Drivers of Health   Financial Resource Strain: Low Risk  (06/18/2018)   Overall Financial Resource Strain (CARDIA)    Difficulty of Paying Living Expenses: Not hard at all  Food Insecurity: No Food Insecurity (02/06/2023)   Hunger Vital Sign    Worried About Running Out of Food in the Last Year: Never true    Ran Out of Food in the Last Year: Never true  Transportation Needs: No  Transportation Needs (02/06/2023)   PRAPARE - Administrator, Civil Service (Medical): No    Lack of Transportation (Non-Medical): No  Physical Activity: Insufficiently Active (06/18/2018)   Exercise Vital Sign    Days of Exercise per Week: 3 days    Minutes of Exercise per Session: 30 min  Stress: No Stress Concern Present (06/18/2018)   [employer] of Occupational Health - Occupational Stress Questionnaire    Feeling of Stress : Not at all  Social Connections: Moderately Isolated (01/08/2023)   Social Connection and Isolation Panel [NHANES]    Frequency of Communication with Friends and Family: Once a week    Frequency of Social Gatherings with Friends and Family: Once a week    Attends Religious Services: 1 to 4 times per year    Active Member of [employer] or Organizations: Yes    Attends Banker Meetings: 1 to 4 times per year    Marital Status: Widowed  Intimate Partner Violence: Patient Unable To Answer (02/06/2023)   Humiliation, Afraid, Rape, and Kick questionnaire    Fear of Current or Ex-Partner: Patient unable to answer    Emotionally Abused: Patient unable to answer    Physically Abused: Patient unable to answer    Sexually Abused: Patient unable to answer    Review of Systems:    Constitutional: No weight loss, fever or chills Cardiovascular: No chest pain Respiratory: No SOB Gastrointestinal: See HPI and otherwise negative   Physical Exam:  Vital signs: BP 134/76   Pulse 72   Ht 5' 1.5" (1.562 m)   Wt 148 lb 6 oz (67.3 kg)   SpO2 96%   BMI 27.58 kg/m    Constitutional:   Pleasant Elderly Caucasian female appears to be in NAD, Well developed, Well nourished, alert and cooperative Respiratory: Respirations even and unlabored. Lungs clear to auscultation bilaterally.   No wheezes, crackles, or rhonchi.  Cardiovascular: Normal S1, S2. No  MRG. Regular rate and rhythm. No peripheral edema, cyanosis or pallor.  Gastrointestinal:  Soft,  nondistended, nontender. No rebound or guarding. Normal bowel sounds. No appreciable masses or hepatomegaly. Rectal:  Not performed.  Psychiatric: Demonstrates good judgement and reason without abnormal affect or behaviors.  RELEVANT LABS AND IMAGING: CBC    Component Value Date/Time   WBC 4.7 01/31/2023 1221   RBC 4.40 01/31/2023 1221   HGB 12.6 01/31/2023 1221   HCT 39.3 01/31/2023 1221   PLT 342 01/31/2023 1221   MCV 89.3 01/31/2023 1221   MCV 84.8 05/07/2014 1115   MCH 28.6 01/31/2023 1221   MCHC 32.1 01/31/2023 1221   RDW 12.2 01/31/2023 1221   LYMPHSABS 0.8 01/13/2023 0556   MONOABS 0.3 01/13/2023 0556   EOSABS 179 01/31/2023 1221   BASOSABS 28 01/31/2023 1221    CMP     Component Value Date/Time   NA 142 01/31/2023 1221   K 4.5 01/31/2023 1221   CL 102 01/31/2023 1221   CO2 27 01/31/2023 1221   GLUCOSE 93 01/31/2023 1221   BUN 19 01/31/2023 1221   CREATININE 1.59 (H) 01/31/2023 1221   CALCIUM 10.3 01/31/2023 1221   PROT 6.3 01/31/2023 1221   ALBUMIN 2.2 (L) 01/12/2023 0549   AST 21 01/31/2023 1221   ALT 19 01/31/2023 1221   ALKPHOS 103 01/12/2023 0549   BILITOT 0.9 01/31/2023 1221   GFRNONAA 39 (L) 01/13/2023 0556   GFRNONAA 35 (L) 06/23/2020 1111   GFRAA 40 (L) 06/23/2020 1111    Assessment: 1.  Eructations: Still continuing, does not drink many caffeinated beverages or drink from straws or chew gum; likely related to reflux 2.  Throat clearing: Better with increase of Famotidine to 20 mg twice daily; likely reflux related 3.  IBS-C: Doing well on as needed fiber and Dicyclomine  Plan: 1.  We will increase Famotidine to 40 mg twice daily as we attempted to do at last visit.  Prescribed #60 with 5 refills. 2.  Continue fiber and dicyclomine for IBS-C.  Currently working well for her. 3.  Patient to follow in clinic with me in 3 to 4 months or sooner if necessary.  Hyacinth Meeker, PA-C  Gastroenterology 02/24/2023, 9:52 AM  Cc: Margaree Mackintosh,  MD

## 2023-02-24 NOTE — Patient Instructions (Signed)
We have sent the following medications to your pharmacy for you to pick up at your convenience:   Famotidine 40mg  once at  breakfast and once at bedtime.  _______________________________________________________  If your blood pressure at your visit was 140/90 or greater, please contact your primary care physician to follow up on this.  _______________________________________________________  If you are age 88 or older, your body mass index should be between 23-30. Your Body mass index is 27.58 kg/m. If this is out of the aforementioned range listed, please consider follow up with your Primary Care Provider.  If you are age 46 or younger, your body mass index should be between 19-25. Your Body mass index is 27.58 kg/m. If this is out of the aformentioned range listed, please consider follow up with your Primary Care Provider.   ________________________________________________________  The [facility] providers would like to encourage you to use [facility] to communicate with providers for non-urgent requests or questions.  Due to long hold times on the telephone, sending your provider a message by [facility] may be a faster and more efficient way to get a response.  Please allow 48 business hours for a response.  Please remember that this is for non-urgent requests.  _______________________________________________________  Thank you for trusting me with your gastrointestinal care!   Jennifer L. Groesbeck, [place]

## 2023-02-24 NOTE — Telephone Encounter (Signed)
Have reviewed CXR and results per Radiology. CXR still showing  ? Right lower lobe scarring or atelectasis. Did not have Chest CT in hospital when recently admitted. Will order CXR.   Will not order contrast due to chronic kidney disease.  Could pt has lung abscess/ tumor or persistent infiltrate?  Have discussed this by phone with daughter today. Wednesdays are best days for scheduling per daughter. MJB

## 2023-02-25 DIAGNOSIS — F419 Anxiety disorder, unspecified: Secondary | ICD-10-CM | POA: Diagnosis not present

## 2023-02-25 DIAGNOSIS — D631 Anemia in chronic kidney disease: Secondary | ICD-10-CM | POA: Diagnosis not present

## 2023-02-25 DIAGNOSIS — Z602 Problems related to living alone: Secondary | ICD-10-CM | POA: Diagnosis not present

## 2023-02-25 DIAGNOSIS — I1310 Hypertensive heart and chronic kidney disease without heart failure, with stage 1 through stage 4 chronic kidney disease, or unspecified chronic kidney disease: Secondary | ICD-10-CM | POA: Diagnosis not present

## 2023-02-25 DIAGNOSIS — N1832 Chronic kidney disease, stage 3b: Secondary | ICD-10-CM | POA: Diagnosis not present

## 2023-02-25 DIAGNOSIS — J9601 Acute respiratory failure with hypoxia: Secondary | ICD-10-CM | POA: Diagnosis not present

## 2023-02-25 DIAGNOSIS — J188 Other pneumonia, unspecified organism: Secondary | ICD-10-CM | POA: Diagnosis not present

## 2023-02-25 DIAGNOSIS — Z85820 Personal history of malignant melanoma of skin: Secondary | ICD-10-CM | POA: Diagnosis not present

## 2023-02-25 DIAGNOSIS — I080 Rheumatic disorders of both mitral and aortic valves: Secondary | ICD-10-CM | POA: Diagnosis not present

## 2023-02-25 DIAGNOSIS — M810 Age-related osteoporosis without current pathological fracture: Secondary | ICD-10-CM | POA: Diagnosis not present

## 2023-02-27 NOTE — Progress Notes (Signed)
 ____________________________________________________________  Attending physician addendum:  Thank you for sending this case to me. I have reviewed the entire note and agree with the plan.  I suspect she has supragastric belching with aerophagia.  Amada Jupiter, MD  ____________________________________________________________

## 2023-02-28 DIAGNOSIS — D631 Anemia in chronic kidney disease: Secondary | ICD-10-CM | POA: Diagnosis not present

## 2023-02-28 DIAGNOSIS — J9601 Acute respiratory failure with hypoxia: Secondary | ICD-10-CM | POA: Diagnosis not present

## 2023-02-28 DIAGNOSIS — I1310 Hypertensive heart and chronic kidney disease without heart failure, with stage 1 through stage 4 chronic kidney disease, or unspecified chronic kidney disease: Secondary | ICD-10-CM | POA: Diagnosis not present

## 2023-02-28 DIAGNOSIS — M810 Age-related osteoporosis without current pathological fracture: Secondary | ICD-10-CM | POA: Diagnosis not present

## 2023-02-28 DIAGNOSIS — J188 Other pneumonia, unspecified organism: Secondary | ICD-10-CM | POA: Diagnosis not present

## 2023-02-28 DIAGNOSIS — N1832 Chronic kidney disease, stage 3b: Secondary | ICD-10-CM | POA: Diagnosis not present

## 2023-03-02 ENCOUNTER — Telehealth: Payer: Self-pay | Admitting: Internal Medicine

## 2023-03-02 NOTE — Telephone Encounter (Signed)
 error

## 2023-03-02 NOTE — Telephone Encounter (Signed)
 Received faxed orders for medication change from [facility] of a medication change   Order # [id number]

## 2023-03-02 NOTE — Telephone Encounter (Signed)
-------  Fax Transmission Report-------  To:               Recipient at [phone] Subject:          Fw: Hp Scans Result:           The transmission was successful. Explanation:      All Pages Ok Pages Sent:       3 Connect Time:     1 minutes, 15 seconds Transmit Time:    03/02/2023 11:34 Transfer Rate:    14400 Status Code:      0000 Retry Count:      0 Job Id:           [id number] Unique Id:        [id number] Fax Line:         13 Fax Server:       [employer]

## 2023-03-03 NOTE — Patient Instructions (Addendum)
 CXR is still not clear. Schedule CT of chest for further assessment of lungs.

## 2023-03-06 ENCOUNTER — Other Ambulatory Visit: Payer: Self-pay

## 2023-03-06 MED ORDER — TRIAMTERENE-HCTZ 37.5-25 MG PO CAPS: ORAL_CAPSULE | ORAL | 3 refills | Status: DC

## 2023-03-07 DIAGNOSIS — M810 Age-related osteoporosis without current pathological fracture: Secondary | ICD-10-CM | POA: Diagnosis not present

## 2023-03-07 DIAGNOSIS — N1832 Chronic kidney disease, stage 3b: Secondary | ICD-10-CM | POA: Diagnosis not present

## 2023-03-07 DIAGNOSIS — D631 Anemia in chronic kidney disease: Secondary | ICD-10-CM | POA: Diagnosis not present

## 2023-03-07 DIAGNOSIS — I1310 Hypertensive heart and chronic kidney disease without heart failure, with stage 1 through stage 4 chronic kidney disease, or unspecified chronic kidney disease: Secondary | ICD-10-CM | POA: Diagnosis not present

## 2023-03-07 DIAGNOSIS — J188 Other pneumonia, unspecified organism: Secondary | ICD-10-CM | POA: Diagnosis not present

## 2023-03-07 DIAGNOSIS — J9601 Acute respiratory failure with hypoxia: Secondary | ICD-10-CM | POA: Diagnosis not present

## 2023-03-14 ENCOUNTER — Ambulatory Visit: Payer: Self-pay | Admitting: Internal Medicine

## 2023-03-14 ENCOUNTER — Ambulatory Visit (INDEPENDENT_AMBULATORY_CARE_PROVIDER_SITE_OTHER): Admitting: Internal Medicine

## 2023-03-14 DIAGNOSIS — M8580 Other specified disorders of bone density and structure, unspecified site: Secondary | ICD-10-CM | POA: Diagnosis not present

## 2023-03-14 DIAGNOSIS — Z8719 Personal history of other diseases of the digestive system: Secondary | ICD-10-CM | POA: Diagnosis not present

## 2023-03-14 DIAGNOSIS — Z1329 Encounter for screening for other suspected endocrine disorder: Secondary | ICD-10-CM

## 2023-03-14 DIAGNOSIS — J189 Pneumonia, unspecified organism: Secondary | ICD-10-CM

## 2023-03-14 DIAGNOSIS — E0590 Thyrotoxicosis, unspecified without thyrotoxic crisis or storm: Secondary | ICD-10-CM

## 2023-03-14 DIAGNOSIS — R053 Chronic cough: Secondary | ICD-10-CM | POA: Diagnosis not present

## 2023-03-14 LAB — POC COVID19/FLU A&B COMBO
Covid Antigen, POC: NEGATIVE
Influenza A Antigen, POC: NEGATIVE
Influenza B Antigen, POC: NEGATIVE

## 2023-03-14 MED ORDER — CEFTRIAXONE SODIUM 1 G IJ SOLR
1.0000 g | Freq: Once | INTRAMUSCULAR | Status: AC
  Administered 2023-03-14: 1 g via INTRAMUSCULAR

## 2023-03-14 MED ORDER — BENZONATATE 100 MG PO CAPS
100.0000 mg | ORAL_CAPSULE | Freq: Three times a day (TID) | ORAL | 0 refills | Status: DC | PRN
Start: 2023-03-14 — End: 2023-07-28

## 2023-03-14 NOTE — Progress Notes (Signed)
 Patient Care Team: Margaree Mackintosh, MD as PCP - General (Internal Medicine)  Visit Date: 03/14/23  Subjective:   Chief Complaint  Patient presents with   Cough   BP Readings from Last 1 Encounters:  02/24/23 134/76   Patient ZO:XWRU L Bohr,Female DOB:May 23, 1933,89 y.o. EAV:[id number]   89 y.o. Female, here with her daughter, presents today for acute sick visit.  Patient reports recent onset of cough with clear sputum production.  History of multifocal PNA requiring admission in 01/2023 with bibasilar infiltrates right greater than left.  Of lung field compared to previous views with mild residual interstitial opacities in bilateral lung bases and within periphery of right upper lobe.  At that time was treated with doxycycline 100 mg twice daily for 7 days.  Followed up on February 13 here in the office and reported that doxycycline likely cause nausea.  Still had some crackles on exam.  Had hospital follow-up here January 28 (see note) and chest x-ray showed improved aeration.  She is scheduled for chest CT Scan 3/19. Says that besides going out one day for a hair cut, she has stayed in her house or only visited her daughter. Started using Mucinex yesterday to try and clear chest congestion. Denies sore throat.  Is coughing today and has productive cough of white sputum.  History of Hyperthyroidism managed with Tapazole 5 mg, was taking 0.5 of a tablet, increased to 1 full tablet daily, except on Sunday in response to 02/10/2023 TSH 0.39. Past Medical History:  Diagnosis Date   Cancer (HCC)    melanoma l shoulder   GERD (gastroesophageal reflux disease)    Hyperlipidemia    Macular degeneration    Melanoma (HCC)    Meniere's disease    Osteoporosis    osteopenia   Thyroid disease     Allergies  Allergen Reactions   Levaquin [Levofloxacin In D5w] Other (See Comments)    Lower extremity pain     Family History  Problem Relation Age of Onset   Mental illness Mother     Hyperlipidemia Mother    Cancer Father    Asthma Father    Cancer Sister    Hypothyroidism Brother    Allergic rhinitis Neg Hx    Angioedema Neg Hx    Atopy Neg Hx    Eczema Neg Hx    Immunodeficiency Neg Hx    Urticaria Neg Hx    Colon cancer Neg Hx    Esophageal cancer Neg Hx    Pancreatic cancer Neg Hx    Stomach cancer Neg Hx    Social History   Social History Narrative   Not on file   Review of Systems  HENT:  Positive for congestion (chest).   Respiratory:  Positive for cough and sputum production (clear).      Objective:  Vitals: There were no vitals taken for this visit.  Physical Exam Vitals and nursing note reviewed.  Constitutional:      General: She is not in acute distress.    Appearance: Normal appearance. She is not ill-appearing.  HENT:     Head: Normocephalic and atraumatic.     Right Ear: Tympanic membrane, ear canal and external ear normal.     Left Ear: Tympanic membrane, ear canal and external ear normal.     Mouth/Throat:     Mouth: Mucous membranes are moist.     Pharynx: Oropharynx is clear. No oropharyngeal exudate or posterior oropharyngeal erythema.  Pulmonary:     Effort:  Pulmonary effort is normal.     Breath sounds: Examination of the right-lower field reveals rales. Examination of the left-lower field reveals rales. Rales (expiratory) present. No wheezing or rhonchi.  Lymphadenopathy:     Cervical: No cervical adenopathy.  Skin:    General: Skin is warm and dry.  Neurological:     Mental Status: She is alert and oriented to person, place, and time. Mental status is at baseline.  Psychiatric:        Mood and Affect: Mood normal.        Behavior: Behavior normal.        Thought Content: Thought content normal.        Judgment: Judgment normal.     Results:  Studies Obtained And Personally Reviewed By Me: Labs:     Component Value Date/Time   NA 142 01/31/2023 1221   K 4.5 01/31/2023 1221   CL 102 01/31/2023 1221   CO2 27  01/31/2023 1221   GLUCOSE 93 01/31/2023 1221   BUN 19 01/31/2023 1221   CREATININE 1.59 (H) 01/31/2023 1221   CALCIUM 10.3 01/31/2023 1221   PROT 6.3 01/31/2023 1221   ALBUMIN 2.2 (L) 01/12/2023 0549   AST 21 01/31/2023 1221   ALT 19 01/31/2023 1221   ALKPHOS 103 01/12/2023 0549   BILITOT 0.9 01/31/2023 1221   GFRNONAA 39 (L) 01/13/2023 0556   GFRNONAA 35 (L) 06/23/2020 1111   GFRAA 40 (L) 06/23/2020 1111    Lab Results  Component Value Date   WBC 4.7 01/31/2023   HGB 12.6 01/31/2023   HCT 39.3 01/31/2023   MCV 89.3 01/31/2023   PLT 342 01/31/2023   Lab Results  Component Value Date   CHOL 176 06/27/2022   HDL 86 06/27/2022   LDLCALC 73 06/27/2022   TRIG 90 06/27/2022   CHOLHDL 2.0 06/27/2022    Lab Results  Component Value Date   TSH 0.39 (L) 02/10/2023    Assessment & Plan:   Orders Placed This Encounter  Procedures   CBC with Differential/Platelet   COMPLETE METABOLIC PANEL WITH GFR   TSH   Sedimentation rate   Productive cough: expiratory crackles/rales lower lobes bilaterally.  Wonder if she has an element of COPD but has never been a smoker.  Consider Sarcoidosis.  Unlikely to have CHF.  Today given 1 g Rocephin IM today.  Also sending in 100 mg Tessalon Perles -  take 1 capsule (100 mg total) by mouth 3 (three) times daily as needed for cough.  Walk around some to prevent atelectasis. Contact us if symptoms worsen/persist despite treatment. Chest CT scheduled for 3/19.  Have requested Pulmonary consultation.  Will check on status of referral.  Daughter prefers checking on chest CT before prescribing more antibiotics.  Does not have JVD.  Hyperthyroidism managed with Tapazole 5 mg, was taking 0.5 of a tablet, increased to 1 full tablet daily, except on Sunday in response to 02/10/2023 TSH 0.39.  Followed by Endocrinology  Osteopenia, Bone Density overdue since 2024, T-score -1.8 in 2022.   I,Emily Lagle,acting as a Neurosurgeon for Margaree Mackintosh, MD.,have documented  all relevant documentation on the behalf of Margaree Mackintosh, MD,as directed by  Margaree Mackintosh, MD while in the presence of Margaree Mackintosh, MD.   I, Margaree Mackintosh, MD, have reviewed all documentation for this visit. The documentation on 03/15/23 for the exam, diagnosis, procedures, and orders are all accurate and complete.

## 2023-03-14 NOTE — Telephone Encounter (Signed)
 Information obtained from daughter sharon, please call sharon back if you can get earlier appt with [phone].   Chief Complaint: cough Symptoms: productive cough Frequency: 3 days, never really went away since dec Pertinent Negatives: Patient denies fever, SOB when not coughing, hemoptysis Disposition: [] ED /[x] Urgent Care (no appt availability in office) / [] Appointment(In office/virtual)/ []  [facility]/ [] Home Care/ [x] Refused Recommended Disposition /[]  Mobile Bus/ []  Follow-up with PCP  Additional Notes: Pts daughter calling stating that pt has been having ongoing cough/pneumonia. Daughter stated that her cough started 3 days ago and was dry. Pt now coughing up clear/white phlegm. Pt denies fever. Daughter states pt is SOB with cough. Daughter states cough is severe. No appts today nor tomorrow at PCP office. Advised UC. Daughter Jasmine December would like to know if PCP would make exception and get pt in sooner. Unclear if daughter will take pt to UC. Pt was not with caller during time of call.   Copied from CRM [id number]. Topic: Clinical - Red Word Triage >> Mar 14, 2023  1:46 PM Nyra Capes wrote: Red Word that prompted transfer to Nurse Triage: Patient daughter Jasmine December called in. Patient was in hospital for pneumonia in January. Patient started coughing on Saturday, and it had gotten worse, and is coughing up stuff. Reason for Disposition . [1] Continuous (nonstop) coughing interferes with work or school AND [2] no improvement using cough treatment per Care Advice  Answer Assessment - Initial Assessment Questions 1. ONSET: "When did the cough begin?"      3 days ago 2. SEVERITY: "How bad is the cough today?"      severe 3. SPUTUM: "Describe the color of your sputum" (none, dry cough; clear, white, yellow, green)     Clear/ white, thick,  4. HEMOPTYSIS: "Are you coughing up any blood?" If so ask: "How much?" (flecks, streaks, tablespoons, etc.)     denies 5.  DIFFICULTY BREATHING: "Are you having difficulty breathing?" If Yes, ask: "How bad is it?" (e.g., mild, moderate, severe)    - MILD: No SOB at rest, mild SOB with walking, speaks normally in sentences, can lie down, no retractions, pulse < 100.    - MODERATE: SOB at rest, SOB with minimal exertion and prefers to sit, cannot lie down flat, speaks in phrases, mild retractions, audible wheezing, pulse 100-120.    - SEVERE: Very SOB at rest, speaks in single words, struggling to breathe, sitting hunched forward, retractions, pulse > 120      Sob only when she's coughing 6. FEVER: "Do you have a fever?" If Yes, ask: "What is your temperature, how was it measured, and when did it start?"     Not that daughter is aware of  7. CARDIAC HISTORY: "Do you have any history of heart disease?" (e.g., heart attack, congestive heart failure)      denies 8. LUNG HISTORY: "Do you have any history of lung disease?"  (e.g., pulmonary embolus, asthma, emphysema)     Pneumonia, 9. PE RISK FACTORS: "Do you have a history of blood clots?" (or: recent major surgery, recent prolonged travel, bedridden)     Daughter unsure,  10. OTHER SYMPTOMS: "Do you have any other symptoms?" (e.g., runny nose, wheezing, chest pain)       Runny nose 12. TRAVEL: "Have you traveled out of the country in the last month?" (e.g., travel history, exposures)       denies  Protocols used: Cough - Acute Productive-A-AH

## 2023-03-15 ENCOUNTER — Encounter: Payer: Self-pay | Admitting: Internal Medicine

## 2023-03-15 LAB — CBC WITH DIFFERENTIAL/PLATELET
Absolute Lymphocytes: 1564 {cells}/uL (ref 850–3900)
Absolute Monocytes: 632 {cells}/uL (ref 200–950)
Basophils Absolute: 28 {cells}/uL (ref 0–200)
Basophils Relative: 0.7 %
Eosinophils Absolute: 68 {cells}/uL (ref 15–500)
Eosinophils Relative: 1.7 %
HCT: 36 % (ref 35.0–45.0)
Hemoglobin: 11.9 g/dL (ref 11.7–15.5)
MCH: 29.4 pg (ref 27.0–33.0)
MCHC: 33.1 g/dL (ref 32.0–36.0)
MCV: 88.9 fL (ref 80.0–100.0)
MPV: 10 fL (ref 7.5–12.5)
Monocytes Relative: 15.8 %
Neutro Abs: 1708 {cells}/uL (ref 1500–7800)
Neutrophils Relative %: 42.7 %
Platelets: 255 10*3/uL (ref 140–400)
RBC: 4.05 10*6/uL (ref 3.80–5.10)
RDW: 12.9 % (ref 11.0–15.0)
Total Lymphocyte: 39.1 %
WBC: 4 10*3/uL (ref 3.8–10.8)

## 2023-03-15 LAB — COMPLETE METABOLIC PANEL WITH GFR
AG Ratio: 1.9 (calc) (ref 1.0–2.5)
ALT: 25 U/L (ref 6–29)
AST: 27 U/L (ref 10–35)
Albumin: 4 g/dL (ref 3.6–5.1)
Alkaline phosphatase (APISO): 96 U/L (ref 37–153)
BUN/Creatinine Ratio: 15 (calc) (ref 6–22)
BUN: 21 mg/dL (ref 7–25)
CO2: 24 mmol/L (ref 20–32)
Calcium: 9.6 mg/dL (ref 8.6–10.4)
Chloride: 106 mmol/L (ref 98–110)
Creat: 1.42 mg/dL — ABNORMAL HIGH (ref 0.60–0.95)
Globulin: 2.1 g/dL (ref 1.9–3.7)
Glucose, Bld: 108 mg/dL — ABNORMAL HIGH (ref 65–99)
Potassium: 3.7 mmol/L (ref 3.5–5.3)
Sodium: 141 mmol/L (ref 135–146)
Total Bilirubin: 0.5 mg/dL (ref 0.2–1.2)
Total Protein: 6.1 g/dL (ref 6.1–8.1)
eGFR: 36 mL/min/{1.73_m2} — ABNORMAL LOW (ref >=60)

## 2023-03-15 LAB — SEDIMENTATION RATE: Sed Rate: 14 mm/h (ref 0–30)

## 2023-03-15 LAB — TSH: TSH: 1.94 m[IU]/L (ref 0.40–4.50)

## 2023-03-15 NOTE — Patient Instructions (Addendum)
 Patient has had recurrence of cough despite recent admission for multifocal pneumonia.  Given 1 g IM Rocephin today and started on Tessalon Perles.  Is to have chest CT in the very near future and Pulmonary consultation.  Has history of hyperthyroidism and recently saw endocrinologist and Tapazole dose was increased.  She is a non-smoker but presentation is consistent with COPD.  Does not want an inhaler.  He is not short of breath at rest.  Has tested negative for COVID and flu.  CBC, CMET TSH sed rate drawn and pending.

## 2023-03-16 ENCOUNTER — Ambulatory Visit: Admitting: Internal Medicine

## 2023-03-22 ENCOUNTER — Ambulatory Visit
Admission: RE | Admit: 2023-03-22 | Discharge: 2023-03-22 | Disposition: A | Discharge status: Home / Self Care | Payer: Medicare Other | Source: Ambulatory Visit | Attending: Internal Medicine | Admitting: Internal Medicine

## 2023-03-22 DIAGNOSIS — R918 Other nonspecific abnormal finding of lung field: Secondary | ICD-10-CM | POA: Diagnosis not present

## 2023-03-29 DIAGNOSIS — N1832 Chronic kidney disease, stage 3b: Secondary | ICD-10-CM | POA: Diagnosis not present

## 2023-03-30 NOTE — Progress Notes (Signed)
 Referral placed.

## 2023-04-03 ENCOUNTER — Encounter: Payer: Self-pay | Admitting: Pulmonary Disease

## 2023-04-03 ENCOUNTER — Ambulatory Visit (INDEPENDENT_AMBULATORY_CARE_PROVIDER_SITE_OTHER): Admitting: Pulmonary Disease

## 2023-04-03 VITALS — BP 133/73 | HR 68 | Ht 63.0 in | Wt 147.0 lb

## 2023-04-03 DIAGNOSIS — J189 Pneumonia, unspecified organism: Secondary | ICD-10-CM

## 2023-04-03 NOTE — Progress Notes (Signed)
 @Patient  ID: Sabrina English, female    DOB: Sep 03, 1933, 89 y.o.   MRN: [id number]  Chief Complaint  Patient presents with   Consult    Pt states new ct show something in LT lung    Referring provider: Margaree Mackintosh, MD  HPI:   89 y.o. woman whom we are seeing for evaluation of abnormal chest imaging with recent diagnosis of pneumonia.  Multiple PCP notes reviewed.  Patient came acutely ill 01/2023.  She was admitted to the hospital.  Chest x-ray revealed bilateral infiltrates most prominent right lower lung fields and left perihilar area on my review interpretation.  She was given antibiotics.  Repeat chest x-ray 20 days later showed improving infiltrates.  Repeat x-ray 1 month after that 02/2023 showed on my review interpretation resolved infiltrates.  There is a left peripheral lung opacity that is unchanged to my eye compared to most recent chest x-ray prior to this series of events in 2017.  This infiltrate prompted the CT scan.  It showed faint scattered groundglass opacities on the right and area of focal bronchiectasis/scarring peripherally adjacent to the heart border on the left.  This is unchanged compared to 2016.  This likely accounts for the infiltrate on chest x-ray seen in 2017 that clearly persists currently.  She has had some lingering cough but this is improving.  Seems to wax and wane.  Questionaires / Pulmonary Flowsheets:   ACT:      No data to display          MMRC:     No data to display          Epworth:      No data to display          Tests:   FENO:  No results found for: "NITRICOXIDE"  PFT:     No data to display          WALK:      No data to display          Imaging: Personally CT Chest Wo Contrast Result Date: 03/29/2023 CLINICAL DATA:  Complicated pneumonia EXAM: CT CHEST WITHOUT CONTRAST TECHNIQUE: Multidetector CT imaging of the chest was performed following the standard protocol without IV contrast. RADIATION  DOSE REDUCTION: This exam was performed according to the departmental dose-optimization program which includes automated exposure control, adjustment of the mA and/or kV according to patient size and/or use of iterative reconstruction technique. COMPARISON:  Chest x-ray February 24, 2023 FINDINGS: Cardiovascular: No significant vascular findings. Normal heart size. No pericardial effusion. Moderate coronary artery calcifications no pericardial effusions Mediastinum/Nodes: No enlarged mediastinal or axillary lymph nodes. Thyroid gland, trachea, and esophagus demonstrate no significant findings. Multiple left axillary surgical clips correlate clinically. Lungs/Pleura: Prominence of the interstitial markings bilateral with ill-defined ground-glass appearing infiltrates within the peripheral portion of the left upper lobe anterior segment, peripheral margin of the posterior segment of the right upper lobe with lingular atelectasis and bronchiectasis. And left lower lobe hypoventilatory atelectatic changes with prominence of the interstitial markings and small intrapulmonary blebs. Findings could correlate with minimal residual inflammatory changes secondary to prior pneumonia or pneumonitis. Upper Abdomen: No acute abnormality. Musculoskeletal: No chest wall mass or suspicious bone lesions identified. Mild dextroscoliosis thoracic spine IMPRESSION: Prominence of the interstitial markings bilateral with ill-defined ground-glass appearing infiltrates within the peripheral portion of the left upper lobe anterior segment, peripheral margin of the posterior segment of the right upper lobe with lingular atelectasis and bronchiectasis. And left  lower lobe hypoventilatory atelectatic changes with prominence of the interstitial markings and small intrapulmonary blebs. Findings could correlate with minimal residual inflammatory changes secondary to prior pneumonia or pneumonitis. Electronically Signed   By: Shaaron Adler M.D.    On: 03/29/2023 14:47    Lab Results: Personally reviewed CBC    Component Value Date/Time   WBC 4.0 03/14/2023 1608   RBC 4.05 03/14/2023 1608   HGB 11.9 03/14/2023 1608   HCT 36.0 03/14/2023 1608   PLT 255 03/14/2023 1608   MCV 88.9 03/14/2023 1608   MCV 84.8 05/07/2014 1115   MCH 29.4 03/14/2023 1608   MCHC 33.1 03/14/2023 1608   RDW 12.9 03/14/2023 1608   LYMPHSABS 0.8 01/13/2023 0556   MONOABS 0.3 01/13/2023 0556   EOSABS 68 03/14/2023 1608   BASOSABS 28 03/14/2023 1608    BMET    Component Value Date/Time   NA 141 03/14/2023 1608   K 3.7 03/14/2023 1608   CL 106 03/14/2023 1608   CO2 24 03/14/2023 1608   GLUCOSE 108 (H) 03/14/2023 1608   BUN 21 03/14/2023 1608   CREATININE 1.42 (H) 03/14/2023 1608   CALCIUM 9.6 03/14/2023 1608   GFRNONAA 39 (L) 01/13/2023 0556   GFRNONAA 35 (L) 06/23/2020 1111   GFRAA 40 (L) 06/23/2020 1111    BNP    Component Value Date/Time   BNP 100 (H) 01/31/2023 1221    ProBNP No results found for: "PROBNP"  Specialty Problems       Pulmonary Problems   Pulmonary nodules   Multifocal pneumonia   Acute hypoxemic respiratory failure (HCC)    Allergies  Allergen Reactions   Levaquin [Levofloxacin In D5w] Other (See Comments)    Lower extremity pain     Immunization History  Administered Date(s) Administered   Fluad Quad(high Dose 88+) 09/10/2021   Influenza Split 09/27/2010, 10/05/2011   Influenza,inj,Quad PF,6+ Mos 10/05/2012, 10/23/2013, 09/25/2014, 09/11/2015, 10/13/2016, 09/29/2017, 09/12/2018, 09/12/2019   Influenza-Unspecified 09/27/2010, 10/05/2011, 09/25/2020, 09/19/2022   PFIZER(Purple Top)SARS-COV-2 Vaccination 01/24/2019, 02/14/2019, 10/31/2019   PNEUMOCOCCAL CONJUGATE-20 09/10/2021   Pfizer Covid-19 Vaccine Bivalent Booster 51yrs & up 09/25/2020   Pfizer(Comirnaty)Fall Seasonal Vaccine 12 years and older 10/11/2021   Pneumococcal Conjugate-13 11/06/2013   Pneumococcal Polysaccharide-23 02/17/2009    Respiratory Syncytial Virus Vaccine,Recomb Aduvanted(Arexvy) 10/18/2021   Tdap 04/26/1996, 08/16/2011, 06/20/2019   Zoster Recombinant(Shingrix) 06/14/2017, 08/23/2017   Zoster, Live 07/29/2008    Past Medical History:  Diagnosis Date   Cancer (HCC)    melanoma l shoulder   GERD (gastroesophageal reflux disease)    Hyperlipidemia    Macular degeneration    Melanoma (HCC)    Meniere's disease    Osteoporosis    osteopenia   Thyroid disease     Tobacco History: Social History   Tobacco Use  Smoking Status Never  Smokeless Tobacco Never   Counseling given: Not Answered   Continue to not smoke  Outpatient Encounter Medications as of 04/03/2023  Medication Sig   benzonatate (TESSALON) 100 MG capsule Take 1 capsule (100 mg total) by mouth 3 (three) times daily as needed.   calcium-vitamin D (OSCAL WITH D) 500-200 MG-UNIT per tablet Take 1 tablet by mouth daily.   cholecalciferol (VITAMIN D3) 25 MCG (1000 UNIT) tablet Take 1,000 Units by mouth daily.   cyclobenzaprine (FLEXERIL) 10 MG tablet Take 1 tablet (10 mg total) by mouth at bedtime. (Patient taking differently: Take 10 mg by mouth daily as needed for muscle spasms.)   dicyclomine (BENTYL) 20 MG tablet  Take 1 tablet (20 mg total) by mouth daily.   doxycycline (VIBRA-TABS) 100 MG tablet Take 1 tablet (100 mg total) by mouth 2 (two) times daily.   famotidine (PEPCID) 40 MG tablet Take 1 tablet (40 mg total) by mouth in the morning and at bedtime.   ferrous sulfate 325 (65 FE) MG EC tablet Take 325 mg by mouth daily with breakfast.   ipratropium (ATROVENT) 0.06 % nasal spray USE 2 SPRAYS IN BOTH NOSTRILS  EVERY 6 HOURS IF NEEDED TO DRY  UP NOSE (Patient taking differently: Place 1 spray into the nose in the morning and at bedtime.)   latanoprost (XALATAN) 0.005 % ophthalmic solution Place 1 drop into both eyes at bedtime.   meclizine (ANTIVERT) 25 MG tablet Take 1 tablet (25 mg total) by mouth 3 (three) times daily as needed.  (Patient taking differently: Take 25 mg by mouth 3 (three) times daily as needed for nausea.)   methimazole (TAPAZOLE) 5 MG tablet Take 1 tablet (5 mg total) by mouth as directed. 1 tablet Monday through Saturday and none on Sundays   Multiple Vitamins-Minerals (PRESERVISION AREDS PO) Take 1 tablet by mouth in the morning and at bedtime.   ondansetron (ZOFRAN-ODT) 4 MG disintegrating tablet Take 4 mg by mouth every 6 (six) hours as needed.   PRESCRIPTION MEDICATION Take 8 mg by mouth daily. Betahistine Dihydrochloride 8 mg   PRESCRIPTION MEDICATION Place 1 spray into the nose daily. Unknown nasal spray   triamterene-hydrochlorothiazide (DYAZIDE) 37.5-25 MG capsule TAKE 1 CAPSULE BY MOUTH EVERY DAY   Vitamins-Lipotropics (LIPOFLAVONOID PO) Take 1 tablet by mouth 2 (two) times daily.   No facility-administered encounter medications on file as of 04/03/2023.     Review of Systems  Review of Systems  No chest pain with exertion.  No orthopnea or PND.  Comprehensive review of systems otherwise negative. Physical Exam  BP 133/73 (BP Location: Left Arm, Patient Position: Sitting, Cuff Size: Normal)   Pulse 68   Ht 5\' 3"  (1.6 m)   Wt 147 lb (66.7 kg)   SpO2 98%   BMI 26.04 kg/m   Wt Readings from Last 5 Encounters:  04/03/23 147 lb (66.7 kg)  02/24/23 148 lb 6 oz (67.3 kg)  02/16/23 149 lb (67.6 kg)  02/10/23 148 lb (67.1 kg)  01/31/23 148 lb (67.1 kg)    BMI Readings from Last 5 Encounters:  04/03/23 26.04 kg/m  02/24/23 27.58 kg/m  02/16/23 27.70 kg/m  02/10/23 27.51 kg/m  01/31/23 27.51 kg/m     Physical Exam General: Sitting in chair, no acute distress Eyes: EOMI, no icterus Neck: Supple, JVP Pulmonary: Normal work of breathing, clear Cardiovascular: Warm, no edema Abdomen: Nondistended MSK: No synovitis with no joint effusion Neuro: No focal deficits noted, daughter notes she is legally blind Psych: Normal mood, full affect   Assessment & Plan:   History of  pneumonia: Reviewed series of chest x-ray 01/2023 that showed gradual resolution of bilateral infiltrates most prominent right lower lung field and left perihilar area.  There is residual shadowing of the left lung peripherally near the heart border that looks similar to 2017 and correlates with area of bronchiectasis/scarring personally from prior infection that is seen on the CT scan recently as well as in 2016.  Overall gradual improvement with faint groundglass opacities and areas of interlobular septal thickening likely resolving inflammatory infiltrate versus developing area of scarring related to recent pneumonia.  Recommend no further imaging or follow-up.   Return if  symptoms worsen or fail to improve.   Karren Burly, MD 04/03/2023   This appointment required 45 minutes of patient care (this includes precharting, chart review, review of results, face-to-face care, etc.).

## 2023-04-03 NOTE — Patient Instructions (Signed)
 It is nice to meet you  I reviewed and we reviewed in the room the changes of your chest x-ray throughout January.  This shows gradual improvement in the shadows.  There was a persistent shadow on the left.  On review of chest x-ray in 2019 I think this is unchanged.  This likely coincides with the area of old scarring that we see on the CT scan recently as well as the CT scan back in 2016.  There are some faint groundglass opacities or infiltrates that I think represent improving areas of pneumonia.  All in all, I think this is very reassuring and not much to worry about.  Return to clinic as needed

## 2023-04-05 DIAGNOSIS — D631 Anemia in chronic kidney disease: Secondary | ICD-10-CM | POA: Diagnosis not present

## 2023-04-05 DIAGNOSIS — H8109 Meniere's disease, unspecified ear: Secondary | ICD-10-CM | POA: Diagnosis not present

## 2023-04-05 DIAGNOSIS — N2581 Secondary hyperparathyroidism of renal origin: Secondary | ICD-10-CM | POA: Diagnosis not present

## 2023-04-05 DIAGNOSIS — N1832 Chronic kidney disease, stage 3b: Secondary | ICD-10-CM | POA: Diagnosis not present

## 2023-04-05 DIAGNOSIS — Q6102 Congenital multiple renal cysts: Secondary | ICD-10-CM | POA: Diagnosis not present

## 2023-04-05 DIAGNOSIS — N189 Chronic kidney disease, unspecified: Secondary | ICD-10-CM | POA: Diagnosis not present

## 2023-04-07 ENCOUNTER — Ambulatory Visit: Payer: Self-pay

## 2023-04-07 NOTE — Patient Instructions (Signed)
 Visit Information  Thank you for taking time to visit with me today. Please don't hesitate to contact me if I can be of assistance to you.   Following are the goals we discussed today:   Goals Addressed             This Visit's Progress    To fully recover from pneumonia   On track    Care Coordination Interventions: Completed successful outbound call with daughter Rayetta Humphrey  Evaluation of current treatment plan related to pneumonia and patient's adherence to plan as established by provider Reviewed and discussed patient's current respiratory status, she has a lingering cough but feels well Reviewed and discussed recent OV with PCP and Pulmonology, reviewed recommendations and interventions provided Assessed for hydration/nutrition status, educated daughter regarding hydration, aim for having patient drink 48-64 oz daily unless otherwise directed, encourage proteins and fresh vegetables to help ensure a healthy well balanced diet Instructed daughter to notify PCP of new symptoms or concerns, follow up with pulmonology is only for new or worsening symptoms Discussed plans with daughter for ongoing care coordination follow up and confirmed daughter has direct contact information for nurse care coordinator Scheduled a nurse follow up call for 05/05/23 @09 :30 AM        To maintain and or improve kidney function       Care Coordination Interventions: Assessed the POA daughter Rayetta Humphrey understanding of chronic kidney disease    Evaluation of current treatment plan related to chronic kidney disease self management and patient's adherence to plan as established by provider      Reviewed prescribed diet increase daily water intake to 48-64 oz daily  Provided education on kidney disease progression    Discussed plans with daughter for ongoing care coordination follow up and confirmed daughter has direct contact information for nurse care coordinator Scheduled a nurse follow up call for  05/05/23 @09 :30 AM Last practice recorded BP readings:  BP Readings from Last 3 Encounters:  04/03/23 133/73  02/24/23 134/76  02/16/23 110/70   Most recent eGFR/CrCl:  Lab Results  Component Value Date   EGFR 36 (L) 03/14/2023    No components found for: "CRCL"            Our next appointment is by telephone on 05/05/23 at 09:30 AM  Please call the care guide team at [phone] if you need to cancel or reschedule your appointment.   If you are experiencing a Mental Health or Behavioral Health Crisis or need someone to talk to, please call 1-800-273-TALK (toll free, 24 hour hotline)  Patient verbalizes understanding of instructions and care plan provided today and agrees to view in MyChart. Active MyChart status and patient understanding of how to access instructions and care plan via MyChart confirmed with patient.     Delsa Sale RN BSN CCM [place]  [facility], [facility] Nurse Care Coordinator  Direct Dial: [phone] Website: [email or web address]

## 2023-04-07 NOTE — Patient Outreach (Signed)
 Care Coordination   Follow Up Visit Note   04/07/2023 Name: Sabrina English MRN: [id number] DOB: 1933-03-22  Sabrina English is a 89 y.o. year old female who sees Baxley, Luanna Cole, MD for primary care. I spoke with daughter Rayetta Humphrey by phone today.  What matters to the patients health and wellness today?  Patient would like to have her lingering cough subside. She would like to maintain or improve her kidney function.     Goals Addressed             This Visit's Progress    To fully recover from pneumonia   On track    Care Coordination Interventions: Completed successful outbound call with daughter Rayetta Humphrey  Evaluation of current treatment plan related to pneumonia and patient's adherence to plan as established by provider Reviewed and discussed patient's current respiratory status, she has a lingering cough but feels well Reviewed and discussed recent OV with PCP and Pulmonology, reviewed recommendations and interventions provided Assessed for hydration/nutrition status, educated daughter regarding hydration, aim for having patient drink 48-64 oz daily unless otherwise directed, encourage proteins and fresh vegetables to help ensure a healthy well balanced diet Instructed daughter to notify PCP of new symptoms or concerns, follow up with pulmonology is only for new or worsening symptoms Discussed plans with daughter for ongoing care coordination follow up and confirmed daughter has direct contact information for nurse care coordinator Scheduled a nurse follow up call for 05/05/23 @09 :30 AM        To maintain and or improve kidney function       Care Coordination Interventions: Assessed the POA daughter Rayetta Humphrey understanding of chronic kidney disease    Evaluation of current treatment plan related to chronic kidney disease self management and patient's adherence to plan as established by provider      Reviewed prescribed diet increase daily water intake to 48-64 oz  daily  Provided education on kidney disease progression    Discussed plans with daughter for ongoing care coordination follow up and confirmed daughter has direct contact information for nurse care coordinator Scheduled a nurse follow up call for 05/05/23 @09 :30 AM Last practice recorded BP readings:  BP Readings from Last 3 Encounters:  04/03/23 133/73  02/24/23 134/76  02/16/23 110/70   Most recent eGFR/CrCl:  Lab Results  Component Value Date   EGFR 36 (L) 03/14/2023    No components found for: "CRCL"        Interventions Today    Flowsheet Row Most Recent Value  Chronic Disease   Chronic disease during today's visit Other, Chronic Kidney Disease/End Stage Renal Disease (ESRD)  [s/p pneumonia,  thyroid disease]  General Interventions   General Interventions Discussed/Reviewed General Interventions Discussed, General Interventions Reviewed, Labs, Doctor Visits  Doctor Visits Discussed/Reviewed Doctor Visits Reviewed, Doctor Visits Discussed, Specialist, PCP  Education Interventions   Education Provided Provided Education  Provided Verbal Education On When to see the doctor, Nutrition, Labs, Medication  Labs Reviewed Kidney Function  Nutrition Interventions   Nutrition Discussed/Reviewed Nutrition Discussed, Nutrition Reviewed, Increasing proteins, Fluid intake  Pharmacy Interventions   Pharmacy Dicussed/Reviewed Pharmacy Topics Discussed, Pharmacy Topics Reviewed          SDOH assessments and interventions completed:  No     Care Coordination Interventions:  Yes, provided   Follow up plan: Follow up call scheduled for 05/05/23 @09 :30 AM    Encounter Outcome:  Patient Visit Completed

## 2023-04-12 DIAGNOSIS — H353114 Nonexudative age-related macular degeneration, right eye, advanced atrophic with subfoveal involvement: Secondary | ICD-10-CM | POA: Diagnosis not present

## 2023-04-14 DIAGNOSIS — Z8669 Personal history of other diseases of the nervous system and sense organs: Secondary | ICD-10-CM | POA: Diagnosis not present

## 2023-04-14 DIAGNOSIS — H6121 Impacted cerumen, right ear: Secondary | ICD-10-CM | POA: Diagnosis not present

## 2023-04-14 DIAGNOSIS — H903 Sensorineural hearing loss, bilateral: Secondary | ICD-10-CM | POA: Diagnosis not present

## 2023-04-17 ENCOUNTER — Other Ambulatory Visit: Payer: Medicare Other

## 2023-04-17 DIAGNOSIS — E0590 Thyrotoxicosis, unspecified without thyrotoxic crisis or storm: Secondary | ICD-10-CM | POA: Diagnosis not present

## 2023-04-18 ENCOUNTER — Encounter: Payer: Self-pay | Admitting: Internal Medicine

## 2023-04-18 LAB — TSH: TSH: 2.5 m[IU]/L (ref 0.40–4.50)

## 2023-04-18 LAB — T4, FREE: Free T4: 1 ng/dL (ref 0.8–1.8)

## 2023-04-19 DIAGNOSIS — H353124 Nonexudative age-related macular degeneration, left eye, advanced atrophic with subfoveal involvement: Secondary | ICD-10-CM | POA: Diagnosis not present

## 2023-05-01 ENCOUNTER — Ambulatory Visit: Payer: Medicare Other | Admitting: Physician Assistant

## 2023-05-05 ENCOUNTER — Ambulatory Visit: Payer: Self-pay

## 2023-05-05 NOTE — Patient Instructions (Signed)
 Visit Information  Thank you for taking time to visit with me today.   Following is a copy of your care plan:   Goals Addressed             This Visit's Progress    COMPLETED: To fully recover from pneumonia       Care Coordination Interventions: Completed successful outbound call with daughter Ames Justin  Evaluation of current treatment plan related to pneumonia and patient's adherence to plan as established by provider Determined patient's pneumonia has resolved, daughter Genevia Kern reports patient is having no current symptoms related to her past pneumonia Completed medication reconciliation with daughter, with no discrepancies noted at this time Instructed daughter to keep patient's PCP well informed of new symptoms or concerns  Informed daughter of nurse case closure due to patient is staying healthy and managing her chronic conditions     COMPLETED: To maintain and or improve kidney function       Care Coordination Interventions: Assessed the POA daughter Ames Justin understanding of chronic kidney disease    Evaluation of current treatment plan related to chronic kidney disease self management and patient's adherence to plan as established by provider      Completed medication reconciliation with daughter Genevia Kern with no discrepancies noted at this time Informed daughter of nurse case closure due to patient is staying healthy and managing her chronic conditions Instructed daughter to keep patient's PCP well informed of new symptoms or concerns          Please call 1-800-273-TALK (toll free, 24 hour hotline) if you are experiencing a Mental Health or Behavioral Health Crisis or need someone to talk to.  Patient verbalizes understanding of instructions and care plan provided today and agrees to view in MyChart. Active MyChart status and patient understanding of how to access instructions and care plan via MyChart confirmed with patient.     Louanne Roussel RN BSN CCM [facility]  [facility], [facility] Nurse Care Coordinator  Direct Dial: [phone] Website: [email or web address]

## 2023-05-05 NOTE — Patient Outreach (Signed)
 Complex Care Management   Visit Note  05/05/2023  Name:  Sabrina English MRN: [id number] DOB: [birth year]  Situation: Referral received for Complex Care Management related to  Meniere disease, s/p pneumonia.  I obtained verbal consent from Patient.  Visit completed with patient  on the phone.  Background:   Past Medical History:  Diagnosis Date   Cancer (HCC)    melanoma l shoulder   GERD (gastroesophageal reflux disease)    Hyperlipidemia    Macular degeneration    Melanoma (HCC)    Meniere's disease    Osteoporosis    osteopenia   Thyroid  disease     Assessment: Patient Reported Symptoms:  Cognitive Cognitive Status: Alert and oriented to person, place, and time Cognitive/Intellectual Conditions Management [RPT]: None reported or documented in medical history or problem list   Health Maintenance Behaviors: Annual physical exam, Immunizations, Healthy diet, Social activities Healing Pattern: Average Health Facilitated by: Healthy diet  Neurological Neurological Review of Symptoms: Dizziness Neurological Conditions:  (Mnire's disease) Neurological Management Strategies: Medication therapy, Routine screening Neurological Self-Management Outcome: 4 (good)  HEENT HEENT Symptoms Reported: Change or loss of hearing (Meniers disease) HEENT Conditions: Ear problem(s), Vision problem(s) Vision Problems: blindness/vision loss (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, uses magnifier) HEENT Management Strategies: Medication therapy, Routine screening HEENT Self-Management Outcome: 4 (good) Ear problem(s), Vision problem(s)  Cardiovascular Cardiovascular Symptoms Reported: No symptoms reported Weight: 148 lb (67.1 kg) (daughter reported)  Respiratory Respiratory Symptoms Reported: No symptoms reported    Endocrine Patient reports the following symptoms related to hypoglycemia or hyperglycemia : No symptoms reported Is patient diabetic?: No Endocrine Conditions: Thyroid   disorder Endocrine Management Strategies: Medication therapy, Routine screening Endocrine Self-Management Outcome: 4 (good)  Gastrointestinal Gastrointestinal Symptoms Reported: No symptoms reported Gastrointestinal Conditions: Reflux/heartburn Gastrointestinal Management Strategies: Medication therapy Gastrointestinal Self-Management Outcome: 4 (good) Nutrition Risk Screen (CP): No indicators present  Genitourinary Genitourinary Symptoms Reported: No symptoms reported    Integumentary Integumentary Symptoms Reported: No symptoms reported    Musculoskeletal Musculoskelatal Symptoms Reviewed: No symptoms reported   Falls in the past year?: No Number of falls in past year: 1 or less Was there an injury with Fall?: No Fall Risk Category Calculator: 0 Patient Fall Risk Level: Low Fall Risk Patient at Risk for Falls Due to: Impaired vision Fall risk Follow up: Falls evaluation completed  Psychosocial Psychosocial Symptoms Reported: No symptoms reported   Major Change/Loss/Stressor/Fears (CP): Denies Techniques to Cope with Loss/Stress/Change: Diversional activities Quality of Family Relationships: involved, helpful, supportive Do you feel physically threatened by others?: No      05/05/2023   10:41 AM  Depression screen PHQ 2/9  Decreased Interest 0  Down, Depressed, Hopeless 0  PHQ - 2 Score 0    There were no vitals filed for this visit.  Medications Reviewed Today     Reviewed by Kaylene Pascal, RN (Registered Nurse) on 05/05/23 at 1026  Med List Status: <None>   Medication Order Taking? Sig Documenting Provider Last Dose Status Informant  benzonatate  (TESSALON ) 100 MG capsule [id number] Yes Take 1 capsule (100 mg total) by mouth 3 (three) times daily as needed. Sylvan Evener, MD Taking Active   calcium -vitamin D  (OSCAL WITH D) 500-200 MG-UNIT per tablet [id number] Yes Take 1 tablet by mouth daily. [provider] Taking Active Self, Child, Pharmacy Records   cholecalciferol  (VITAMIN D3) 25 MCG (1000 UNIT) tablet [id number] Yes Take 1,000 Units by mouth daily. [provider] Taking Active Self, Child, Pharmacy Records  cyclobenzaprine  (FLEXERIL ) 10 MG tablet [id number] Yes Take 1 tablet (10 mg total) by mouth at bedtime.  Patient taking differently: Take 10 mg by mouth daily as needed for muscle spasms.   Sylvan Evener, MD Taking Active Self, Child, Pharmacy Records           Med Note (CRUTHIS, [place] Hack Jan 08, 2023 10:09 AM) Pt is unsure of last dose.   dicyclomine  (BENTYL ) 20 MG tablet [id number] Yes Take 1 tablet (20 mg total) by mouth daily. Graciella Lavender, [place] Taking Active Self, Child, Pharmacy Records  doxycycline  (VIBRA -TABS) 100 MG tablet [id number]  Take 1 tablet (100 mg total) by mouth 2 (two) times daily. Sylvan Evener, MD  Consider Medication Status and Discontinue (Completed Course)   famotidine  (PEPCID ) 40 MG tablet [id number] Yes Take 1 tablet (40 mg total) by mouth in the morning and at bedtime. Graciella Lavender, [place] Taking Active   ferrous sulfate  325 (65 FE) MG EC tablet [id number] Yes Take 325 mg by mouth daily with breakfast. [provider] Taking Active Self, Child, Pharmacy Records  fluticasone (FLONASE) 50 MCG/ACT nasal spray [id number] Yes Place 2 sprays into both nostrils daily. [provider] Taking Active   ipratropium (ATROVENT ) 0.06 % nasal spray [id number] Yes USE 2 SPRAYS IN BOTH NOSTRILS  EVERY 6 HOURS IF NEEDED TO DRY  UP NOSE  Patient taking differently: Place 1 spray into the nose in the morning and at bedtime.   Sylvan Evener, MD Taking Active Self, Child, Pharmacy Records  latanoprost  (XALATAN ) 0.005 % ophthalmic solution [id number] Yes Place 1 drop into both eyes at bedtime. [provider] Taking Active Self, Child, Pharmacy Records  meclizine  (ANTIVERT ) 25 MG tablet [id number] Yes Take 1 tablet (25 mg total) by mouth 3 (three) times daily as needed.  Patient taking  differently: Take 25 mg by mouth 3 (three) times daily as needed for nausea.   Sylvan Evener, MD Taking Active Self, Child, Pharmacy Records  methimazole  (TAPAZOLE ) 5 MG tablet [id number] Yes Take 1 tablet (5 mg total) by mouth as directed. 1 tablet Monday through Saturday and none on Sundays Shamleffer, Ibtehal Jaralla, MD Taking Active   Multiple Vitamins-Minerals (PRESERVISION AREDS PO) [id number] Yes Take 1 tablet by mouth in the morning and at bedtime. [provider] Taking Active Self, Child, Pharmacy Records  ondansetron  (ZOFRAN -ODT) 4 MG disintegrating tablet [id number] Yes Take 4 mg by mouth every 6 (six) hours as needed. [provider] Taking Active   PRESCRIPTION MEDICATION [id number] Yes Take 8 mg by mouth daily. Betahistine Dihydrochloride 8 mg [provider] Taking Active Self, Child, Pharmacy Records  PRESCRIPTION MEDICATION [id number]  Place 1 spray into the nose daily. Unknown nasal spray [provider]  Consider Medication Status and Discontinue (Change in therapy) Self, Child, Pharmacy Records  triamterene -hydrochlorothiazide (DYAZIDE) 37.5-25 MG capsule [id number] Yes TAKE 1 CAPSULE BY MOUTH EVERY DAY Baxley, Jaynie Meyers, MD Taking Active   Vitamins-Lipotropics (LIPOFLAVONOID PO) [id number] Yes Take 1 tablet by mouth 2 (two) times daily. [provider] Taking Active Self, Child, Pharmacy Records            Recommendation:   PCP Follow-up  Follow Up Plan:   Closing From:  Complex Care Management  Louanne Roussel RN BSN CCM [facility]  [facility], [facility] Nurse Care Coordinator  Direct Dial: [phone] Website: [email or web address]

## 2023-05-24 DIAGNOSIS — H903 Sensorineural hearing loss, bilateral: Secondary | ICD-10-CM | POA: Diagnosis not present

## 2023-06-07 DIAGNOSIS — H353114 Nonexudative age-related macular degeneration, right eye, advanced atrophic with subfoveal involvement: Secondary | ICD-10-CM | POA: Diagnosis not present

## 2023-06-14 DIAGNOSIS — H353124 Nonexudative age-related macular degeneration, left eye, advanced atrophic with subfoveal involvement: Secondary | ICD-10-CM | POA: Diagnosis not present

## 2023-06-19 DIAGNOSIS — D2261 Melanocytic nevi of right upper limb, including shoulder: Secondary | ICD-10-CM | POA: Diagnosis not present

## 2023-06-19 DIAGNOSIS — Z85820 Personal history of malignant melanoma of skin: Secondary | ICD-10-CM | POA: Diagnosis not present

## 2023-06-19 DIAGNOSIS — L820 Inflamed seborrheic keratosis: Secondary | ICD-10-CM | POA: Diagnosis not present

## 2023-06-19 DIAGNOSIS — D1801 Hemangioma of skin and subcutaneous tissue: Secondary | ICD-10-CM | POA: Diagnosis not present

## 2023-06-19 DIAGNOSIS — L812 Freckles: Secondary | ICD-10-CM | POA: Diagnosis not present

## 2023-06-19 DIAGNOSIS — Z85828 Personal history of other malignant neoplasm of skin: Secondary | ICD-10-CM | POA: Diagnosis not present

## 2023-06-19 DIAGNOSIS — L821 Other seborrheic keratosis: Secondary | ICD-10-CM | POA: Diagnosis not present

## 2023-06-22 IMAGING — CT CT ABD-PELV W/ CM
2 of 5 series · 13 of 46 positions shown, 15 images · IV contrast (agent unspecified)
Comparison: None

CLINICAL DATA: LEFT lower quadrant abdominal pain for 2 weeks,
periumbilical pain, nausea, bloating, fatigue, history melanoma post
surgery and immunotherapy, GERD

EXAM:
CT ABDOMEN AND PELVIS WITH CONTRAST
TECHNIQUE: Multidetector CT imaging of the abdomen and pelvis was performed
using the standard protocol following bolus administration of
intravenous contrast.

[Series 2: abd pelvis 5.00 br40 s3 axial · axial · 0.65mm/px · z∈[+1023,+1438]mm · 10 of 93 slices shown, 12 images]
[im 5/93  soft-tissue]
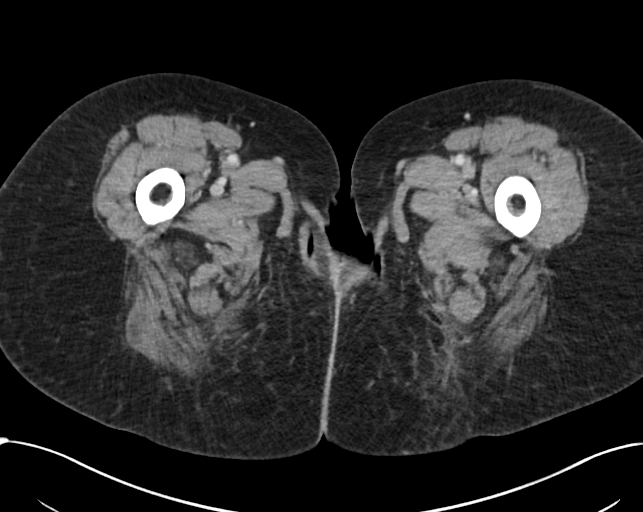
[im 5/93  bone]
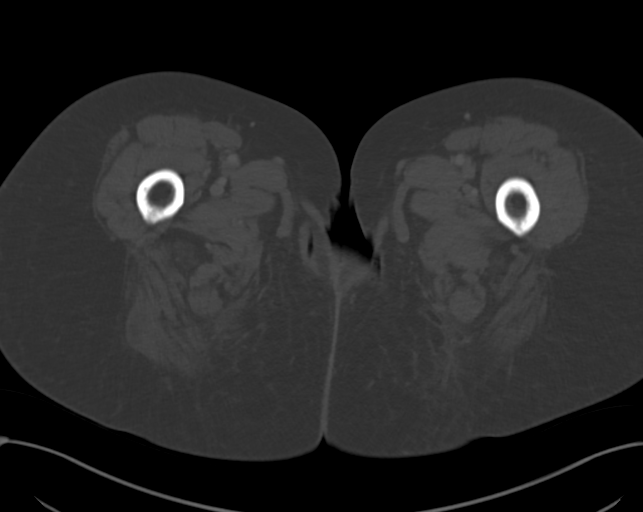
[im 15/93  soft-tissue]
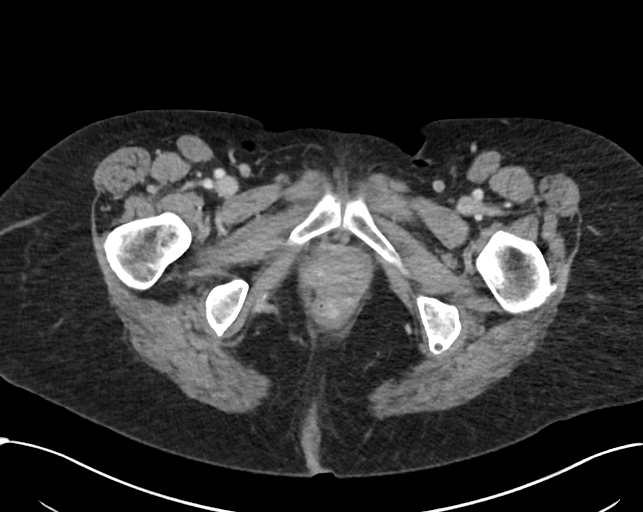
[im 25/93  soft-tissue]
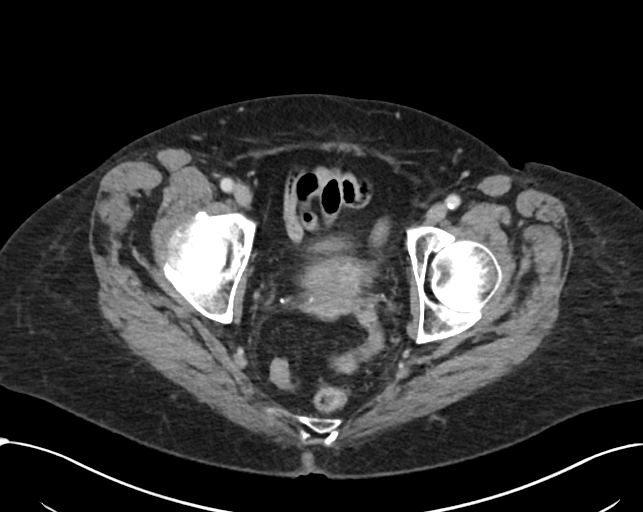
[im 34/93  soft-tissue]
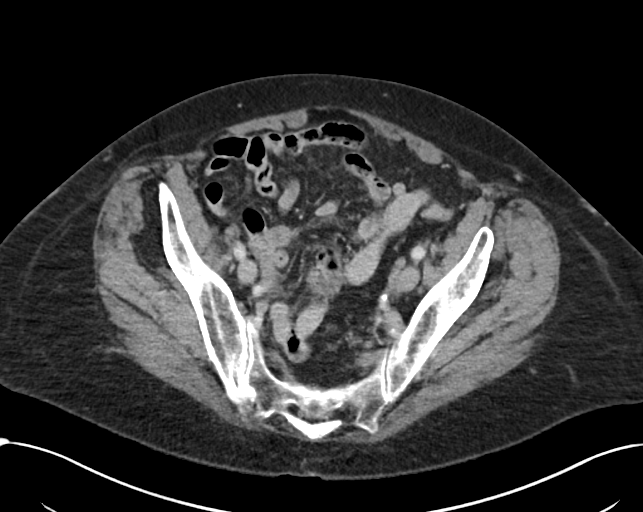
[im 44/93  soft-tissue]
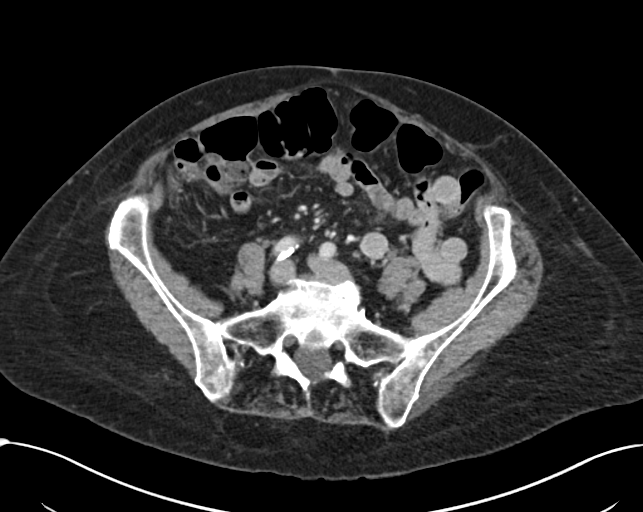
[im 49/93  soft-tissue]
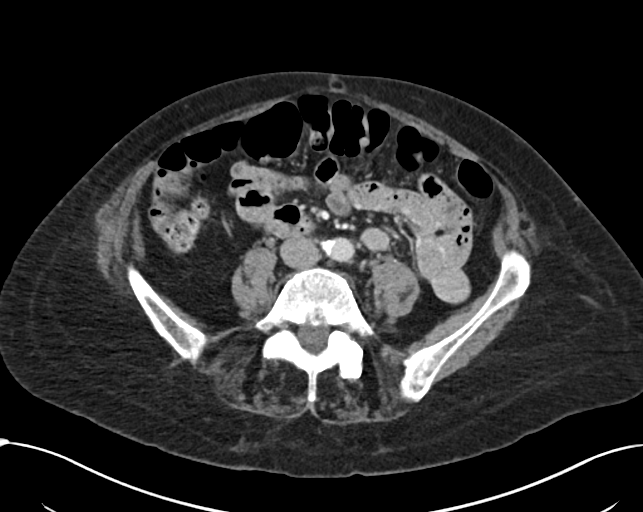
[im 59/93  soft-tissue]
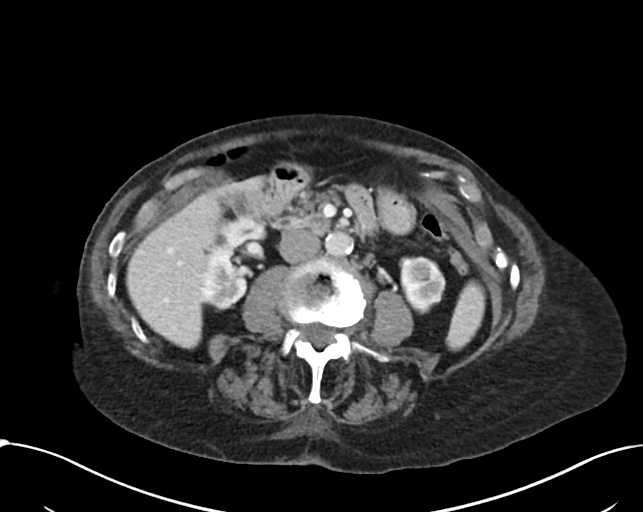
[im 68/93  soft-tissue]
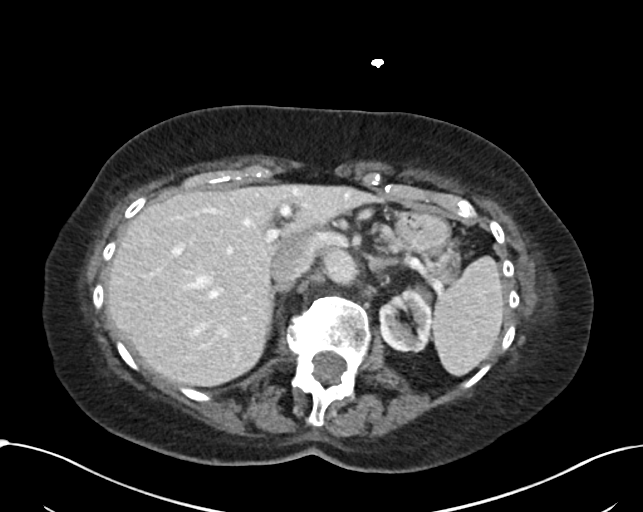
[im 78/93  soft-tissue]
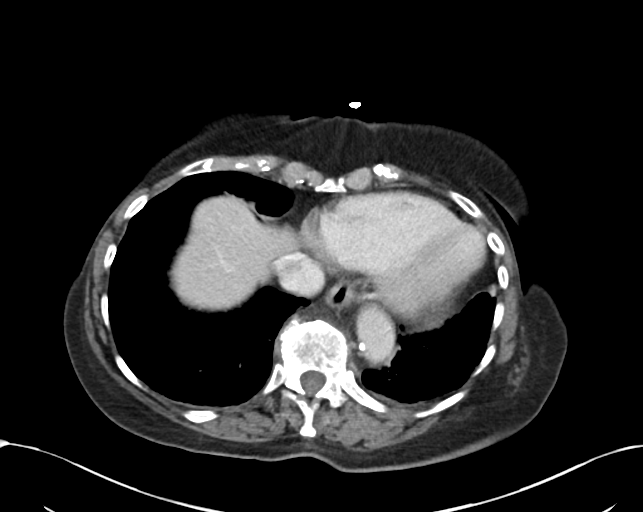
[im 78/93  bone]
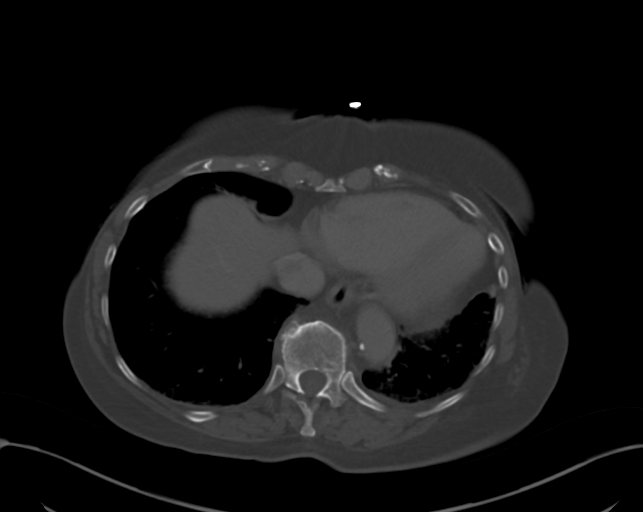
[im 88/93  soft-tissue]
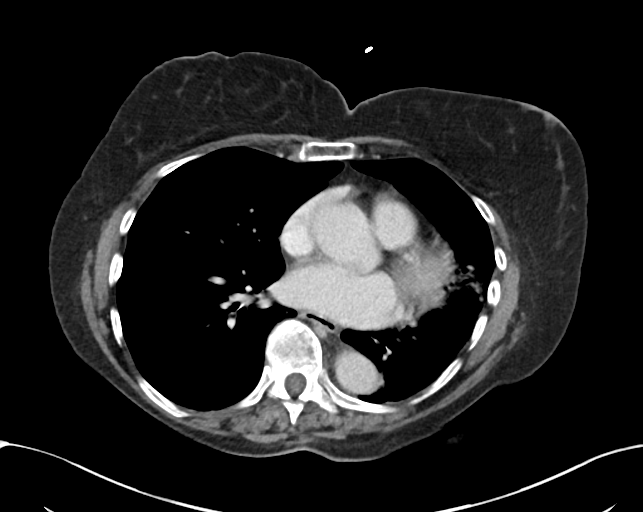

[Series 6: abd pelvis 2.00 br40 s3 cor · coronal · 0.81mm/px · 3 of 160 slices shown]
[im 54/160  soft-tissue]
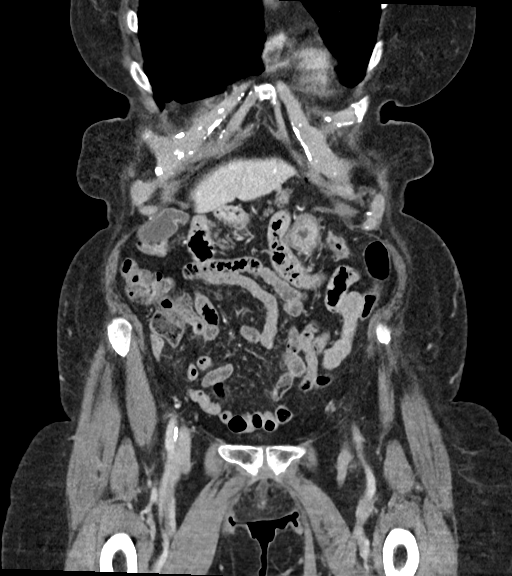
[im 71/160  soft-tissue]
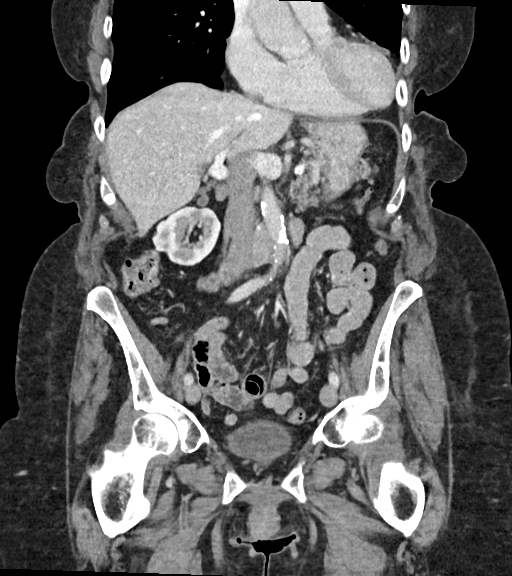
[im 89/160  soft-tissue]
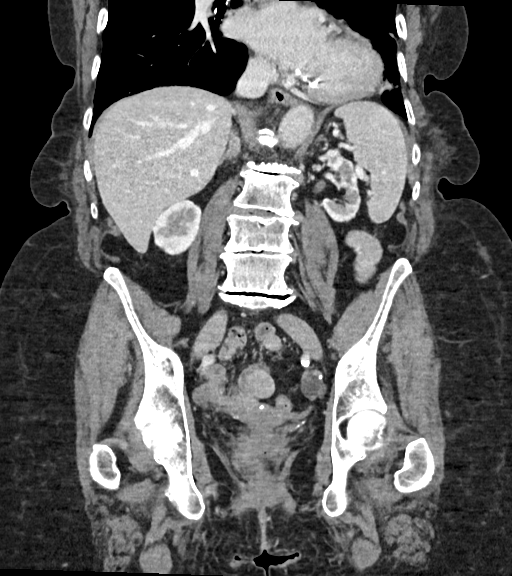

[13 of 46 positions shown; findings below may reference images not displayed]

RADIATION DOSE REDUCTION: This exam was performed according to the
departmental dose-optimization program which includes automated
exposure control, adjustment of the mA and/or kV according to
patient size and/or use of iterative reconstruction technique.

CONTRAST:  80mL XR3WXD-XBB IOPAMIDOL (XR3WXD-XBB) INJECTION 61% IV.
No oral contrast.
FINDINGS: Lower chest: Subsegmental atelectasis LEFT lower lobe and base of
lingula.

Hepatobiliary: Gallbladder and liver normal appearance

Pancreas: Atrophic pancreas without mass

Spleen: Normal appearance

Adrenals/Urinary Tract: Adrenal glands normal appearance. Cortical
scarring, thinning, and atrophy of LEFT kidney. Tiny BILATERAL renal
cysts; no follow-up imaging recommended. RIGHT kidney otherwise
unremarkable. No urinary tract calcification, hydronephrosis or
hydroureter. Bladder unremarkable.

Stomach/Bowel: Normal appendix coiled adjacent to cecal tip. Stomach
and bowel loops normal appearance.

Vascular/Lymphatic: Atherosclerotic calcifications aorta, iliac
arteries, coronary arteries. Aorta normal caliber. Vascular
structures patent. No adenopathy.

Reproductive: Atrophic uterus. Unremarkable RIGHT ovary. 1.6 cm left
ovarian simple-appearing cyst. No follow-up imaging is recommended.
Reference: JACR [DATE]):[id number]

Other: No free air or free fluid. No hernia or inflammatory process.

Musculoskeletal: Osseous demineralization. Multilevel degenerative
disc and facet disease changes thoracolumbar spine. Mild
retrolisthesis L1-L2.1
IMPRESSION: Cortical atrophy and scarring of LEFT kidney.

Subsegmental atelectasis LEFT lung base.

No acute intra-abdominal or intrapelvic abnormalities.

Aortic Atherosclerosis ([id number]).

## 2023-07-10 ENCOUNTER — Other Ambulatory Visit: Payer: Medicare Other

## 2023-07-13 ENCOUNTER — Other Ambulatory Visit

## 2023-07-13 DIAGNOSIS — Z0000 Encounter for general adult medical examination without abnormal findings: Secondary | ICD-10-CM

## 2023-07-13 DIAGNOSIS — E0590 Thyrotoxicosis, unspecified without thyrotoxic crisis or storm: Secondary | ICD-10-CM

## 2023-07-13 DIAGNOSIS — M8580 Other specified disorders of bone density and structure, unspecified site: Secondary | ICD-10-CM | POA: Diagnosis not present

## 2023-07-13 DIAGNOSIS — M109 Gout, unspecified: Secondary | ICD-10-CM | POA: Diagnosis not present

## 2023-07-14 ENCOUNTER — Ambulatory Visit: Payer: Medicare Other | Admitting: Internal Medicine

## 2023-07-14 ENCOUNTER — Ambulatory Visit: Payer: Self-pay | Admitting: Internal Medicine

## 2023-07-14 ENCOUNTER — Encounter: Payer: Self-pay | Admitting: Internal Medicine

## 2023-07-14 VITALS — BP 110/80 | HR 68 | Ht 63.0 in | Wt 145.0 lb

## 2023-07-14 DIAGNOSIS — Z8659 Personal history of other mental and behavioral disorders: Secondary | ICD-10-CM

## 2023-07-14 DIAGNOSIS — K219 Gastro-esophageal reflux disease without esophagitis: Secondary | ICD-10-CM

## 2023-07-14 DIAGNOSIS — H8109 Meniere's disease, unspecified ear: Secondary | ICD-10-CM | POA: Diagnosis not present

## 2023-07-14 DIAGNOSIS — N1832 Chronic kidney disease, stage 3b: Secondary | ICD-10-CM | POA: Diagnosis not present

## 2023-07-14 DIAGNOSIS — M25571 Pain in right ankle and joints of right foot: Secondary | ICD-10-CM | POA: Diagnosis not present

## 2023-07-14 DIAGNOSIS — Z85820 Personal history of malignant melanoma of skin: Secondary | ICD-10-CM

## 2023-07-14 DIAGNOSIS — Z8719 Personal history of other diseases of the digestive system: Secondary | ICD-10-CM

## 2023-07-14 DIAGNOSIS — E0590 Thyrotoxicosis, unspecified without thyrotoxic crisis or storm: Secondary | ICD-10-CM | POA: Diagnosis not present

## 2023-07-14 DIAGNOSIS — E785 Hyperlipidemia, unspecified: Secondary | ICD-10-CM

## 2023-07-14 DIAGNOSIS — M8580 Other specified disorders of bone density and structure, unspecified site: Secondary | ICD-10-CM | POA: Diagnosis not present

## 2023-07-14 DIAGNOSIS — H903 Sensorineural hearing loss, bilateral: Secondary | ICD-10-CM

## 2023-07-14 DIAGNOSIS — M109 Gout, unspecified: Secondary | ICD-10-CM | POA: Diagnosis not present

## 2023-07-14 DIAGNOSIS — J302 Other seasonal allergic rhinitis: Secondary | ICD-10-CM

## 2023-07-14 DIAGNOSIS — H3530 Unspecified macular degeneration: Secondary | ICD-10-CM

## 2023-07-14 DIAGNOSIS — R55 Syncope and collapse: Secondary | ICD-10-CM

## 2023-07-14 DIAGNOSIS — Z0000 Encounter for general adult medical examination without abnormal findings: Secondary | ICD-10-CM | POA: Diagnosis not present

## 2023-07-14 MED ORDER — CYCLOBENZAPRINE HCL 10 MG PO TABS: 10.0000 mg | ORAL_TABLET | Freq: Every day | ORAL | 1 refills | Status: AC

## 2023-07-14 MED ORDER — MECLIZINE HCL 25 MG PO TABS: 25.0000 mg | ORAL_TABLET | Freq: Three times a day (TID) | ORAL | 11 refills | Status: AC | PRN

## 2023-07-14 MED ORDER — DOXYCYCLINE HYCLATE 100 MG PO TABS: 100.0000 mg | ORAL_TABLET | Freq: Two times a day (BID) | ORAL | 0 refills | Status: DC

## 2023-07-14 NOTE — Progress Notes (Signed)
 Subjective:   Sabrina English is a 89 y.o. female who presents for Medicare Annual (Subsequent) preventive examination.  Visit Complete: In person  Patient Medicare AWV questionnaire was completed by the patient on 07/14/2023; I have confirmed that all information answered by patient is correct and no changes since this date.  Cardiac Risk Factors include: advanced age (>34men, >28 women);dyslipidemia     Objective:    Today's Vitals   07/14/23 1056 07/14/23 1104  BP: 110/80   Pulse: 68   SpO2: 98%   Weight: 145 lb (65.8 kg)   Height: 5' 3 (1.6 m)   PainSc: 2  0-No pain  PainLoc: Foot    Body mass index is 25.69 kg/m.     01/07/2023    7:25 PM 07/01/2022    2:48 PM 06/28/2021    3:04 PM 06/05/2014    2:42 PM  Advanced Directives  Does Patient Have a Medical Advance Directive? No Yes Yes Yes   Type of Special educational needs teacher of [place];Living will Healthcare Power of [place];Living will Healthcare Power of [place];Living will   Does patient want to make changes to medical advance directive?  No - Patient declined No - Patient declined   Copy of Healthcare Power of Attorney in Chart?  Yes - validated most recent copy scanned in chart (See row information) Yes - validated most recent copy scanned in chart (See row information)   Would patient like information on creating a medical advance directive? No - Patient declined        Data saved with a previous flowsheet row definition    Current Medications (verified) Outpatient Encounter Medications as of 07/14/2023  Medication Sig   doxycycline  (VIBRA -TABS) 100 MG tablet Take 1 tablet (100 mg total) by mouth 2 (two) times daily.   benzonatate  (TESSALON ) 100 MG capsule Take 1 capsule (100 mg total) by mouth 3 (three) times daily as needed.   calcium -vitamin D  (OSCAL WITH D) 500-200 MG-UNIT per tablet Take 1 tablet by mouth daily.   cholecalciferol  (VITAMIN D3) 25 MCG (1000 UNIT) tablet Take 1,000 Units by mouth  daily.   cyclobenzaprine  (FLEXERIL ) 10 MG tablet Take 1 tablet (10 mg total) by mouth at bedtime.   dicyclomine  (BENTYL ) 20 MG tablet Take 1 tablet (20 mg total) by mouth daily.   famotidine  (PEPCID ) 40 MG tablet Take 1 tablet (40 mg total) by mouth in the morning and at bedtime.   ferrous sulfate  325 (65 FE) MG EC tablet Take 325 mg by mouth daily with breakfast.   fluticasone (FLONASE) 50 MCG/ACT nasal spray Place 2 sprays into both nostrils daily.   ipratropium (ATROVENT ) 0.06 % nasal spray USE 2 SPRAYS IN BOTH NOSTRILS  EVERY 6 HOURS IF NEEDED TO DRY  UP NOSE (Patient taking differently: Place 1 spray into the nose in the morning and at bedtime.)   latanoprost  (XALATAN ) 0.005 % ophthalmic solution Place 1 drop into both eyes at bedtime.   meclizine  (ANTIVERT ) 25 MG tablet Take 1 tablet (25 mg total) by mouth 3 (three) times daily as needed.   methimazole  (TAPAZOLE ) 5 MG tablet Take 1 tablet (5 mg total) by mouth as directed. 1 tablet Monday through Saturday and none on Sundays   Multiple Vitamins-Minerals (PRESERVISION AREDS PO) Take 1 tablet by mouth in the morning and at bedtime.   ondansetron  (ZOFRAN -ODT) 4 MG disintegrating tablet Take 4 mg by mouth every 6 (six) hours as needed.   PRESCRIPTION MEDICATION Take 8 mg by  mouth daily. Betahistine Dihydrochloride 8 mg   PRESCRIPTION MEDICATION Place 1 spray into the nose daily. Unknown nasal spray   triamterene -hydrochlorothiazide (DYAZIDE) 37.5-25 MG capsule TAKE 1 CAPSULE BY MOUTH EVERY DAY   Vitamins-Lipotropics (LIPOFLAVONOID PO) Take 1 tablet by mouth 2 (two) times daily.   [DISCONTINUED] cyclobenzaprine  (FLEXERIL ) 10 MG tablet Take 1 tablet (10 mg total) by mouth at bedtime. (Patient taking differently: Take 10 mg by mouth daily as needed for muscle spasms.)   [DISCONTINUED] doxycycline  (VIBRA -TABS) 100 MG tablet Take 1 tablet (100 mg total) by mouth 2 (two) times daily.   [DISCONTINUED] meclizine  (ANTIVERT ) 25 MG tablet Take 1 tablet (25  mg total) by mouth 3 (three) times daily as needed. (Patient taking differently: Take 25 mg by mouth 3 (three) times daily as needed for nausea.)   No facility-administered encounter medications on file as of 07/14/2023.    Allergies (verified) Levaquin  [levofloxacin  in d5w]   History: Past Medical History:  Diagnosis Date   Cancer (HCC)    melanoma l shoulder   GERD (gastroesophageal reflux disease)    Hyperlipidemia    Macular degeneration    Melanoma (HCC)    Meniere's disease    Osteoporosis    osteopenia   Thyroid  disease    Past Surgical History:  Procedure Laterality Date   AXILLARY NODE DISSECTION  1983   BUNIONECTOMY     CATARACT Bilateral    MELANOMA EXCISION     left shoulder   Family History  Problem Relation Age of Onset   Mental illness Mother    Hyperlipidemia Mother    Cancer Father    Asthma Father    Cancer Sister    Hypothyroidism Brother    Allergic rhinitis Neg Hx    Angioedema Neg Hx    Atopy Neg Hx    Eczema Neg Hx    Immunodeficiency Neg Hx    Urticaria Neg Hx    Colon cancer Neg Hx    Esophageal cancer Neg Hx    Pancreatic cancer Neg Hx    Stomach cancer Neg Hx    Social History   Socioeconomic History   Marital status: Widowed    Spouse name: Not on file   Number of children: 3   Years of education: Not on file   Highest education level: 12th grade  Occupational History   Occupation: retired  Tobacco Use   Smoking status: Never   Smokeless tobacco: Never  Vaping Use   Vaping status: Never Used  Substance and Sexual Activity   Alcohol use: No    Alcohol/week: 0.0 standard drinks of alcohol   Drug use: No   Sexual activity: Not Currently  Other Topics Concern   Not on file  Social History Narrative   Not on file   Social Drivers of Health   Financial Resource Strain: Low Risk  (07/13/2023)   Overall Financial Resource Strain (CARDIA)    Difficulty of Paying Living Expenses: Not hard at all  Food Insecurity: No Food  Insecurity (07/13/2023)   Hunger Vital Sign    Worried About Running Out of Food in the Last Year: Never true    Ran Out of Food in the Last Year: Never true  Transportation Needs: No Transportation Needs (07/13/2023)   PRAPARE - Administrator, Civil Service (Medical): No    Lack of Transportation (Non-Medical): No  Physical Activity: Insufficiently Active (07/13/2023)   Exercise Vital Sign    Days of Exercise per Week: 2  days    Minutes of Exercise per Session: 10 min  Stress: No Stress Concern Present (07/13/2023)   [employer] of Occupational Health - Occupational Stress Questionnaire    Feeling of Stress: Not at all  Social Connections: Moderately Integrated (07/13/2023)   Social Connection and Isolation Panel    Frequency of Communication with Friends and Family: More than three times a week    Frequency of Social Gatherings with Friends and Family: Three times a week    Attends Religious Services: More than 4 times per year    Active Member of Clubs or Organizations: Yes    Attends Banker Meetings: More than 4 times per year    Marital Status: Widowed    Tobacco Counseling Counseling given: Not Answered   Clinical Intake:  Pre-visit preparation completed: Yes  Pain : No/denies pain Pain Score: 0-No pain     BMI - recorded: 25.69 Nutritional Status: BMI 25 -29 Overweight  How often do you need to have someone help you when you read instructions, pamphlets, or other written materials from your doctor or pharmacy?: 5 - Always  Interpreter Needed?: No  Information entered by :: Kathlynn Porto, CMA   Activities of Daily Living    07/14/2023   10:48 AM 07/13/2023   12:31 PM  In your present state of health, do you have any difficulty performing the following activities:  Hearing? 1 1  Vision? 1 1  Difficulty concentrating or making decisions? 0 0  Walking or climbing stairs? 1 1  Dressing or bathing? 0 0  Doing errands, shopping? 1  1  Preparing Food and eating ? N N  Using the Toilet? N N  In the past six months, have you accidently leaked urine? N N  Do you have problems with loss of bowel control? N N  Managing your Medications? N N  Managing your Finances? N N  Housekeeping or managing your Housekeeping? N N    Patient Care Team: Perri Ronal PARAS, MD as PCP - General (Internal Medicine)  Indicate any recent Medical Services you may have received from other than [facility] providers in the past year (date may be approximate).     Assessment:   This is a routine wellness examination for [facility].  Hearing/Vision screen No results found.   Goals Addressed   None    Depression Screen    05/05/2023   10:41 AM 07/01/2022    2:49 PM 06/28/2021    3:05 PM 06/25/2020    3:42 PM 06/20/2019    2:14 PM 06/18/2018   11:00 AM 06/12/2017   11:02 AM  PHQ 2/9 Scores  PHQ - 2 Score 0 0 0 0 0 0 0    Fall Risk    07/14/2023   10:49 AM 07/13/2023   12:31 PM 05/05/2023   10:36 AM 04/03/2023    1:56 PM 07/01/2022    2:49 PM  Fall Risk   Falls in the past year? 0 0 0 0 1  Number falls in past yr: 0 0 0  1  Injury with Fall? 0 0 0  1  Risk for fall due to : No Fall Risks  Impaired vision  Impaired balance/gait;History of fall(s)  Follow up Falls prevention discussed;Education provided;Falls evaluation completed  Falls evaluation completed      MEDICARE RISK AT HOME: Medicare Risk at Home Any stairs in or around the home?: Yes If so, are there any without handrails?: No Home free of loose throw  rugs in walkways, pet beds, electrical cords, etc?: Yes Adequate lighting in your home to reduce risk of falls?: Yes Life alert?: Yes Use of a cane, walker or w/c?: No Grab bars in the bathroom?: Yes Shower chair or bench in shower?: Yes Elevated toilet seat or a handicapped toilet?: No  TIMED UP AND GO:  Was the test performed?  No    Cognitive Function:        07/14/2023   10:59 AM 07/01/2022    2:49 PM 06/28/2021    3:06 PM  06/18/2018   11:00 AM  6CIT Screen  What Year? 0 points 0 points 0 points 0 points  What month? 0 points 0 points 0 points 0 points  What time? 0 points 0 points 0 points 0 points  Count back from 20 0 points 0 points 0 points 0 points  Months in reverse 0 points 0 points 0 points 0 points  Repeat phrase 0 points 0 points 0 points 0 points  Total Score 0 points 0 points 0 points 0 points    Immunizations Immunization History  Administered Date(s) Administered   Fluad Quad(high Dose 65+) 09/10/2021   Influenza Split 09/27/2010, 10/05/2011   Influenza,inj,Quad PF,6+ Mos 10/05/2012, 10/23/2013, 09/25/2014, 09/11/2015, 10/13/2016, 09/29/2017, 09/12/2018, 09/12/2019   Influenza-Unspecified 09/27/2010, 10/05/2011, 09/25/2020, 09/19/2022   PFIZER(Purple Top)SARS-COV-2 Vaccination 01/24/2019, 02/14/2019, 10/31/2019   PNEUMOCOCCAL CONJUGATE-20 09/10/2021   Pfizer Covid-19 Vaccine Bivalent Booster 48yrs & up 09/25/2020   Pfizer(Comirnaty)Fall Seasonal Vaccine 12 years and older 10/11/2021   Pneumococcal Conjugate-13 11/06/2013   Pneumococcal Polysaccharide-23 02/17/2009   Respiratory Syncytial Virus Vaccine,Recomb Aduvanted(Arexvy) 10/18/2021   Tdap 04/26/1996, 08/16/2011, 06/20/2019   Zoster Recombinant(Shingrix) 06/14/2017, 08/23/2017   Zoster, Live 07/29/2008    TDAP status: Up to date  Flu Vaccine status: Up to date  Pneumococcal vaccine status: Up to date  Covid-19 vaccine status: Information provided on how to obtain vaccines.   Qualifies for Shingles Vaccine? Yes   Zostavax completed Yes   Shingrix Completed?: Yes  Screening Tests Health Maintenance  Topic Date Due   DEXA SCAN  07/11/2022   COVID-19 Vaccine (6 - 2024-25 season) 07/30/2023 (Originally 09/04/2022)   MAMMOGRAM  07/26/2023   INFLUENZA VACCINE  08/04/2023   Medicare Annual Wellness (AWV)  07/12/2024   DTaP/Tdap/Td (4 - Td or Tdap) 06/19/2029   Pneumococcal Vaccine: 50+ Years  Completed   Zoster Vaccines-  Shingrix  Completed   Hepatitis B Vaccines  Aged Out   HPV VACCINES  Aged Out   Meningococcal B Vaccine  Aged Out    Health Maintenance  Health Maintenance Due  Topic Date Due   DEXA SCAN  07/11/2022    Colorectal cancer screening: No longer required.   Mammogram status: Completed 07/26/2023. Repeat every year  Bone Density status: Completed 07/10/20. Results reflect: Bone density results: OSTEOPENIA. Repeat every 2 years.  Lung Cancer Screening: (Low Dose CT Chest recommended if Age 49-80 years, 20 pack-year currently smoking OR have quit w/in 15years.) does not qualify.   Lung Cancer Screening Referral:   Additional Screening:  Hepatitis C Screening: does not qualify; Completed   Vision Screening: Recommended annual ophthalmology exams for early detection of glaucoma and other disorders of the eye. Is the patient up to date with their annual eye exam?  Yes   If pt is not established with a provider, would they like to be referred to a provider to establish care? No .   Dental Screening: Recommended annual dental exams for proper oral  hygiene   Community Resource Referral / Chronic Care Management: CRR required this visit?  No   CCM required this visit?  No     Plan:     I have personally reviewed and noted the following in the patient's chart:   Medical and social history Use of alcohol, tobacco or illicit drugs  Current medications and supplements including opioid prescriptions. Patient is not currently taking opioid prescriptions. Functional ability and status Nutritional status Physical activity Advanced directives List of other physicians Hospitalizations, surgeries, and ER visits in previous 12 months Vitals Screenings to include cognitive, depression, and falls Referrals and appointments  In addition, I have reviewed and discussed with patient certain preventive protocols, quality metrics, and best practice recommendations. A written personalized  care plan for preventive services as well as general preventive health recommendations were provided to patient.     Avangeline Stockburger Zelda, CMA   07/14/2023   After Visit Summary: (In Person-Printed) AVS printed and given to the patient

## 2023-07-14 NOTE — Progress Notes (Signed)
 Annual Wellness Visit   Patient Care Team: [name], MD as PCP - General (Internal Medicine)  Visit Date: 07/14/23   Chief Complaint  Patient presents with   Medicare Wellness    Right foot pain, off and on gout?    Subjective:  Patient: Sabrina English, Female DOB: September 21, 1933, 89 y.o. MRN: [id number]  Sabrina English is a 89 y.o. Female who presents today for her Annual Wellness Visit. She is also here for evaluation of medical issues.She is here with her daughter, who helps her travel to appointments since pt quit driving due to Bilateral Macular Degeneration. Patient has Meniere disease; Hyperlipidemia; GE reflux; Osteopenia; Macular degeneration of both eyes; Pulmonary nodules; Hyperthyroidism; Renal insufficiency; Anemia associated with chronic renal failure; Multifocal pneumonia; Acute hypoxemic respiratory failure (HCC); LBBB (left bundle branch block); Hypokalemia; and Hyponatremia on their problem list.  Today she c/o of intermittent Right Foot Pain w/ associated red and swelling. Denies any recent insect bites. She says that last year she experienced a similar issue, which she did not come in to be seen for but reportedly resolved on its own, has been using ice.  Mentions she has had 1 syncopal episode, which she cannot attribute to any factors such as dehydration, nor insufficient calorie intake, but her daughter notes that she had stepped backwards and that seems to have a tendency to put her off-balance.   History of Hyperlipidemia with 07/13/2023 Lipid Panel: WNL. Not currently on medication.   History of Hyperthyroidism treated with Tapazole  5 mg 6x/ week. 07/13/2023 TSH: 2.22.  History of Chronic Kidney Disease, stage IIIb followed by [facility], seen 04/05/2023. Takes Ferrous Sulfate  325 mg daily. 07/13/2023 BUN 36, elevated from 96 in 06/2022; Creatinine 1.72, elevated from 1.44 in 06/2022; ALT 39, elevated from 17 in 06/2022.    History of  Mnire's Disease treated with Dyazide 37.5 - 25 mg daily, Meclizine  25 mg three times daily as needed for vertigo, and Zofran  as needed for nausea. Followed by [facility].   History of GERD treated with Famotidine  40 mg twice daily   Labs 07/13/2023 CBC: WNL CMP: WNL except for BUN 36, Creatinine 1.72; ALT 39.  No hx of abnormal PAP Smear.  Mammogram 07/26/2022 normal with repeat recommendation of 2025.  Colonoscopy 09/24/2015 with perianal & digital rectal examinations normal; Diverticula were found in the sigmoid colon; Associated marked tortuosity, and so retroflexion in the rectum was not performed due to anatomy; exam was otherwise normal.  Bone Density overdue since 2024, and T-score was -1.8 in 2022 - discussed, pt declines. History of Osteopenia managed with combined Calcium -Vitamin D  supplement and Vitamin-D3 supplement.   Vaccine Counseling: Due for Covid-19 - discussed, postponed; UTD on Flu, PNA, and Tdap. Past Medical History:  Diagnosis Date   Cancer (HCC)    melanoma l shoulder   GERD (gastroesophageal reflux disease)    Hyperlipidemia    Macular degeneration    Melanoma (HCC)    Meniere's disease    Osteoporosis    osteopenia   Thyroid  disease    Medical/Surgical History Narrative:  Allergic/Intolerant to: Levaquin /Levofloxacin  in D5w - LE pain  Past Surgical History:  Procedure Laterality Date   AXILLARY NODE DISSECTION  1983   BUNIONECTOMY     CATARACT Bilateral    MELANOMA EXCISION     left shoulder   Family History  Problem Relation Age of Onset   Mental illness Mother    Hyperlipidemia Mother    Cancer Father  Asthma Father    Cancer Sister    Hypothyroidism Brother    Allergic rhinitis Neg Hx    Angioedema Neg Hx    Atopy Neg Hx    Eczema Neg Hx    Immunodeficiency Neg Hx    Urticaria Neg Hx    Colon cancer Neg Hx    Esophageal cancer Neg Hx    Pancreatic cancer Neg Hx    Stomach cancer Neg Hx    Social Hx: widow- resides alone. Daughter is  with her today.  Review of Systems  Constitutional:  Negative for chills, fever, malaise/fatigue and weight loss.  HENT:  Negative for hearing loss, sinus pain and sore throat.   Respiratory:  Negative for cough, hemoptysis and shortness of breath.   Cardiovascular:  Negative for chest pain, palpitations, leg swelling and PND.  Gastrointestinal:  Negative for abdominal pain, constipation, diarrhea, heartburn, nausea and vomiting.  Genitourinary:  Negative for dysuria, frequency and urgency.  Musculoskeletal:  Negative for back pain, myalgias and neck pain.       (+) Foot Pain  Skin:  Negative for itching and rash.  Neurological:  Positive for loss of consciousness. Negative for dizziness, tingling, seizures and headaches.  Endo/Heme/Allergies:  Negative for polydipsia.  Psychiatric/Behavioral:  Negative for depression. The patient is not nervous/anxious.   All other systems reviewed and are negative.   Objective:  Vitals: BP 110/80   Pulse 68   Ht 5' 3 (1.6 m)   Wt 145 lb (65.8 kg)   SpO2 98%   BMI 25.69 kg/m  Physical Exam Vitals and nursing note reviewed.  Constitutional:      General: She is not in acute distress.    Appearance: Normal appearance. She is not ill-appearing or toxic-appearing.  HENT:     Head: Normocephalic and atraumatic.     Right Ear: Hearing, tympanic membrane, ear canal and external ear normal.     Left Ear: Hearing, tympanic membrane, ear canal and external ear normal.     Mouth/Throat:     Pharynx: Oropharynx is clear.  Eyes:     Extraocular Movements: Extraocular movements intact.     Pupils: Pupils are equal, round, and reactive to light.  Neck:     Thyroid : No thyroid  mass, thyromegaly or thyroid  tenderness.     Vascular: No carotid bruit.  Cardiovascular:     Rate and Rhythm: Normal rate and regular rhythm. No extrasystoles are present.    Pulses:          Dorsalis pedis pulses are 2+ on the right side and 2+ on the left side.     Heart  sounds: Normal heart sounds. No murmur heard.    No friction rub. No gallop.  Pulmonary:     Effort: Pulmonary effort is normal.     Breath sounds: Normal breath sounds. No decreased breath sounds, wheezing, rhonchi or rales.  Chest:     Chest wall: No mass.  Abdominal:     Palpations: Abdomen is soft. There is no hepatomegaly, splenomegaly or mass.     Tenderness: There is no abdominal tenderness.     Hernia: No hernia is present.  Musculoskeletal:     Cervical back: Normal range of motion.     Right lower leg: Pitting Edema (trace) present.     Left lower leg: No edema.  Lymphadenopathy:     Cervical: No cervical adenopathy.     Upper Body:     Right upper body: No supraclavicular adenopathy.  Left upper body: No supraclavicular adenopathy.  Skin:    General: Skin is warm and dry.     Comments: Ride-side of right foot red, warm, some pitting edema of foot and ankle. No pain on palpation  Seborrheic keratoses visualized  Neurological:     General: No focal deficit present.     Mental Status: She is alert and oriented to person, place, and time. Mental status is at baseline.     Sensory: Sensation is intact.     Motor: Motor function is intact. No weakness.     Deep Tendon Reflexes: Reflexes are normal and symmetric.  Psychiatric:        Attention and Perception: Attention normal.        Mood and Affect: Mood normal.        Speech: Speech normal.        Behavior: Behavior normal.        Thought Content: Thought content normal.        Cognition and Memory: Cognition normal.        Judgment: Judgment normal.   Most Recent Functional Status Assessment:    07/14/2023   10:48 AM  In your present state of health, do you have any difficulty performing the following activities:  Hearing? 1  Vision? 1  Difficulty concentrating or making decisions? 0  Walking or climbing stairs? 1  Dressing or bathing? 0  Doing errands, shopping? 1  Preparing Food and eating ? N  Using the  Toilet? N  In the past six months, have you accidently leaked urine? N  Do you have problems with loss of bowel control? N  Managing your Medications? N  Managing your Finances? N  Housekeeping or managing your Housekeeping? N   Most Recent Fall Risk Assessment:    07/14/2023   10:49 AM  Fall Risk   Falls in the past year? 0  Number falls in past yr: 0  Injury with Fall? 0  Risk for fall due to : No Fall Risks  Follow up Falls prevention discussed;Education provided;Falls evaluation completed   Most Recent Depression Screenings:    05/05/2023   10:41 AM 07/01/2022    2:49 PM  PHQ 2/9 Scores  PHQ - 2 Score 0 0   Most Recent Cognitive Screening:    07/14/2023   10:59 AM  6CIT Screen  What Year? 0 points  What month? 0 points  What time? 0 points  Count back from 20 0 points  Months in reverse 0 points  Repeat phrase 0 points  Total Score 0 points   Results:  Studies Obtained And Personally Reviewed By Me:  Mammogram 07/26/2022 normal with repeat recommendation of 2025.  Colonoscopy 09/24/2015 with perianal & digital rectal examinations normal; Diverticula were found in the sigmoid colon; Associated marked tortuosity, and so retroflexion in the rectum was not performed due to anatomy; exam was otherwise normal.  Bone Density overdue since 2024, and T-score was -1.8 in 2022 - discussed, pt declines.  Labs:     Component Value Date/Time   NA 142 07/13/2023 1015   K 4.1 07/13/2023 1015   CL 105 07/13/2023 1015   CO2 26 07/13/2023 1015   GLUCOSE 96 07/13/2023 1015   BUN 36 (H) 07/13/2023 1015   CREATININE 1.72 (H) 07/13/2023 1015   CALCIUM  10.0 07/13/2023 1015   PROT 6.3 07/13/2023 1015   ALBUMIN 2.2 (L) 01/12/2023 0549   AST 31 07/13/2023 1015   ALT 39 (H) 07/13/2023 1015  ALKPHOS 103 01/12/2023 0549   BILITOT 0.8 07/13/2023 1015   GFRNONAA 39 (L) 01/13/2023 0556   GFRNONAA 35 (L) 06/23/2020 1111   GFRAA 40 (L) 06/23/2020 1111    Lab Results  Component Value  Date   WBC 5.5 07/13/2023   HGB 12.8 07/13/2023   HCT 39.8 07/13/2023   MCV 91.3 07/13/2023   PLT 240 07/13/2023   Lab Results  Component Value Date   CHOL 193 07/13/2023   HDL 91 07/13/2023   LDLCALC 83 07/13/2023   TRIG 91 07/13/2023   CHOLHDL 2.1 07/13/2023   Lab Results  Component Value Date   TSH 2.22 07/13/2023    Assessment & Plan:   Orders Placed This Encounter  Procedures   Uric acid   Meds ordered this encounter  Medications   cyclobenzaprine  (FLEXERIL ) 10 MG tablet    Sig: Take 1 tablet (10 mg total) by mouth at bedtime.    Dispense:  30 tablet    Refill:  1   meclizine  (ANTIVERT ) 25 MG tablet    Sig: Take 1 tablet (25 mg total) by mouth 3 (three) times daily as needed.    Dispense:  30 tablet    Refill:  11   doxycycline  (VIBRA -TABS) 100 MG tablet    Sig: Take 1 tablet (100 mg total) by mouth 2 (two) times daily.    Dispense:  14 tablet    Refill:  0  Other Labs Reviewed today: CBC: WNL CMP: WNL except for BUN 36, Creatinine 1.72; ALT 39.   Intermittent Right Foot Pain w/ associated red and swelling. Denies any recent insect bites. She says that last year she experienced a similar issue, which she did not come in to be seen for but reportedly resolved on its own, has been using ice. Erythema visualized on right side of foot extending to 5th toe with some trace pitting edema of foot/ankle, however no pain on palpation of erythematous area. Ordering uric acid. Sending in 100 mg Doxycycline  - take 1 tablet (100 mg total) by mouth 2 (two) times daily to treat possible cellulitis.   Syncope, which she cannot attribute to any factors such as dehydration, nor insufficient calorie intake, but her daughter notes that she had stepped backwards and that seems to have a tendency to put her off-balance.   Hyperlipidemia with 07/13/2023 Lipid Panel: WNL. Not currently on medication.   Hyperthyroidism treated with Tapazole  5 mg 6x/ week. 07/13/2023 TSH: 2.22.  Chronic  Kidney Disease, stage IIIb followed by [facility], seen 04/05/2023. Takes Ferrous Sulfate  325 mg daily. 07/13/2023 BUN 36, elevated from 96 in 06/2022; Creatinine 1.72, elevated from 1.44 in 06/2022; ALT 39, elevated from 17 in 06/2022.    Mnire's Disease treated with Dyazide 37.5 - 25 mg daily, Meclizine  25 mg three times daily as needed for vertigo, and Zofran  as needed for nausea. Followed by [facility]. Refilling Meclizine .   GERD treated with Famotidine  40 mg twice daily   No hx of abnormal PAP Smear.  Mammogram 07/26/2022 normal with repeat recommendation of 2025.  Colonoscopy 09/24/2015 with perianal & digital rectal examinations normal; Diverticula were found in the sigmoid colon; Associated marked tortuosity, and so retroflexion in the rectum was not performed due to anatomy; exam was otherwise normal.  Bone Density overdue since 2024, and T-score was -1.8 in 2022 - discussed, pt declines.   Osteopenia managed with combined Calcium -Vitamin D  supplement and Vitamin-D3 supplement.   Vaccine Counseling: Due for Covid-19 - discussed, postponed; UTD  on Flu, PNA, and Tdap.  Return in about 27 weeks (around 01/19/2024) for 6 month follow-up, or as needed.   Annual wellness visit done today including the all of the following: Reviewed patient's Family Medical History Reviewed and updated list of patient's medical providers Assessment of cognitive impairment was done Assessed patient's functional ability Established a written schedule for health screening services Health Risk Assessent Completed and Reviewed  Discussed health benefits of physical activity, and encouraged her to engage in regular exercise appropriate for her age and condition.    I,Emily Lagle,acting as a Neurosurgeon for Sabrina JINNY Hailstone, MD.,have documented all relevant documentation on the behalf of Sabrina JINNY Hailstone, MD,as directed by  Sabrina JINNY Hailstone, MD while in the presence of Sabrina JINNY Hailstone, MD.   I, Sabrina JINNY Hailstone, MD,  have reviewed all documentation for this visit. The documentation on 07/28/23 for the exam, diagnosis, procedures, and orders are all accurate and complete.

## 2023-07-15 LAB — TEST AUTHORIZATION

## 2023-07-15 LAB — CBC WITH DIFFERENTIAL/PLATELET
Absolute Lymphocytes: 1342 {cells}/uL (ref 850–3900)
Absolute Monocytes: 655 {cells}/uL (ref 200–950)
Basophils Absolute: 22 {cells}/uL (ref 0–200)
Basophils Relative: 0.4 %
Eosinophils Absolute: 143 {cells}/uL (ref 15–500)
Eosinophils Relative: 2.6 %
HCT: 39.8 % (ref 35.0–45.0)
Hemoglobin: 12.8 g/dL (ref 11.7–15.5)
MCH: 29.4 pg (ref 27.0–33.0)
MCHC: 32.2 g/dL (ref 32.0–36.0)
MCV: 91.3 fL (ref 80.0–100.0)
MPV: 10.1 fL (ref 7.5–12.5)
Monocytes Relative: 11.9 %
Neutro Abs: 3339 {cells}/uL (ref 1500–7800)
Neutrophils Relative %: 60.7 %
Platelets: 240 Thousand/uL (ref 140–400)
RBC: 4.36 Million/uL (ref 3.80–5.10)
RDW: 12.2 % (ref 11.0–15.0)
Total Lymphocyte: 24.4 %
WBC: 5.5 Thousand/uL (ref 3.8–10.8)

## 2023-07-15 LAB — COMPLETE METABOLIC PANEL WITHOUT GFR
AG Ratio: 2 (calc) (ref 1.0–2.5)
ALT: 39 U/L — ABNORMAL HIGH (ref 6–29)
AST: 31 U/L (ref 10–35)
Albumin: 4.2 g/dL (ref 3.6–5.1)
Alkaline phosphatase (APISO): 100 U/L (ref 37–153)
BUN/Creatinine Ratio: 21 (calc) (ref 6–22)
BUN: 36 mg/dL — ABNORMAL HIGH (ref 7–25)
CO2: 26 mmol/L (ref 20–32)
Calcium: 10 mg/dL (ref 8.6–10.4)
Chloride: 105 mmol/L (ref 98–110)
Creat: 1.72 mg/dL — ABNORMAL HIGH (ref 0.60–0.95)
Globulin: 2.1 g/dL (ref 1.9–3.7)
Glucose, Bld: 96 mg/dL (ref 65–99)
Potassium: 4.1 mmol/L (ref 3.5–5.3)
Sodium: 142 mmol/L (ref 135–146)
Total Bilirubin: 0.8 mg/dL (ref 0.2–1.2)
Total Protein: 6.3 g/dL (ref 6.1–8.1)

## 2023-07-15 LAB — LIPID PANEL
Cholesterol: 193 mg/dL (ref <200)
HDL: 91 mg/dL (ref >=50)
LDL Cholesterol (Calc): 83 mg/dL
Non-HDL Cholesterol (Calc): 102 mg/dL (ref <130)
Total CHOL/HDL Ratio: 2.1 (calc) (ref <5.0)
Triglycerides: 91 mg/dL (ref <150)

## 2023-07-15 LAB — TSH: TSH: 2.22 m[IU]/L (ref 0.40–4.50)

## 2023-07-15 LAB — URIC ACID: Uric Acid, Serum: 8.8 mg/dL — ABNORMAL HIGH (ref 2.5–7.0)

## 2023-07-17 NOTE — Telephone Encounter (Signed)
 Patient has questions regarding her lab results and if she should continue with the antibiotics prescribed last week. Please call daughter Camie [phone].

## 2023-08-01 DIAGNOSIS — Z1231 Encounter for screening mammogram for malignant neoplasm of breast: Secondary | ICD-10-CM | POA: Diagnosis not present

## 2023-08-01 LAB — HM MAMMOGRAPHY

## 2023-08-04 ENCOUNTER — Encounter: Payer: Self-pay | Admitting: Internal Medicine

## 2023-08-07 DIAGNOSIS — H353211 Exudative age-related macular degeneration, right eye, with active choroidal neovascularization: Secondary | ICD-10-CM | POA: Diagnosis not present

## 2023-08-07 DIAGNOSIS — H353124 Nonexudative age-related macular degeneration, left eye, advanced atrophic with subfoveal involvement: Secondary | ICD-10-CM | POA: Diagnosis not present

## 2023-08-14 ENCOUNTER — Encounter: Payer: Self-pay | Admitting: Internal Medicine

## 2023-08-14 ENCOUNTER — Ambulatory Visit (INDEPENDENT_AMBULATORY_CARE_PROVIDER_SITE_OTHER): Payer: Medicare Other | Admitting: Internal Medicine

## 2023-08-14 VITALS — BP 104/68 | HR 64 | Ht 63.0 in | Wt 143.0 lb

## 2023-08-14 DIAGNOSIS — E0590 Thyrotoxicosis, unspecified without thyrotoxic crisis or storm: Secondary | ICD-10-CM | POA: Diagnosis not present

## 2023-08-14 DIAGNOSIS — N1832 Chronic kidney disease, stage 3b: Secondary | ICD-10-CM | POA: Diagnosis not present

## 2023-08-14 MED ORDER — METHIMAZOLE 5 MG PO TABS: 5.0000 mg | ORAL_TABLET | ORAL | 3 refills | Status: AC

## 2023-08-14 NOTE — Progress Notes (Signed)
 Name: Sabrina English  MRN/ DOB: [id number], 07-Dec-1933    Age/ Sex: 89 y.o., female     PCP: Perri Ronal PARAS, MD   Reason for Endocrinology Evaluation: hyperthyroidism     Initial Endocrinology Clinic Visit: 02/27/2019    PATIENT IDENTIFIER: Sabrina English is a 89 y.o., female with a past medical history of hyperthyriodism, GERD, pulmonary nodules . She has followed with [facility] since 02/27/2019 for consultative assistance with management of her hyperthyroidism.   HISTORICAL SUMMARY: The patient was first diagnosed with hyperthyroidism in 2013, with a nadir of 0.20 uIU/mL in 2018  but was not started on any thionamides therapy until August 2020.  The reason for treatment was symptomatic and osteopenia   Mother with thyroid  disease ( hypothyroidism )  SUBJECTIVE:    Today (08/14/2023):  Sabrina English is here for a follow-up on hyperthyroidism.  She is accompanied by her daughter today.   Patient follows with ophthalmology for macular degeneration Patient has been noted with weight loss since her last visit here, no change in appetite  She follows with Dr. Dolan  She doesn't stay hydrated  Denies local neck swelling  Denies palpitations  Denies constipation or diarrhea    Methimazole  5 mg , 1 tablet  Monday through Saturdays    HISTORY:  Past Medical History:  Past Medical History:  Diagnosis Date   Cancer (HCC)    melanoma l shoulder   GERD (gastroesophageal reflux disease)    Hyperlipidemia    Macular degeneration    Melanoma (HCC)    Meniere's disease    Osteoporosis    osteopenia   Thyroid  disease    Past Surgical History:  Past Surgical History:  Procedure Laterality Date   AXILLARY NODE DISSECTION  1983   BUNIONECTOMY     CATARACT Bilateral    MELANOMA EXCISION     left shoulder   Social History:  reports that she has never smoked. She has never used smokeless tobacco. She reports that she does not drink alcohol and  does not use drugs. Family History:  Family History  Problem Relation Age of Onset   Mental illness Mother    Hyperlipidemia Mother    Cancer Father    Asthma Father    Cancer Sister    Hypothyroidism Brother    Allergic rhinitis Neg Hx    Angioedema Neg Hx    Atopy Neg Hx    Eczema Neg Hx    Immunodeficiency Neg Hx    Urticaria Neg Hx    Colon cancer Neg Hx    Esophageal cancer Neg Hx    Pancreatic cancer Neg Hx    Stomach cancer Neg Hx      HOME MEDICATIONS: Allergies as of 08/14/2023       Reactions   Levaquin  [levofloxacin  In D5w] Other (See Comments)   Lower extremity pain         Medication List        Accurate as of August 14, 2023 12:54 PM. If you have any questions, ask your nurse or doctor.          calcium -vitamin D  500-200 MG-UNIT tablet Commonly known as: OSCAL WITH D Take 1 tablet by mouth daily.   cholecalciferol  25 MCG (1000 UNIT) tablet Commonly known as: VITAMIN D3 Take 1,000 Units by mouth daily.   cyclobenzaprine  10 MG tablet Commonly known as: FLEXERIL  Take 1 tablet (10 mg total) by mouth at bedtime.   dicyclomine  20 MG  tablet Commonly known as: BENTYL  Take 1 tablet (20 mg total) by mouth daily.   doxycycline  100 MG tablet Commonly known as: VIBRA -TABS Take 1 tablet (100 mg total) by mouth 2 (two) times daily.   famotidine  40 MG tablet Commonly known as: PEPCID  Take 1 tablet (40 mg total) by mouth in the morning and at bedtime.   ferrous sulfate  325 (65 FE) MG EC tablet Take 325 mg by mouth daily with breakfast.   fluticasone 50 MCG/ACT nasal spray Commonly known as: FLONASE Place 2 sprays into both nostrils daily.   ipratropium 0.06 % nasal spray Commonly known as: ATROVENT  USE 2 SPRAYS IN BOTH NOSTRILS  EVERY 6 HOURS IF NEEDED TO DRY  UP NOSE What changed:  how much to take how to take this when to take this additional instructions   latanoprost  0.005 % ophthalmic solution Commonly known as: XALATAN  Place 1  drop into both eyes at bedtime.   LIPOFLAVONOID PO Take 1 tablet by mouth 2 (two) times daily.   meclizine  25 MG tablet Commonly known as: ANTIVERT  Take 1 tablet (25 mg total) by mouth 3 (three) times daily as needed.   methimazole  5 MG tablet Commonly known as: TAPAZOLE  Take 1 tablet (5 mg total) by mouth as directed. 1 tablet Monday through Saturday and none on Sundays   ondansetron  4 MG disintegrating tablet Commonly known as: ZOFRAN -ODT Take 4 mg by mouth every 6 (six) hours as needed.   PRESCRIPTION MEDICATION Take 8 mg by mouth daily. Betahistine Dihydrochloride 8 mg   PRESCRIPTION MEDICATION Place 1 spray into the nose daily. Unknown nasal spray   PRESERVISION AREDS PO Take 1 tablet by mouth in the morning and at bedtime.   simvastatin  20 MG tablet Commonly known as: ZOCOR  Take 20 mg by mouth at bedtime.   triamterene -hydrochlorothiazide 37.5-25 MG capsule Commonly known as: DYAZIDE TAKE 1 CAPSULE BY MOUTH EVERY DAY   valACYclovir 1000 MG tablet Commonly known as: VALTREX Take 2,000 mg by mouth 2 (two) times daily.          OBJECTIVE:   PHYSICAL EXAM: VS: BP 104/68 (BP Location: Left Arm, Patient Position: Sitting, Cuff Size: Normal)   Pulse 64   Ht 5' 3 (1.6 m)   Wt 143 lb (64.9 kg)   BMI 25.33 kg/m     Filed Weights   08/14/23 1250  Weight: 143 lb (64.9 kg)     EXAM: General: Pt appears well and is in NAD  Neck: General: Supple without adenopathy. Thyroid : Thyroid  size normal.  No goiter or nodules appreciated.  Lungs: Clear with good BS bilat   Heart: Auscultation: RRR.  Extremities:  BL LE: No pretibial edema   Mental Status: Judgment, insight: Intact Orientation: Oriented to time, place, and person Mood and affect: No depression, anxiety, or agitation     DATA REVIEWED:   Latest Reference Range & Units 08/14/23 13:09  Sodium 135 - 146 mmol/L 141  Potassium 3.5 - 5.3 mmol/L 3.9  Chloride 98 - 110 mmol/L 105  CO2 20 - 32  mmol/L 26  Glucose 65 - 99 mg/dL 94  BUN 7 - 25 mg/dL 29 (H)  Creatinine 9.39 - 0.95 mg/dL 8.38 (H)  Calcium  8.6 - 10.4 mg/dL 9.6  BUN/Creatinine Ratio 6 - 22 (calc) 18  eGFR > OR = 60 mL/min/1.34m2 31 (L)  (    Latest Reference Range & Units 07/13/23 10:15  Sodium 135 - 146 mmol/L 142  Potassium 3.5 - 5.3 mmol/L 4.1  Chloride 98 - 110 mmol/L 105  CO2 20 - 32 mmol/L 26  Glucose 65 - 99 mg/dL 96  BUN 7 - 25 mg/dL 36 (H)  Creatinine 9.39 - 0.95 mg/dL 8.27 (H)  Calcium  8.6 - 10.4 mg/dL 89.9  BUN/Creatinine Ratio 6 - 22 (calc) 21  AG Ratio 1.0 - 2.5 (calc) 2.0  Uric Acid, Serum 2.5 - 7.0 mg/dL 8.8 (H)  AST 10 - 35 U/L 31  ALT 6 - 29 U/L 39 (H)  Total Protein 6.1 - 8.1 g/dL 6.3  Total Bilirubin 0.2 - 1.2 mg/dL 0.8    Latest Reference Range & Units 07/13/23 10:15  TSH 0.40 - 4.50 mIU/L 2.22      ASSESSMENT / PLAN / RECOMMENDATIONS:   Hyperthyroidism:   - She has been on methimazole  without side effects  - She is clinically euthyroid  - No local neck symptoms  -TFTs show low TSH, will adjust methimazole  as below -Recheck in 2 months  Medications   Take methimazole  5 mg 1 tablet Monday through Saturday and none on  Sundays   2. CKD III  - Noted worsening elevation of BUN/creatinine on last months BMP - BUNs/creatinine has been trending down - Encouraged hydration - Patient follows with nephrology   F/U in 6 months   Signed electronically by: Stefano Redgie Butts, MD  [facility] Endocrinology  [facility] [address] [place] [place], [place] [address] Phone: [phone] FAX: [phone]      CC: Perri Ronal PARAS, MD [address] [place] [address] Phone: [phone]  Fax: [phone]   Return to Endocrinology clinic as below: Future Appointments  Date Time Provider Department Center  08/14/2023  1:00 PM Scheryl Sanborn, Donell Redgie, MD [id number] None  01/19/2024 10:30 AM Baxley, Ronal PARAS, MD [id number]

## 2023-08-15 ENCOUNTER — Ambulatory Visit: Payer: Self-pay | Admitting: Internal Medicine

## 2023-08-15 DIAGNOSIS — N1832 Chronic kidney disease, stage 3b: Secondary | ICD-10-CM | POA: Insufficient documentation

## 2023-08-15 LAB — BASIC METABOLIC PANEL WITH GFR
BUN/Creatinine Ratio: 18 (calc) (ref 6–22)
BUN: 29 mg/dL — ABNORMAL HIGH (ref 7–25)
CO2: 26 mmol/L (ref 20–32)
Calcium: 9.6 mg/dL (ref 8.6–10.4)
Chloride: 105 mmol/L (ref 98–110)
Creat: 1.61 mg/dL — ABNORMAL HIGH (ref 0.60–0.95)
Glucose, Bld: 94 mg/dL (ref 65–99)
Potassium: 3.9 mmol/L (ref 3.5–5.3)
Sodium: 141 mmol/L (ref 135–146)
eGFR: 31 mL/min/1.73m2 — ABNORMAL LOW (ref >=60)

## 2023-08-20 ENCOUNTER — Other Ambulatory Visit: Payer: Self-pay | Admitting: Internal Medicine

## 2023-08-29 DIAGNOSIS — H401131 Primary open-angle glaucoma, bilateral, mild stage: Secondary | ICD-10-CM | POA: Diagnosis not present

## 2023-09-04 ENCOUNTER — Other Ambulatory Visit: Payer: Self-pay | Admitting: Physician Assistant

## 2023-09-11 DIAGNOSIS — Z23 Encounter for immunization: Secondary | ICD-10-CM | POA: Diagnosis not present

## 2023-09-17 DIAGNOSIS — Z23 Encounter for immunization: Secondary | ICD-10-CM | POA: Diagnosis not present

## 2023-10-04 DIAGNOSIS — H353114 Nonexudative age-related macular degeneration, right eye, advanced atrophic with subfoveal involvement: Secondary | ICD-10-CM | POA: Diagnosis not present

## 2023-10-11 DIAGNOSIS — H353124 Nonexudative age-related macular degeneration, left eye, advanced atrophic with subfoveal involvement: Secondary | ICD-10-CM | POA: Diagnosis not present

## 2023-11-02 DIAGNOSIS — H903 Sensorineural hearing loss, bilateral: Secondary | ICD-10-CM | POA: Diagnosis not present

## 2023-11-02 DIAGNOSIS — Z974 Presence of external hearing-aid: Secondary | ICD-10-CM | POA: Diagnosis not present

## 2023-11-02 DIAGNOSIS — H6123 Impacted cerumen, bilateral: Secondary | ICD-10-CM | POA: Diagnosis not present

## 2023-11-16 ENCOUNTER — Other Ambulatory Visit: Payer: Self-pay | Admitting: Physician Assistant

## 2023-12-07 ENCOUNTER — Telehealth: Payer: Self-pay | Admitting: Physician Assistant

## 2023-12-07 MED ORDER — DICYCLOMINE HCL 20 MG PO TABS: 20.0000 mg | ORAL_TABLET | Freq: Every day | ORAL | 3 refills | Status: AC

## 2023-12-07 NOTE — Telephone Encounter (Signed)
 Inbound call from patient daughter requesting a refill on her mother's Dicyclomine . Please advise.

## 2023-12-07 NOTE — Telephone Encounter (Signed)
 Script sent to pharmacy.

## 2023-12-13 DIAGNOSIS — H353124 Nonexudative age-related macular degeneration, left eye, advanced atrophic with subfoveal involvement: Secondary | ICD-10-CM | POA: Diagnosis not present

## 2024-01-10 LAB — LAB REPORT - SCANNED
Calcium: 9.6
EGFR: 29
PTH: 55

## 2024-01-11 NOTE — Progress Notes (Signed)
 "   Patient Care Team: Sabrina Sabrina PARAS, MD as PCP - General (Internal Medicine)  Visit Date: 01/19/24  Subjective:    Patient ID: Sabrina English , Female   DOB: 11-28-1933, 89 y.o.    MRN: [id number]   89 y.o. Female presents today for 6 Month follow up. Patient has a past medical history of Hyperlipidemia, Hyperthyroidism, Chronic Kidney disease, GE Reflux, Mnire's Disease.  History of Hyperlipidemia treated with Simvastatin  20 mg daily.    History of Hyperthyroidism treated with Tapazole  5 mg 6x/ week. 01/09/2024 PTH 55. Followed by Dr. Sam, Endocrinologist.    History of Chronic Kidney Disease, stage IIIb followed by [facility].  01/09/2024 BUN 29, Creatinine 1.69, eGFR 29 . She had an appointment with her nephrologist , Dr. Dolan on Monday             History of Mnire's Disease treated with Dyazide 37.5 - 25 mg daily, Meclizine  25 mg three times daily as needed for vertigo, and Zofran  as needed for nausea. Followed by [facility].   Macular degeneration followed by Dr. Tobie. No longer drives.  Hearing loss- wears hearing aid right ear- seen at [facility] on [address]   History of GE Reflux treated with Famotidine  40 mg twice daily   History of Vitamin D  Deficiency treated with Vitamin D3 1,000 units daily. 01/09/2024 Vitamin D  hydroxy 36.5.   History of Anemia; Recently discontinued iron supplements. 01/09/2024 Ferritin 529.    01/12/2023 ECG Mitral valve mild calcification of the anterior and posterior valve leaflets, Trivial mitral valve regurgitation.    Past Medical History:  Diagnosis Date   Cancer (HCC)    melanoma l shoulder   GERD (gastroesophageal reflux disease)    Hyperlipidemia    Macular degeneration    Melanoma (HCC)    Meniere's disease    Osteoporosis    osteopenia   Thyroid  disease      Family History  Problem Relation Age of Onset   Mental illness Mother    Hyperlipidemia Mother    Cancer Father    Asthma Father     Cancer Sister    Hypothyroidism Brother    Allergic rhinitis Neg Hx    Angioedema Neg Hx    Atopy Neg Hx    Eczema Neg Hx    Immunodeficiency Neg Hx    Urticaria Neg Hx    Colon cancer Neg Hx    Esophageal cancer Neg Hx    Pancreatic cancer Neg Hx    Stomach cancer Neg Hx     Social Hx: widow- resides alone. Daughter is with her today. 3 adult children, 2 daughters and a son. 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  Family history: Father died at age 46 of prostate cancer.  Mother died at age 29 of Alzheimer's disease complications.  1 sister died of colon cancer at age 64.  2 brothers.  Daughter has been diagnosed with pulmonary sarcoidosis and renal cell carcinoma   Review of Systems  All other systems reviewed and are negative.  No chest pain or SOB     Objective:   Vitals: BP 120/80   Pulse 62   Ht 5' 3 (1.6 m)   Wt 146 lb (66.2 kg)   SpO2 98%   BMI 25.86 kg/m    Physical Exam Vitals and nursing note reviewed.  Constitutional:      General: She is not in acute distress.    Appearance: Normal appearance.  She is not toxic-appearing.  HENT:     Head: Normocephalic and atraumatic.  Cardiovascular:     Rate and Rhythm: Normal rate and regular rhythm. No extrasystoles are present.    Pulses: Normal pulses.     Heart sounds: Murmur heard.     No friction rub. No gallop.  Pulmonary:     Effort: Pulmonary effort is normal. No respiratory distress.     Breath sounds: Normal breath sounds. No wheezing or rales.  Skin:    General: Skin is warm and dry.  Neurological:     Mental Status: She is alert and oriented to person, place, and time. Mental status is at baseline.  Psychiatric:        Mood and Affect: Mood normal.        Behavior: Behavior normal.        Thought Content: Thought content normal.        Judgment: Judgment normal.       Results:   Studies obtained and personally reviewed by me:  01/12/2023 ECG Mitral valve  mild calcification of the anterior and posterior valve leaflets, Trivial mitral valve regurgitation.    Labs:       Component Value Date/Time   NA 141 08/14/2023 1309   K 3.9 08/14/2023 1309   CL 105 08/14/2023 1309   CO2 26 08/14/2023 1309   GLUCOSE 94 08/14/2023 1309   BUN 29 (H) 08/14/2023 1309   CREATININE 1.61 (H) 08/14/2023 1309   CALCIUM  9.6 08/14/2023 1309   PROT 6.3 07/13/2023 1015   ALBUMIN 2.2 (L) 01/12/2023 0549   AST 31 07/13/2023 1015   ALT 39 (H) 07/13/2023 1015   ALKPHOS 103 01/12/2023 0549   BILITOT 0.8 07/13/2023 1015   GFRNONAA 39 (L) 01/13/2023 0556   GFRNONAA 35 (L) 06/23/2020 1111   GFRAA 40 (L) 06/23/2020 1111     Lab Results  Component Value Date   WBC 5.5 07/13/2023   HGB 12.8 07/13/2023   HCT 39.8 07/13/2023   MCV 91.3 07/13/2023   PLT 240 07/13/2023    Lab Results  Component Value Date   CHOL 193 07/13/2023   HDL 91 07/13/2023   LDLCALC 83 07/13/2023   TRIG 91 07/13/2023   CHOLHDL 2.1 07/13/2023    Lab Results  Component Value Date   TSH 2.22 07/13/2023        Assessment & Plan:   Hyperlipidemia: treated with Simvastatin  20 mg daily. Lipid panel in July was normal on statin   Hyperthyroidism: treated with Tapazole  5 mg 6x/ week. TSH in July  was 2.22 and stable   Chronic Kidney Disease, stage IIIb: followed by [facility].  01/09/2024 BUN 29, Creatinine 1.69, eGFR 29 . She has an appointment with her nephrologist  Dr. Dolan  this coming Monday             Mnire's Disease: treated with Dyazide 37.5 - 25 mg daily, Meclizine  25 mg three times daily as needed for vertigo, and Zofran  as needed for nausea. Follow[facility] by [facility].    GE Reflux: treated with Famotidine  40 mg twice daily   Vitamin D  Deficiency: treated with Vitamin D3 1,000 units daily. 01/09/2024 Vitamin D  hydroxy 36.5.   Anemia: Recently discontinued iron supplements. 01/09/2024 Ferritin 529.    01/12/2023 ECG Mitral valve  mild calcification of the anterior and posterior valve leaflets, Trivial mitral valve regurgitation.    Primary open angle glaucoma seen by Dr. Leslee  Remote  history of melanoma removed from left shoulder 1992.  Hx of osteopenia. Dexa scan 2022 lowest T score was -1.8     I,Makayla C Reid,acting as a scribe for Sabrina JINNY Hailstone, MD.,have documented all relevant documentation on the behalf of Sabrina JINNY Hailstone, MD,as directed by  Sabrina JINNY Hailstone, MD while in the presence of Sabrina JINNY Hailstone, MD.      "

## 2024-01-19 ENCOUNTER — Ambulatory Visit: Payer: Self-pay | Admitting: Internal Medicine

## 2024-01-19 ENCOUNTER — Encounter: Payer: Self-pay | Admitting: Internal Medicine

## 2024-01-19 VITALS — BP 120/80 | HR 62 | Ht 63.0 in | Wt 146.0 lb

## 2024-01-19 DIAGNOSIS — Z8719 Personal history of other diseases of the digestive system: Secondary | ICD-10-CM

## 2024-01-19 DIAGNOSIS — N1832 Chronic kidney disease, stage 3b: Secondary | ICD-10-CM | POA: Diagnosis not present

## 2024-01-19 DIAGNOSIS — H903 Sensorineural hearing loss, bilateral: Secondary | ICD-10-CM

## 2024-01-19 DIAGNOSIS — E785 Hyperlipidemia, unspecified: Secondary | ICD-10-CM | POA: Diagnosis not present

## 2024-01-19 DIAGNOSIS — K219 Gastro-esophageal reflux disease without esophagitis: Secondary | ICD-10-CM | POA: Diagnosis not present

## 2024-01-19 DIAGNOSIS — I350 Nonrheumatic aortic (valve) stenosis: Secondary | ICD-10-CM

## 2024-01-19 DIAGNOSIS — I3481 Nonrheumatic mitral (valve) annulus calcification: Secondary | ICD-10-CM

## 2024-01-19 DIAGNOSIS — E0590 Thyrotoxicosis, unspecified without thyrotoxic crisis or storm: Secondary | ICD-10-CM | POA: Diagnosis not present

## 2024-01-19 DIAGNOSIS — H8109 Meniere's disease, unspecified ear: Secondary | ICD-10-CM | POA: Diagnosis not present

## 2024-01-19 DIAGNOSIS — E559 Vitamin D deficiency, unspecified: Secondary | ICD-10-CM | POA: Diagnosis not present

## 2024-01-19 DIAGNOSIS — Z8659 Personal history of other mental and behavioral disorders: Secondary | ICD-10-CM

## 2024-01-19 DIAGNOSIS — M8580 Other specified disorders of bone density and structure, unspecified site: Secondary | ICD-10-CM

## 2024-01-19 DIAGNOSIS — Z85820 Personal history of malignant melanoma of skin: Secondary | ICD-10-CM

## 2024-01-19 DIAGNOSIS — H905 Unspecified sensorineural hearing loss: Secondary | ICD-10-CM

## 2024-01-19 DIAGNOSIS — D649 Anemia, unspecified: Secondary | ICD-10-CM | POA: Diagnosis not present

## 2024-01-19 DIAGNOSIS — H401190 Primary open-angle glaucoma, unspecified eye, stage unspecified: Secondary | ICD-10-CM

## 2024-01-19 DIAGNOSIS — H3530 Unspecified macular degeneration: Secondary | ICD-10-CM

## 2024-01-19 DIAGNOSIS — J302 Other seasonal allergic rhinitis: Secondary | ICD-10-CM

## 2024-01-19 NOTE — Patient Instructions (Addendum)
 It was a pleasure to see you today. Continue current meds and follow up for Medicare wellness visit and annual exam in 6 months. Labs are stable. No change in medications.

## 2024-01-30 ENCOUNTER — Other Ambulatory Visit: Payer: Self-pay | Admitting: Internal Medicine

## 2024-01-30 ENCOUNTER — Encounter: Payer: Self-pay | Admitting: Internal Medicine

## 2024-02-05 ENCOUNTER — Ambulatory Visit: Admitting: Internal Medicine

## 2024-04-24 ENCOUNTER — Ambulatory Visit: Admitting: Internal Medicine

## 2024-07-19 ENCOUNTER — Ambulatory Visit: Admitting: Internal Medicine
